# Patient Record
Sex: Male | Born: 1935 | Race: White | Hispanic: No | Smoking: Former smoker
Health system: Southern US, Community
[De-identification: ages and names within clinical notes are randomized; demographics above are authoritative.]

## PROBLEM LIST (undated history)

## (undated) DIAGNOSIS — I4892 Unspecified atrial flutter: Secondary | ICD-10-CM

## (undated) DIAGNOSIS — F32A Depression, unspecified: Secondary | ICD-10-CM

## (undated) DIAGNOSIS — D696 Thrombocytopenia, unspecified: Secondary | ICD-10-CM

## (undated) DIAGNOSIS — N184 Chronic kidney disease, stage 4 (severe): Secondary | ICD-10-CM

## (undated) DIAGNOSIS — D631 Anemia in chronic kidney disease: Secondary | ICD-10-CM

## (undated) DIAGNOSIS — F329 Major depressive disorder, single episode, unspecified: Secondary | ICD-10-CM

## (undated) DIAGNOSIS — E039 Hypothyroidism, unspecified: Secondary | ICD-10-CM

## (undated) DIAGNOSIS — N189 Chronic kidney disease, unspecified: Secondary | ICD-10-CM

## (undated) DIAGNOSIS — M109 Gout, unspecified: Secondary | ICD-10-CM

## (undated) DIAGNOSIS — D72819 Decreased white blood cell count, unspecified: Secondary | ICD-10-CM

## (undated) DIAGNOSIS — E785 Hyperlipidemia, unspecified: Secondary | ICD-10-CM

## (undated) DIAGNOSIS — N390 Urinary tract infection, site not specified: Secondary | ICD-10-CM

## (undated) DIAGNOSIS — Z8639 Personal history of other endocrine, nutritional and metabolic disease: Secondary | ICD-10-CM

## (undated) DIAGNOSIS — I350 Nonrheumatic aortic (valve) stenosis: Secondary | ICD-10-CM

## (undated) DIAGNOSIS — K635 Polyp of colon: Secondary | ICD-10-CM

## (undated) DIAGNOSIS — R3129 Other microscopic hematuria: Secondary | ICD-10-CM

## (undated) DIAGNOSIS — Z9289 Personal history of other medical treatment: Secondary | ICD-10-CM

## (undated) DIAGNOSIS — E875 Hyperkalemia: Secondary | ICD-10-CM

## (undated) DIAGNOSIS — I10 Essential (primary) hypertension: Secondary | ICD-10-CM

## (undated) DIAGNOSIS — N400 Enlarged prostate without lower urinary tract symptoms: Secondary | ICD-10-CM

## (undated) HISTORY — DX: Unspecified atrial flutter: I48.92

## (undated) HISTORY — DX: Anemia in chronic kidney disease: D63.1

## (undated) HISTORY — DX: Other microscopic hematuria: R31.29

## (undated) HISTORY — DX: Nonrheumatic aortic (valve) stenosis: I35.0

## (undated) HISTORY — DX: Hyperlipidemia, unspecified: E78.5

## (undated) HISTORY — DX: Decreased white blood cell count, unspecified: D72.819

## (undated) HISTORY — PX: EYE SURGERY: SHX253

## (undated) HISTORY — DX: Hypothyroidism, unspecified: E03.9

## (undated) HISTORY — PX: PROSTATE SURGERY: SHX751

## (undated) HISTORY — PX: COLONOSCOPY: SHX174

## (undated) HISTORY — DX: Chronic kidney disease, stage 4 (severe): N18.4

## (undated) HISTORY — DX: Thrombocytopenia, unspecified: D69.6

## (undated) HISTORY — DX: Essential (primary) hypertension: I10

## (undated) HISTORY — DX: Urinary tract infection, site not specified: N39.0

## (undated) HISTORY — DX: Depression, unspecified: F32.A

## (undated) HISTORY — PX: TRANSTHORACIC ECHOCARDIOGRAM: SHX275

## (undated) HISTORY — DX: Major depressive disorder, single episode, unspecified: F32.9

## (undated) HISTORY — DX: Chronic kidney disease, unspecified: N18.9

## (undated) HISTORY — DX: Polyp of colon: K63.5

## (undated) HISTORY — PX: TONSILLECTOMY: SUR1361

## (undated) HISTORY — DX: Personal history of other endocrine, nutritional and metabolic disease: Z86.39

## (undated) HISTORY — DX: Hyperkalemia: E87.5

## (undated) HISTORY — DX: Gout, unspecified: M10.9

---

## 1997-07-25 ENCOUNTER — Other Ambulatory Visit: Admission: RE | Admit: 1997-07-25 | Discharge: 1997-07-25 | Payer: Self-pay | Admitting: Nephrology

## 2000-03-03 ENCOUNTER — Ambulatory Visit (HOSPITAL_COMMUNITY): Admission: RE | Admit: 2000-03-03 | Discharge: 2000-03-03 | Payer: Self-pay | Admitting: *Deleted

## 2000-03-03 ENCOUNTER — Encounter (INDEPENDENT_AMBULATORY_CARE_PROVIDER_SITE_OTHER): Payer: Self-pay | Admitting: *Deleted

## 2001-01-23 ENCOUNTER — Encounter: Admission: RE | Admit: 2001-01-23 | Discharge: 2001-01-23 | Payer: Self-pay | Admitting: Nephrology

## 2001-01-23 ENCOUNTER — Encounter: Payer: Self-pay | Admitting: Nephrology

## 2001-02-08 ENCOUNTER — Encounter: Payer: Self-pay | Admitting: Nephrology

## 2001-02-08 ENCOUNTER — Encounter: Admission: RE | Admit: 2001-02-08 | Discharge: 2001-02-08 | Payer: Self-pay | Admitting: Nephrology

## 2001-03-23 ENCOUNTER — Encounter: Admission: RE | Admit: 2001-03-23 | Discharge: 2001-03-23 | Payer: Self-pay | Admitting: Nephrology

## 2001-03-23 ENCOUNTER — Encounter: Payer: Self-pay | Admitting: Nephrology

## 2003-04-19 LAB — HM COLONOSCOPY

## 2003-09-12 ENCOUNTER — Ambulatory Visit (HOSPITAL_COMMUNITY): Admission: RE | Admit: 2003-09-12 | Discharge: 2003-09-12 | Payer: Self-pay | Admitting: *Deleted

## 2003-09-12 ENCOUNTER — Encounter (INDEPENDENT_AMBULATORY_CARE_PROVIDER_SITE_OTHER): Payer: Self-pay | Admitting: Specialist

## 2004-09-07 ENCOUNTER — Emergency Department (HOSPITAL_COMMUNITY): Admission: EM | Admit: 2004-09-07 | Discharge: 2004-09-07 | Payer: Self-pay | Admitting: Emergency Medicine

## 2007-07-12 ENCOUNTER — Encounter: Admission: RE | Admit: 2007-07-12 | Discharge: 2007-07-12 | Payer: Self-pay | Admitting: Nephrology

## 2008-08-04 ENCOUNTER — Encounter: Admission: RE | Admit: 2008-08-04 | Discharge: 2008-08-04 | Payer: Self-pay | Admitting: Nephrology

## 2011-12-05 ENCOUNTER — Encounter (INDEPENDENT_AMBULATORY_CARE_PROVIDER_SITE_OTHER): Payer: Self-pay | Admitting: General Surgery

## 2011-12-05 ENCOUNTER — Ambulatory Visit (INDEPENDENT_AMBULATORY_CARE_PROVIDER_SITE_OTHER): Payer: 59 | Admitting: General Surgery

## 2011-12-05 VITALS — BP 140/60 | HR 94 | Temp 97.8°F | Ht 72.0 in | Wt 181.2 lb

## 2011-12-05 DIAGNOSIS — K4090 Unilateral inguinal hernia, without obstruction or gangrene, not specified as recurrent: Secondary | ICD-10-CM | POA: Insufficient documentation

## 2011-12-05 MED ORDER — TAMSULOSIN HCL 0.4 MG PO CAPS: 0.4000 mg | ORAL_CAPSULE | Freq: Every day | ORAL | Status: DC

## 2011-12-05 NOTE — Patient Instructions (Addendum)
Start taking the Flomax 2 days before his surgery and then 5 days after the surgery.  [facility] _______[facility]   INGUINAL HERNIA REPAIR: POST OP INSTRUCTIONS  Always review your discharge instruction sheet given to you by the facility where your surgery was performed. IF YOU HAVE DISABILITY OR FAMILY LEAVE FORMS, YOU MUST BRING THEM TO THE OFFICE FOR PROCESSING.   DO NOT GIVE THEM TO YOUR DOCTOR.  1. A  prescription for pain medication may be given to you upon discharge.  Take your pain medication as prescribed, if needed.  If narcotic pain medicine is not needed, then you may take acetaminophen (Tylenol) or ibuprofen (Advil) as needed. 2. Take your usually prescribed medications unless otherwise directed. 3. If you need a refill on your pain medication, please contact your pharmacy.  They will contact our office to request authorization. Prescriptions will not be filled after 5 pm or on week-ends. 4. You should follow a light diet the first 24 hours after arrival home, such as soup and crackers, etc.  Be sure to include lots of fluids daily.  Resume your normal diet the day after surgery. 5. Most patients will experience some swelling and bruising around the umbilicus or in the groin and scrotum.  Ice packs and reclining will help.  Swelling and bruising can take several days to resolve.  6. It is common to experience some constipation if taking pain medication after surgery.  Increasing fluid intake and taking a stool softener (such as Colace) will usually help or prevent this problem from occurring.  A mild laxative (Milk of Magnesia or Miralax) should be taken according to package directions if there are no bowel movements after 48 hours. 7. Unless discharge instructions indicate otherwise, you may remove your bandages 24-48 hours after surgery, and you may shower at that time.  You may have steri-strips (small skin tapes) in place directly over the incision.  These strips should  be left on the skin for 7-10 days.  If your surgeon used skin glue on the incision, you may shower in 24 hours.  The glue will flake off over the next 2-3 weeks.  Any sutures or staples will be removed at the office during your follow-up visit. 8. ACTIVITIES:  You may resume regular (light) daily activities beginning the next day-such as daily self-care, walking, climbing stairs-gradually increasing activities as tolerated.  You may have sexual intercourse when it is comfortable.  Refrain from any heavy lifting or straining until approved by your doctor. a. You may drive when you are no longer taking prescription pain medication, you can comfortably wear a seatbelt, and you can safely maneuver your car and apply brakes. b. RETURN TO WORK:  __________________________________________________________ 9. You should see your doctor in the office for a follow-up appointment approximately 2-3 weeks after your surgery.  Make sure that you call for this appointment within a day or two after you arrive home to insure a convenient appointment time. 10. OTHER INSTRUCTIONS:  __________________________________________________________________________________________________________________________________________________________________________________________  WHEN TO CALL YOUR DOCTOR: 1. Fever over 101.0 2. Inability to urinate 3. Nausea and/or vomiting 4. Extreme swelling or bruising 5. Continued bleeding from incision. 6. Increased pain, redness, or drainage from the incision  The clinic staff is available to answer your questions during regular business hours.  Please don't hesitate to call and ask to speak to one of the nurses for clinical concerns.  If you have a medical emergency, go to the nearest emergency room or call 911.  A surgeon from [facility] is always on call at the hospital   [address], [place], [place], [place]  [address] ?  [address], [place], [place] [phone] ? [phone] ? FAX [phone] Web site: [email or web address]

## 2011-12-05 NOTE — Progress Notes (Signed)
Patient ID: Richard Sullivan, male   DOB: [birth year], 78 y.o.   MRN: [id number]  Chief Complaint  Patient presents with  . Pre-op Exam    eval RIH    HPI Richard Sullivan is a 78 y.o. male.   HPI  He is referred by Dr. Gordy Savers for evaluation of a right inguinal hernia. 6 months ago he was doing an activity and felt a burning pain in the right groin. He then noticed some swelling. He still gets the intermittent burning pain. He is able to do most of his activities. He does not have any incarceration or obstruction type symptoms. He does have some difficulty at times starting his urinary stream.  Past Medical History  Diagnosis Date  . Hyperlipidemia   . Hypertension   . Chronic kidney disease     ckd  . Gout   . Tachyarrhythmia     History reviewed. No pertinent past surgical history.  History reviewed. No pertinent family history.  Social History History  Substance Use Topics  . Smoking status: Former Smoker    Quit date: 12/05/1982  . Smokeless tobacco: Not on file  . Alcohol Use: No    No Known Allergies  Current Outpatient Prescriptions  Medication Sig Dispense Refill  . allopurinol (ZYLOPRIM) 100 MG tablet       . amitriptyline (ELAVIL) 50 MG tablet       . aspirin 81 MG tablet Take 81 mg by mouth daily.      Marland Kitchen lisinopril (PRINIVIL,ZESTRIL) 10 MG tablet       . metoprolol succinate (TOPROL-XL) 50 MG 24 hr tablet       . ZETIA 10 MG tablet       . Tamsulosin HCl (FLOMAX) 0.4 MG CAPS Take 1 capsule (0.4 mg total) by mouth daily.  7 capsule  0    Review of Systems Review of Systems  Constitutional: Negative.   Respiratory: Negative.   Cardiovascular: Negative.   Genitourinary: Positive for difficulty urinating.  Neurological: Negative.   Hematological: Does not bruise/bleed easily.    Blood pressure 140/60, pulse 94, temperature 97.8 F (36.6 C), temperature source Temporal, height 6' (1.829 m), weight 181 lb 3.2 oz (82.192 kg), SpO2 97.00%.  Physical  Exam Physical Exam  Constitutional: He appears well-developed and well-nourished. No distress.  HENT:  Head: Normocephalic and atraumatic.  Neck: Neck supple.  Cardiovascular: Normal rate and regular rhythm.   Pulmonary/Chest: Effort normal and breath sounds normal.  Abdominal: Soft. He exhibits no distension and no mass. There is no tenderness.  Genitourinary:       Reducible right inguinal bulge.  No left inguinal bulge.  Testicles feel normal.  Musculoskeletal: He exhibits no edema.    Data Reviewed None  Assessment    Symptomatic right inguinal hernia    Plan    Right inguinal hernia repair with mesh.  Perioperative Flomax.  I have explained the procedure, risks, and aftercare of inguinal hernia repair.  Risks include but are not limited to bleeding, infection, wound problems, anesthesia, recurrence, bladder or intestine injury, urinary retention, testicular dysfunction, chronic pain, mesh problems.  He seems to understand and agrees to proceed.       Trudy Kory J 12/05/2011, 5:56 PM

## 2011-12-13 ENCOUNTER — Encounter (HOSPITAL_COMMUNITY): Payer: Self-pay | Admitting: Respiratory Therapy

## 2011-12-20 ENCOUNTER — Encounter (HOSPITAL_COMMUNITY)
Admission: RE | Admit: 2011-12-20 | Discharge: 2011-12-20 | Disposition: A | Discharge status: Home / Self Care | Payer: Medicare Other | Source: Ambulatory Visit | Attending: General Surgery | Admitting: General Surgery

## 2011-12-20 ENCOUNTER — Encounter (HOSPITAL_COMMUNITY): Payer: Self-pay

## 2011-12-20 HISTORY — DX: Benign prostatic hyperplasia without lower urinary tract symptoms: N40.0

## 2011-12-20 LAB — CBC WITH DIFFERENTIAL/PLATELET
Band Neutrophils: 0 % (ref 0–10)
Basophils Absolute: 0 10*3/uL (ref 0.0–0.1)
Basophils Relative: 1 % (ref 0–1)
Blasts: 0 %
Eosinophils Absolute: 0.2 10*3/uL (ref 0.0–0.7)
Eosinophils Relative: 6 % — ABNORMAL HIGH (ref 0–5)
HCT: 42.6 % (ref 39.0–52.0)
Hemoglobin: 14.9 g/dL (ref 13.0–17.0)
Lymphocytes Relative: 50 % — ABNORMAL HIGH (ref 12–46)
Lymphs Abs: 1.5 10*3/uL (ref 0.7–4.0)
MCH: 32.3 pg (ref 26.0–34.0)
MCHC: 35 g/dL (ref 30.0–36.0)
MCV: 92.2 fL (ref 78.0–100.0)
Metamyelocytes Relative: 0 %
Monocytes Absolute: 0.5 10*3/uL (ref 0.1–1.0)
Monocytes Relative: 18 % — ABNORMAL HIGH (ref 3–12)
Myelocytes: 0 %
Neutro Abs: 0.7 10*3/uL — ABNORMAL LOW (ref 1.7–7.7)
Neutrophils Relative %: 25 % — ABNORMAL LOW (ref 43–77)
Platelets: 159 10*3/uL (ref 150–400)
Promyelocytes Absolute: 0 %
RBC: 4.62 MIL/uL (ref 4.22–5.81)
RDW: 13.9 % (ref 11.5–15.5)
WBC: 2.9 10*3/uL — ABNORMAL LOW (ref 4.0–10.5)
nRBC: 0 /100 WBC

## 2011-12-20 LAB — COMPREHENSIVE METABOLIC PANEL
ALT: 16 U/L (ref 0–53)
AST: 23 U/L (ref 0–37)
Albumin: 3.4 g/dL — ABNORMAL LOW (ref 3.5–5.2)
Alkaline Phosphatase: 74 U/L (ref 39–117)
BUN: 37 mg/dL — ABNORMAL HIGH (ref 6–23)
CO2: 22 mEq/L (ref 19–32)
Calcium: 9.9 mg/dL (ref 8.4–10.5)
Chloride: 106 mEq/L (ref 96–112)
Creatinine, Ser: 2.18 mg/dL — ABNORMAL HIGH (ref 0.50–1.35)
GFR calc Af Amer: 31 mL/min — ABNORMAL LOW (ref >90)
GFR calc non Af Amer: 27 mL/min — ABNORMAL LOW (ref >90)
Glucose, Bld: 113 mg/dL — ABNORMAL HIGH (ref 70–99)
Potassium: 5 mEq/L (ref 3.5–5.1)
Sodium: 138 mEq/L (ref 135–145)
Total Bilirubin: 0.6 mg/dL (ref 0.3–1.2)
Total Protein: 6.4 g/dL (ref 6.0–8.3)

## 2011-12-20 LAB — SURGICAL PCR SCREEN
MRSA, PCR: NEGATIVE
Staphylococcus aureus: NEGATIVE

## 2011-12-20 LAB — PROTIME-INR
INR: 1.04 (ref 0.00–1.49)
Prothrombin Time: 13.8 seconds (ref 11.6–15.2)

## 2011-12-20 NOTE — Pre-Procedure Instructions (Signed)
[place]  12/20/2011   Your procedure is scheduled on:  Friday, Sept 6th  Report to [facility] at 1100 AM.  Call this number if you have problems the morning of surgery: [phone]   Remember:   Do not eat food or drink:After Midnight.  Take these medicines the morning of surgery with A SIP OF WATER: toprol, allopurinol, flomax   Do not wear jewelry..  Do not wear lotions, powders, or perfumes.  Do not shave 48 hours prior to surgery. Men may shave face and neck.  Do not bring valuables to the hospital.  Contacts, dentures or bridgework may not be worn into surgery.  Leave suitcase in the car. After surgery it may be brought to your room.  For patients admitted to the hospital, checkout time is 11:00 AM the day of discharge.   Patients discharged the day of surgery will not be allowed to drive home.  Special Instructions: CHG Shower Use Special Wash: 1/2 bottle night before surgery and 1/2 bottle morning of surgery.   Please read over the following fact sheets that you were given: Pain Booklet, Coughing and Deep Breathing, MRSA Information and Surgical Site Infection Prevention

## 2011-12-20 NOTE — Progress Notes (Signed)
Dr. Mare Ferrari is Primary Physician - Does not have a cardiologist.  EKG in August 2013 - will request records

## 2011-12-21 LAB — PATHOLOGIST SMEAR REVIEW

## 2011-12-21 NOTE — Consult Note (Addendum)
Anesthesia chart review: Patient is a 78 year old male scheduled for right inguinal hernia repair with mesh on 12/27/2011 (recently moved from 12/23/11) by Dr. Zella Richer.  History includes former smoker, hyperlipidemia, hypertension, gout, chronic kidney disease, BPH, tachyarrhythmia.  PCP is Dr. Grant Fontana who referred patient to [facility].  Patient is not followed by a Nephrologist or Cardiologist.  CXR on 12/20/11 showed: The cardiac silhouette, mediastinal and hilar contours  are normal. The lungs are clear of acute process. Bilateral  nipple shadows are noted. A stable moderate sized hiatal hernia is again demonstrated. The bony thorax is intact.   He reported an EKG from August 2013 which was requested at his PAT appointment, but is still pending.  Labs noted.  BUN/Cr 37/2.18.  Glucose 113.  PT/INR WNL.  WBC 2.9.  Smear showed "marked absolute neutropenia without apparent cause."  H/H 14.9/42.6.   Currently, there are no comparison labs available, but records were requested from Dr. Mare Ferrari on 12/20/11.  I called Cr and CBC results to Hinton Dyer at [facility] (to forward to his nurse Jearld Fenton) and routed labs to Dr. Zella Richer.  I've asked that his staff notify Dr. Mare Ferrari of patient's lab results.  I reviewed labs with Anesthesiologist Dr. Glennon Mac.  We'll defer additional orders/recommendations to Dr. Zella Richer and/or Dr. Mare Ferrari.  Myra Gianotti, PA-C  12/21/11 1655  Addendum: 12/22/11 1545 I received a voice message from Dr. Zella Richer.  He is okay to proceed with current lab values.  Labs faxed to Dr. Mare Ferrari at [phone] with confirmation received.

## 2011-12-22 NOTE — Progress Notes (Signed)
Patient aware of date and time change for surgery. Patient to arrive at 09:15.

## 2011-12-22 NOTE — Progress Notes (Signed)
Requested records from Dr. Peterson [name] office

## 2011-12-26 MED ORDER — CEFAZOLIN SODIUM-DEXTROSE 2-3 GM-% IV SOLR
2.0000 g | INTRAVENOUS | Status: AC
  Administered 2011-12-27: 2 g via INTRAVENOUS
  Filled 2011-12-26 (×2): qty 50

## 2011-12-27 ENCOUNTER — Encounter (HOSPITAL_COMMUNITY)
Admission: RE | Disposition: A | Discharge status: Home / Self Care | Payer: Self-pay | Source: Ambulatory Visit | Attending: General Surgery

## 2011-12-27 ENCOUNTER — Ambulatory Visit (HOSPITAL_COMMUNITY): Payer: Medicare Other | Admitting: Vascular Surgery

## 2011-12-27 ENCOUNTER — Encounter (HOSPITAL_COMMUNITY): Payer: Self-pay | Admitting: Vascular Surgery

## 2011-12-27 ENCOUNTER — Ambulatory Visit (HOSPITAL_COMMUNITY)
Admission: RE | Admit: 2011-12-27 | Discharge: 2011-12-27 | Disposition: A | Discharge status: Home / Self Care | Payer: Medicare Other | Source: Ambulatory Visit | Attending: General Surgery | Admitting: General Surgery

## 2011-12-27 ENCOUNTER — Encounter (HOSPITAL_COMMUNITY): Payer: Self-pay

## 2011-12-27 DIAGNOSIS — N189 Chronic kidney disease, unspecified: Secondary | ICD-10-CM | POA: Insufficient documentation

## 2011-12-27 DIAGNOSIS — K4090 Unilateral inguinal hernia, without obstruction or gangrene, not specified as recurrent: Secondary | ICD-10-CM | POA: Insufficient documentation

## 2011-12-27 DIAGNOSIS — E785 Hyperlipidemia, unspecified: Secondary | ICD-10-CM | POA: Insufficient documentation

## 2011-12-27 DIAGNOSIS — Z01812 Encounter for preprocedural laboratory examination: Secondary | ICD-10-CM | POA: Insufficient documentation

## 2011-12-27 DIAGNOSIS — I129 Hypertensive chronic kidney disease with stage 1 through stage 4 chronic kidney disease, or unspecified chronic kidney disease: Secondary | ICD-10-CM | POA: Insufficient documentation

## 2011-12-27 DIAGNOSIS — Z01818 Encounter for other preprocedural examination: Secondary | ICD-10-CM | POA: Insufficient documentation

## 2011-12-27 HISTORY — DX: Personal history of other medical treatment: Z92.89

## 2011-12-27 HISTORY — PX: INGUINAL HERNIA REPAIR: SHX194

## 2011-12-27 SURGERY — REPAIR, HERNIA, INGUINAL, ADULT
Anesthesia: General | Site: Groin | Laterality: Right | Wound class: Clean

## 2011-12-27 MED ORDER — PROPOFOL 10 MG/ML IV BOLUS
INTRAVENOUS | Status: DC | PRN
  Administered 2011-12-27: 150 mg via INTRAVENOUS

## 2011-12-27 MED ORDER — ONDANSETRON HCL 4 MG/2ML IJ SOLN
INTRAMUSCULAR | Status: DC | PRN
  Administered 2011-12-27: 4 mg via INTRAVENOUS

## 2011-12-27 MED ORDER — LACTATED RINGERS IV SOLN
INTRAVENOUS | Status: DC
  Administered 2011-12-27: 11:00:00 via INTRAVENOUS

## 2011-12-27 MED ORDER — EPHEDRINE SULFATE 50 MG/ML IJ SOLN
INTRAMUSCULAR | Status: DC | PRN
  Administered 2011-12-27: 15 mg via INTRAVENOUS
  Administered 2011-12-27: 10 mg via INTRAVENOUS

## 2011-12-27 MED ORDER — BUPIVACAINE-EPINEPHRINE (PF) 0.5% -1:200000 IJ SOLN
INTRAMUSCULAR | Status: AC
  Filled 2011-12-27: qty 10

## 2011-12-27 MED ORDER — PHENYLEPHRINE HCL 10 MG/ML IJ SOLN
INTRAMUSCULAR | Status: DC | PRN
  Administered 2011-12-27: 120 ug via INTRAVENOUS
  Administered 2011-12-27: 80 ug via INTRAVENOUS
  Administered 2011-12-27: 160 ug via INTRAVENOUS
  Administered 2011-12-27: 40 ug via INTRAVENOUS

## 2011-12-27 MED ORDER — FENTANYL CITRATE 0.05 MG/ML IJ SOLN
INTRAMUSCULAR | Status: DC | PRN
  Administered 2011-12-27: 100 ug via INTRAVENOUS
  Administered 2011-12-27 (×3): 50 ug via INTRAVENOUS

## 2011-12-27 MED ORDER — MIDAZOLAM HCL 5 MG/5ML IJ SOLN
INTRAMUSCULAR | Status: DC | PRN
  Administered 2011-12-27 (×2): 1 mg via INTRAVENOUS

## 2011-12-27 MED ORDER — 0.9 % SODIUM CHLORIDE (POUR BTL) OPTIME
TOPICAL | Status: DC | PRN
  Administered 2011-12-27: 1000 mL

## 2011-12-27 MED ORDER — OXYCODONE HCL 5 MG PO TABS
5.0000 mg | ORAL_TABLET | Freq: Once | ORAL | Status: DC | PRN
Start: 2011-12-27 — End: 2011-12-27

## 2011-12-27 MED ORDER — DROPERIDOL 2.5 MG/ML IJ SOLN: 0.6250 mg | INTRAMUSCULAR | Status: DC | PRN

## 2011-12-27 MED ORDER — OXYCODONE HCL 5 MG/5ML PO SOLN: 5.0000 mg | Freq: Once | ORAL | Status: DC | PRN

## 2011-12-27 MED ORDER — SODIUM CHLORIDE 0.9 % IV SOLN
INTRAVENOUS | Status: DC | PRN
  Administered 2011-12-27 (×2): via INTRAVENOUS

## 2011-12-27 MED ORDER — BUPIVACAINE-EPINEPHRINE 0.5% -1:200000 IJ SOLN
INTRAMUSCULAR | Status: DC | PRN
  Administered 2011-12-27: 30 mL

## 2011-12-27 MED ORDER — HYDROMORPHONE HCL PF 1 MG/ML IJ SOLN: 0.2500 mg | INTRAMUSCULAR | Status: DC | PRN

## 2011-12-27 MED ORDER — OXYCODONE-ACETAMINOPHEN 5-325 MG PO TABS: 1.0000 | ORAL_TABLET | ORAL | Status: AC | PRN

## 2011-12-27 MED ORDER — LIDOCAINE HCL (CARDIAC) 20 MG/ML IV SOLN
INTRAVENOUS | Status: DC | PRN
  Administered 2011-12-27: 80 mg via INTRAVENOUS

## 2011-12-27 MED ORDER — SODIUM CHLORIDE 0.9 % IV SOLN
10.0000 mg | INTRAVENOUS | Status: DC | PRN
  Administered 2011-12-27: 40 ug/min via INTRAVENOUS

## 2011-12-27 SURGICAL SUPPLY — 44 items
BENZOIN TINCTURE PRP APPL 2/3 (GAUZE/BANDAGES/DRESSINGS) ×2 IMPLANT
BLADE SURG 10 STRL SS (BLADE) ×2 IMPLANT
BLADE SURG 15 STRL LF DISP TIS (BLADE) ×1 IMPLANT
BLADE SURG 15 STRL SS (BLADE) ×1
BLADE SURG ROTATE 9660 (MISCELLANEOUS) ×2 IMPLANT
CHLORAPREP W/TINT 26ML (MISCELLANEOUS) ×2 IMPLANT
CLOTH BEACON ORANGE TIMEOUT ST (SAFETY) ×2 IMPLANT
COVER SURGICAL LIGHT HANDLE (MISCELLANEOUS) ×2 IMPLANT
DRAIN PENROSE 1/2X12 LTX STRL (WOUND CARE) ×2 IMPLANT
DRAPE INCISE IOBAN 66X45 STRL (DRAPES) ×2 IMPLANT
DRAPE LAPAROTOMY TRNSV 102X78 (DRAPE) ×2 IMPLANT
DRESSING TELFA 8X3 (GAUZE/BANDAGES/DRESSINGS) IMPLANT
DRSG OPSITE 6X11 MED (GAUZE/BANDAGES/DRESSINGS) IMPLANT
ELECT CAUTERY BLADE 6.4 (BLADE) ×2 IMPLANT
ELECT REM PT RETURN 9FT ADLT (ELECTROSURGICAL) ×2
ELECTRODE REM PT RTRN 9FT ADLT (ELECTROSURGICAL) ×1 IMPLANT
GLOVE BIOGEL PI IND STRL 6.5 (GLOVE) ×1 IMPLANT
GLOVE BIOGEL PI IND STRL 8 (GLOVE) ×1 IMPLANT
GLOVE BIOGEL PI INDICATOR 6.5 (GLOVE) ×1
GLOVE BIOGEL PI INDICATOR 8 (GLOVE) ×1
GLOVE ECLIPSE 6.5 STRL STRAW (GLOVE) ×2 IMPLANT
GLOVE ECLIPSE 8.0 STRL XLNG CF (GLOVE) ×4 IMPLANT
GOWN STRL NON-REIN LRG LVL3 (GOWN DISPOSABLE) ×6 IMPLANT
KIT BASIN OR (CUSTOM PROCEDURE TRAY) ×2 IMPLANT
KIT ROOM TURNOVER OR (KITS) ×2 IMPLANT
MESH HERNIA 3X6 (Mesh General) ×2 IMPLANT
NEEDLE HYPO 25GX1X1/2 BEV (NEEDLE) ×2 IMPLANT
NS IRRIG 1000ML POUR BTL (IV SOLUTION) ×2 IMPLANT
PACK SURGICAL SETUP 50X90 (CUSTOM PROCEDURE TRAY) ×2 IMPLANT
PAD ARMBOARD 7.5X6 YLW CONV (MISCELLANEOUS) ×2 IMPLANT
PENCIL BUTTON HOLSTER BLD 10FT (ELECTRODE) ×2 IMPLANT
SPECIMEN JAR SMALL (MISCELLANEOUS) IMPLANT
SPONGE LAP 18X18 X RAY DECT (DISPOSABLE) ×2 IMPLANT
STRIP CLOSURE SKIN 1/2X4 (GAUZE/BANDAGES/DRESSINGS) ×2 IMPLANT
SUT MON AB 4-0 PC3 18 (SUTURE) ×2 IMPLANT
SUT PROLENE 2 0 CT2 30 (SUTURE) ×4 IMPLANT
SUT SILK 2 0 SH (SUTURE) IMPLANT
SUT VIC AB 2-0 SH 18 (SUTURE) ×2 IMPLANT
SUT VIC AB 3-0 SH 27 (SUTURE) ×2
SUT VIC AB 3-0 SH 27XBRD (SUTURE) ×2 IMPLANT
SUT VICRYL AB 3 0 TIES (SUTURE) ×2 IMPLANT
SYR CONTROL 10ML LL (SYRINGE) ×2 IMPLANT
TOWEL OR 17X24 6PK STRL BLUE (TOWEL DISPOSABLE) ×2 IMPLANT
TOWEL OR 17X26 10 PK STRL BLUE (TOWEL DISPOSABLE) ×2 IMPLANT

## 2011-12-27 NOTE — Interval H&P Note (Signed)
History and Physical Interval Note:  12/27/2011 11:00 AM  Richard Sullivan  has presented today for surgery, with the diagnosis of Right inguinal hernia  The various methods of treatment have been discussed with the patient and family. After consideration of risks, benefits and other options for treatment, the patient has consented to  Procedure(s) (LRB) with comments: HERNIA REPAIR INGUINAL ADULT (Right) INSERTION OF MESH (Right) as a surgical intervention .  The patient's history has been reviewed, patient examined, no change in status, stable for surgery.  I have reviewed the patient's chart and labs.  Questions were answered to the patient's satisfaction.     Arjun Hard Lenna Sciara

## 2011-12-27 NOTE — H&P (View-Only) (Signed)
Patient ID: Richard Sullivan, male   DOB: 1930/12/29, 78 y.o.   MRN: [id number]  Chief Complaint  Patient presents with  . Pre-op Exam    eval RIH    HPI Richard Sullivan is a 78 y.o. male.   HPI  He is referred by Dr. Gordy Savers for evaluation of a right inguinal hernia. 6 months ago he was doing an activity and felt a burning pain in the right groin. He then noticed some swelling. He still gets the intermittent burning pain. He is able to do most of his activities. He does not have any incarceration or obstruction type symptoms. He does have some difficulty at times starting his urinary stream.  Past Medical History  Diagnosis Date  . Hyperlipidemia   . Hypertension   . Chronic kidney disease     ckd  . Gout   . Tachyarrhythmia     History reviewed. No pertinent past surgical history.  History reviewed. No pertinent family history.  Social History History  Substance Use Topics  . Smoking status: Former Smoker    Quit date: 12/05/1982  . Smokeless tobacco: Not on file  . Alcohol Use: No    No Known Allergies  Current Outpatient Prescriptions  Medication Sig Dispense Refill  . allopurinol (ZYLOPRIM) 100 MG tablet       . amitriptyline (ELAVIL) 50 MG tablet       . aspirin 81 MG tablet Take 81 mg by mouth daily.      Marland Kitchen lisinopril (PRINIVIL,ZESTRIL) 10 MG tablet       . metoprolol succinate (TOPROL-XL) 50 MG 24 hr tablet       . ZETIA 10 MG tablet       . Tamsulosin HCl (FLOMAX) 0.4 MG CAPS Take 1 capsule (0.4 mg total) by mouth daily.  7 capsule  0    Review of Systems Review of Systems  Constitutional: Negative.   Respiratory: Negative.   Cardiovascular: Negative.   Genitourinary: Positive for difficulty urinating.  Neurological: Negative.   Hematological: Does not bruise/bleed easily.    Blood pressure 140/60, pulse 94, temperature 97.8 F (36.6 C), temperature source Temporal, height 6' (1.829 m), weight 181 lb 3.2 oz (82.192 kg), SpO2 97.00%.  Physical  Exam Physical Exam  Constitutional: He appears well-developed and well-nourished. No distress.  HENT:  Head: Normocephalic and atraumatic.  Neck: Neck supple.  Cardiovascular: Normal rate and regular rhythm.   Pulmonary/Chest: Effort normal and breath sounds normal.  Abdominal: Soft. He exhibits no distension and no mass. There is no tenderness.  Genitourinary:       Reducible right inguinal bulge.  No left inguinal bulge.  Testicles feel normal.  Musculoskeletal: He exhibits no edema.    Data Reviewed None  Assessment    Symptomatic right inguinal hernia    Plan    Right inguinal hernia repair with mesh.  Perioperative Flomax.  I have explained the procedure, risks, and aftercare of inguinal hernia repair.  Risks include but are not limited to bleeding, infection, wound problems, anesthesia, recurrence, bladder or intestine injury, urinary retention, testicular dysfunction, chronic pain, mesh problems.  He seems to understand and agrees to proceed.       Richard Sullivan 12/05/2011, 5:56 PM

## 2011-12-27 NOTE — Preoperative (Signed)
Beta Blockers   Reason not to administer Beta Blockers:Not Applicable 

## 2011-12-27 NOTE — Anesthesia Procedure Notes (Signed)
Procedure Name: LMA Insertion Date/Time: 12/27/2011 11:41 AM Performed by: [name] Pre-anesthesia Checklist: Patient identified, Emergency Drugs available, Suction available, Patient being monitored and Timeout performed Patient Re-evaluated:Patient Re-evaluated prior to inductionOxygen Delivery Method: Circle system utilized Preoxygenation: Pre-oxygenation with 100% oxygen Intubation Type: IV induction LMA Size: 5.0 Number of attempts: 1 Placement Confirmation: positive ETCO2 and breath sounds checked- equal and bilateral Tube secured with: Tape Dental Injury: Teeth and Oropharynx as per pre-operative assessment

## 2011-12-27 NOTE — Op Note (Signed)
Preoperative diagnosis:  Right inguinal hernia.  Postoperative diagnosis:  [place] right inguinal hernia  Procedure:  Right inguinal hernia repair with mesh.  Surgeon:  Jackolyn Confer, M.D.  Asst:  Lodema Hong, PA-S  Anesthesia:  General/LMA with local (Marcaine).  Indication:  This is a 78 year old man with some right groin pain and swelling. On exam he has a hernia and now presents for repair. The procedure, risks, and after care were discussed with him preoperatively.  Technique:  He was seen in the holding room and the right groin was marked with my initials. He was brought to the operating, placed supine on the operating table, and the anesthetic was administered by the anesthesiologist. The hair in the groin area was clipped as was felt to be necessary. This area was then sterilely prepped and draped.  Local anesthetic was infiltrated in the superficial and deep tissues in the right groin.  An incision was made through the skin and subcutaneous tissue until the external oblique aponeurosis was identified.  Local anesthetic was infiltrated deep to the external oblique aponeurosis. The external oblique aponeurosis was divided through the external ring medially and back toward the anterior superior iliac spine laterally. Using blunt dissection, the shelving edge of the inguinal ligament was identified inferiorly and the internal oblique aponeurosis and muscle were identified superiorly. The ilioinguinal nerve was identified and preserved.  The spermatic cord was isolated and a posterior window was made around it. A direct hernia sac was identified and separated from the spermatic cord using blunt dissection.  A indirect hernia was also noted. Extraperitoneal fat was dissected free from the cord coming from the indirect hernia and partially excised. The hernia sacs were reduced and the attenuated transversalis fascia was closed with running 2-0 Vicryl suture to help reduce the direct  hernia contents.   A piece of 3" x 6" polypropylene mesh was brought into the field and anchored 1-2 cm medial to the pubic tubercle with 2-0 Prolene suture. The inferior aspect of the mesh was anchored to the shelving edge of the inguinal ligament with running 2-0 Prolene suture to a level 1-2 cm lateral to the internal ring. A slit was cut in the mesh creating 2 tails. These were wrapped around the spermatic cord. The superior aspect of the mesh was anchored to the internal oblique aponeurosis and muscle with interrupted 2-0 Vicryl sutures. The 2 tails of the mesh were then crossed creating a new internal ring and were anchored to the shelving edge of the inguinal ligament with 2-0 Prolene suture. The tip of a hemostat could be placed through the new aperture. The lateral aspect of the mesh was then tucked deep to the external oblique aponeurosis.  The wound was inspected and hemostasis was adequate. The external oblique aponeurosis was then closed over the mesh and cord with running 3-0 Vicryl suture. The subcutaneous tissue was closed with running 3-0 Vicryl suture. The skin closed with a running 4-0 Monocryl subcuticular stitch.  Steri-Strips and a sterile dressing were applied.  The procedure was well-tolerated without any apparent complications and the patient was taken to the recovery room in satisfactory condition.

## 2011-12-27 NOTE — Anesthesia Preprocedure Evaluation (Signed)
Anesthesia Evaluation  Patient identified by MRN, date of birth, ID band Patient awake    Reviewed: Allergy & Precautions, H&P , NPO status , Patient's Chart, lab work & pertinent test results, reviewed documented beta blocker date and time   History of Anesthesia Complications Negative for: history of anesthetic complications  Airway  TM Distance: >3 FB   Mouth opening: Limited Mouth Opening  Dental  (+) Edentulous Upper and Edentulous Lower   Pulmonary neg pulmonary ROS,  breath sounds clear to auscultation  Pulmonary exam normal       Cardiovascular hypertension, Pt. on home beta blockers Rhythm:Regular Rate:Normal     Neuro/Psych negative neurological ROS  negative psych ROS   GI/Hepatic negative GI ROS, Neg liver ROS,   Endo/Other  negative endocrine ROS  Renal/GU Renal InsufficiencyRenal disease     Musculoskeletal   Abdominal   Peds  Hematology   Anesthesia Other Findings   Reproductive/Obstetrics                           Anesthesia Physical Anesthesia Plan  ASA: III  Anesthesia Plan: General   Post-op Pain Management:    Induction:   Airway Management Planned: LMA and Oral ETT  Additional Equipment:   Intra-op Plan:   Post-operative Plan: Extubation in OR  Informed Consent: I have reviewed the patients History and Physical, chart, labs and discussed the procedure including the risks, benefits and alternatives for the proposed anesthesia with the patient or authorized representative who has indicated his/her understanding and acceptance.   Dental advisory given  Plan Discussed with: CRNA, Anesthesiologist and Surgeon  Anesthesia Plan Comments:         Anesthesia Quick Evaluation

## 2011-12-27 NOTE — Transfer of Care (Signed)
Immediate Anesthesia Transfer of Care Note  Patient: Richard Sullivan  Procedure(s) Performed: Procedure(s) (LRB) with comments: HERNIA REPAIR INGUINAL ADULT (Right) INSERTION OF MESH (Right)  Patient Location: PACU  Anesthesia Type: General  Level of Consciousness: awake, alert  and oriented  Airway & Oxygen Therapy: Patient Spontanous Breathing and Patient connected to nasal cannula oxygen  Post-op Assessment: Report given to PACU RN and Post -op Vital signs reviewed and stable  Post vital signs: Reviewed and stable  Complications: No apparent anesthesia complications

## 2011-12-28 NOTE — Anesthesia Postprocedure Evaluation (Signed)
Anesthesia Post Note  Patient: Richard Sullivan  Procedure(s) Performed: Procedure(s) (LRB): HERNIA REPAIR INGUINAL ADULT (Right) INSERTION OF MESH (Right)  Anesthesia type: general  Patient location: PACU  Post pain: Pain level controlled  Post assessment: Patient's Cardiovascular Status Stable  Last Vitals:  Filed Vitals:   12/27/11 1348  BP: 126/67  Pulse: 87  Temp: 36.7 C  Resp: 18    Post vital signs: Reviewed and stable  Level of consciousness: sedated  Complications: No apparent anesthesia complications

## 2011-12-29 ENCOUNTER — Encounter (HOSPITAL_COMMUNITY): Payer: Self-pay | Admitting: General Surgery

## 2012-01-16 ENCOUNTER — Encounter (INDEPENDENT_AMBULATORY_CARE_PROVIDER_SITE_OTHER): Payer: Self-pay | Admitting: General Surgery

## 2012-01-16 ENCOUNTER — Ambulatory Visit (INDEPENDENT_AMBULATORY_CARE_PROVIDER_SITE_OTHER): Payer: Medicare Other | Admitting: General Surgery

## 2012-01-16 VITALS — BP 124/62 | HR 108 | Temp 97.6°F | Resp 16 | Ht 72.0 in | Wt 179.4 lb

## 2012-01-16 DIAGNOSIS — Z9889 Other specified postprocedural states: Secondary | ICD-10-CM

## 2012-01-16 NOTE — Progress Notes (Signed)
He presents for postop followup after open Right inguinal hernia repair with mesh.  Post op pain is improving.  No difficulty voiding or having BMs.  Swelling is decreasing.  P.E. GU:  Right groin incision clean/dry/intact, swelling is minimal, repair is solid.  Assessment:  Doing well post hernia repair.  Plan:  Continue light activities for 6 weeks postop then slowly start to resume normal activities. Avoid activities that cause significant discomfort for the long term.  Return visit as needed.

## 2012-01-16 NOTE — Patient Instructions (Signed)
Resume normal activities 3 weeks from now as we discussed.

## 2012-08-20 ENCOUNTER — Encounter: Payer: Self-pay | Admitting: Family Medicine

## 2012-08-20 ENCOUNTER — Ambulatory Visit (INDEPENDENT_AMBULATORY_CARE_PROVIDER_SITE_OTHER): Payer: Medicare Other | Admitting: Family Medicine

## 2012-08-20 VITALS — BP 130/64 | HR 78 | Temp 98.6°F | Ht 70.5 in | Wt 179.5 lb

## 2012-08-20 DIAGNOSIS — I10 Essential (primary) hypertension: Secondary | ICD-10-CM

## 2012-08-20 DIAGNOSIS — N183 Chronic kidney disease, stage 3 unspecified: Secondary | ICD-10-CM | POA: Insufficient documentation

## 2012-08-20 DIAGNOSIS — M109 Gout, unspecified: Secondary | ICD-10-CM

## 2012-08-20 DIAGNOSIS — R011 Cardiac murmur, unspecified: Secondary | ICD-10-CM

## 2012-08-20 DIAGNOSIS — E785 Hyperlipidemia, unspecified: Secondary | ICD-10-CM

## 2012-08-20 DIAGNOSIS — N189 Chronic kidney disease, unspecified: Secondary | ICD-10-CM

## 2012-08-20 MED ORDER — ALLOPURINOL 100 MG PO TABS: 100.0000 mg | ORAL_TABLET | Freq: Every day | ORAL | Status: DC

## 2012-08-20 MED ORDER — METOPROLOL SUCCINATE ER 50 MG PO TB24: 50.0000 mg | ORAL_TABLET | Freq: Every day | ORAL | Status: DC

## 2012-08-20 MED ORDER — TAMSULOSIN HCL 0.4 MG PO CAPS: 0.4000 mg | ORAL_CAPSULE | Freq: Every day | ORAL | Status: DC

## 2012-08-20 MED ORDER — EZETIMIBE 10 MG PO TABS: 10.0000 mg | ORAL_TABLET | Freq: Every day | ORAL | Status: DC

## 2012-08-20 MED ORDER — AMITRIPTYLINE HCL 50 MG PO TABS: 50.0000 mg | ORAL_TABLET | Freq: Every day | ORAL | Status: DC

## 2012-08-20 MED ORDER — LISINOPRIL 10 MG PO TABS: 10.0000 mg | ORAL_TABLET | Freq: Every day | ORAL | Status: DC

## 2012-08-20 NOTE — Assessment & Plan Note (Signed)
Will obtain FLP at pt's earliest convenience.

## 2012-08-20 NOTE — Progress Notes (Signed)
Office Note 08/20/2012  CC:  Chief Complaint  Patient presents with  . Establish Care    HPI:  Richard Sullivan is a 79 y.o. White male who is here to establish care. Patient's most recent primary MD: Dr. Juanda Crumble Frazier---recently retired/moved.  GI MD--pt doesn't recall but thinks maybe [facility]? Old records in EPIC/HL EMR were reviewed prior to or during today's visit.  Reviewed medical history.  No acute complaints.  He is happy and asks for his meds to be RFd today. He doesn't recall the last time he had any blood work done but thinks it has been about a year possibly.  Past Medical History  Diagnosis Date  . Hyperlipidemia   . Chronic kidney disease     ckd (? nephrotic syndrome in the 1990s)  . Gout   . Tachyarrhythmia   . Hypertension     takes meds daily  . BPH (benign prostatic hyperplasia)   . H/O exercise stress test     about 5 yrs. ago, saw cardiologist, had normal stress test, told NO need  to f/u /w cardiologist   . Depression   . History of anemia   . Phlebitis   . Colon polyps     Colonoscopy x 3 per pt--initial one with polyp but two 5 yr f/u colonoscopies normal per pt.  . Heart murmur, systolic     Past Surgical History  Procedure Laterality Date  . Prostate surgery      TURP 1980s/90s.--HELPED.  Marland Kitchen Eye surgery      cataract - right  . Colonoscopy    . Inguinal hernia repair  12/27/2011    Procedure: HERNIA REPAIR INGUINAL ADULT;  Surgeon: Odis Hollingshead, MD;  Location: [facility];  Service: General;  Laterality: Right;  . Tonsillectomy      Family History  Problem Relation Age of Onset  . Arthritis Mother   . Hypertension Father     History   Social History  . Marital Status: Married    Spouse Name: N/A    Number of Children: N/A  . Years of Education: N/A   Occupational History  . Not on file.   Social History Main Topics  . Smoking status: Former Smoker    Quit date: 12/05/1982  . Smokeless tobacco: Not on file  . Alcohol  Use: No  . Drug Use: No  . Sexually Active:    Other Topics Concern  . Not on file   Social History Narrative   Married, has one son.   Orig from [place], [place].   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   [employer] education.   Tob 30 pack-yr hx, quit 1984.   Alcohol-none    Drugso-none   Likes to hunt and fish.  Still mows yard with push mower.    Outpatient Encounter Prescriptions as of 08/20/2012  Medication Sig Dispense Refill  . allopurinol (ZYLOPRIM) 100 MG tablet Take 1 tablet (100 mg total) by mouth daily.  90 tablet  3  . amitriptyline (ELAVIL) 50 MG tablet Take 1 tablet (50 mg total) by mouth at bedtime.  90 tablet  3  . aspirin 81 MG tablet Take 81 mg by mouth daily.      Marland Kitchen ezetimibe (ZETIA) 10 MG tablet Take 1 tablet (10 mg total) by mouth daily.  90 tablet  3  . lisinopril (PRINIVIL,ZESTRIL) 10 MG tablet Take 1 tablet (10 mg total) by mouth daily.  90 tablet  3  . metoprolol succinate (  TOPROL-XL) 50 MG 24 hr tablet Take 1 tablet (50 mg total) by mouth daily.  90 tablet  3  . tamsulosin (FLOMAX) 0.4 MG CAPS Take 1 capsule (0.4 mg total) by mouth daily.  90 capsule  3  . [DISCONTINUED] allopurinol (ZYLOPRIM) 100 MG tablet Take 100 mg by mouth daily.       . [DISCONTINUED] amitriptyline (ELAVIL) 50 MG tablet Take 50 mg by mouth at bedtime.       . [DISCONTINUED] lisinopril (PRINIVIL,ZESTRIL) 10 MG tablet Take 10 mg by mouth daily.       . [DISCONTINUED] metoprolol succinate (TOPROL-XL) 50 MG 24 hr tablet Take 50 mg by mouth daily.       . [DISCONTINUED] tamsulosin (FLOMAX) 0.4 MG CAPS Take 1 capsule (0.4 mg total) by mouth daily.  7 capsule  0  . [DISCONTINUED] Tamsulosin HCl (FLOMAX) 0.4 MG CAPS Take 1 capsule (0.4 mg total) by mouth daily.  7 capsule  0  . [DISCONTINUED] ZETIA 10 MG tablet Take 10 mg by mouth daily.        No facility-administered encounter medications on file as of 08/20/2012.    No Known Allergies  ROS Review of Systems  Constitutional: Negative for fever and  fatigue.  HENT: Negative for congestion and sore throat.   Eyes: Negative for visual disturbance.  Respiratory: Negative for cough.   Cardiovascular: Negative for chest pain.  Gastrointestinal: Negative for nausea and abdominal pain.  Genitourinary: Negative for dysuria.  Musculoskeletal: Negative for back pain and joint swelling.  Skin: Negative for rash.  Neurological: Negative for weakness and headaches.  Hematological: Negative for adenopathy.    PE; Blood pressure 130/64, pulse 78, temperature 98.6 F (37 C), temperature source Oral, height 5' 10.5" (1.791 m), weight 179 lb 8 oz (81.421 kg), SpO2 98.00%. Gen: Alert, well appearing.  Patient is oriented to person, place, time, and situation. ENT: Ears: EACs clear, normal epithelium.  TMs with good light reflex and landmarks bilaterally.  Eyes: no injection, icteris, swelling, or exudate.  EOMI, PERRLA. Nose: no drainage or turbinate edema/swelling.  No injection or focal lesion.  Mouth: lips without lesion/swelling.  Oral mucosa pink and moist.  Upper dentures in place.  No lower teeth or dentures. Oropharynx without erythema, exudate, or swelling.  Neck: supple/nontender.  No LAD, mass, or TM.  Carotid pulses 2+ bilaterally, without bruits. CV: RRR, 3/6 holosystolic murmur heard over apex and base, ? Best heard at apex.  No diastolic murmur.  No rub or gallop. LUNGS: CTA bilat, nonlabored resps. ABD: soft, NT, ND, BS normal.  No hepatospenomegaly or mass.  No bruits. EXT: no clubbing, cyanosis, or edema.  Femoral pulses 2+, no bruits.  Pertinent labs:  None today  ASSESSMENT AND PLAN:   NEW PT: obtain old records.  Chronic renal insufficiency Unclear hx, need old records. Check CMET with upcoming fasting labs.  Heart murmur, systolic Asymptomatic. Pt reports that Dr. Mare Ferrari did multiple echocardiograms in his office. Will get old records before doing any further w/u at this time.  Hyperlipidemia Will obtain FLP at pt's  earliest convenience.  HTN (hypertension), benign Problem stable.  Continue current medications and diet appropriate for this condition.  We have reviewed our general long term plan for this problem and also reviewed symptoms and signs that should prompt the patient to call or return to the office. Lytes/cr with upcoming fasting labs.   An After Visit Summary was printed and given to the patient.  Return for Lab  visit for FASTING LABS at pt's earliest convenience.

## 2012-08-20 NOTE — Assessment & Plan Note (Signed)
Unclear hx, need old records. Check CMET with upcoming fasting labs.

## 2012-08-20 NOTE — Assessment & Plan Note (Signed)
Problem stable.  Continue current medications and diet appropriate for this condition.  We have reviewed our general long term plan for this problem and also reviewed symptoms and signs that should prompt the patient to call or return to the office. Lytes/cr with upcoming fasting labs.

## 2012-08-20 NOTE — Assessment & Plan Note (Signed)
Asymptomatic. Pt reports that Dr. Mare Ferrari did multiple echocardiograms in his office. Will get old records before doing any further w/u at this time.

## 2012-08-21 ENCOUNTER — Other Ambulatory Visit (INDEPENDENT_AMBULATORY_CARE_PROVIDER_SITE_OTHER): Payer: Medicare Other

## 2012-08-21 DIAGNOSIS — E785 Hyperlipidemia, unspecified: Secondary | ICD-10-CM

## 2012-08-21 DIAGNOSIS — I10 Essential (primary) hypertension: Secondary | ICD-10-CM

## 2012-08-21 DIAGNOSIS — M109 Gout, unspecified: Secondary | ICD-10-CM

## 2012-08-21 NOTE — Progress Notes (Signed)
Labs only

## 2012-08-22 LAB — COMPREHENSIVE METABOLIC PANEL
ALT: 16 U/L (ref 0–53)
AST: 21 U/L (ref 0–37)
Albumin: 3.5 g/dL (ref 3.5–5.2)
Alkaline Phosphatase: 76 U/L (ref 39–117)
BUN: 23 mg/dL (ref 6–23)
CO2: 27 mEq/L (ref 19–32)
Calcium: 9.7 mg/dL (ref 8.4–10.5)
Chloride: 105 mEq/L (ref 96–112)
Creatinine, Ser: 1.9 mg/dL — ABNORMAL HIGH (ref 0.4–1.5)
GFR: 37.6 mL/min — ABNORMAL LOW (ref >60.00)
Glucose, Bld: 99 mg/dL (ref 70–99)
Potassium: 5.1 mEq/L (ref 3.5–5.1)
Sodium: 136 mEq/L (ref 135–145)
Total Bilirubin: 1.1 mg/dL (ref 0.3–1.2)
Total Protein: 6.2 g/dL (ref 6.0–8.3)

## 2012-08-22 LAB — LIPID PANEL
Cholesterol: 135 mg/dL (ref 0–200)
HDL: 28.4 mg/dL — ABNORMAL LOW (ref >39.00)
LDL Cholesterol: 83 mg/dL (ref 0–99)
Total CHOL/HDL Ratio: 5
Triglycerides: 118 mg/dL (ref 0.0–149.0)
VLDL: 23.6 mg/dL (ref 0.0–40.0)

## 2013-03-27 ENCOUNTER — Encounter: Payer: Self-pay | Admitting: Family Medicine

## 2013-03-27 ENCOUNTER — Ambulatory Visit (INDEPENDENT_AMBULATORY_CARE_PROVIDER_SITE_OTHER): Payer: Medicare Other | Admitting: Family Medicine

## 2013-03-27 VITALS — BP 184/83 | HR 88 | Temp 98.2°F | Resp 18 | Ht 70.5 in | Wt 178.0 lb

## 2013-03-27 DIAGNOSIS — I10 Essential (primary) hypertension: Secondary | ICD-10-CM

## 2013-03-27 DIAGNOSIS — E785 Hyperlipidemia, unspecified: Secondary | ICD-10-CM

## 2013-03-27 DIAGNOSIS — N183 Chronic kidney disease, stage 3 unspecified: Secondary | ICD-10-CM

## 2013-03-27 LAB — MICROALBUMIN / CREATININE URINE RATIO
Creatinine,U: 40.5 mg/dL
Microalb Creat Ratio: 72.6 mg/g — ABNORMAL HIGH (ref 0.0–30.0)
Microalb, Ur: 29.4 mg/dL — ABNORMAL HIGH (ref 0.0–1.9)

## 2013-03-27 LAB — URINALYSIS, ROUTINE W REFLEX MICROSCOPIC
Bilirubin Urine: NEGATIVE
Ketones, ur: NEGATIVE
Leukocytes, UA: NEGATIVE
Nitrite: NEGATIVE
Specific Gravity, Urine: 1.01 (ref 1.000–1.030)
Total Protein, Urine: 30
Urine Glucose: NEGATIVE
Urobilinogen, UA: 0.2 (ref 0.0–1.0)
WBC, UA: NONE SEEN (ref 0–?)
pH: 6 (ref 5.0–8.0)

## 2013-03-27 LAB — POCT URINALYSIS DIPSTICK
Bilirubin, UA: NEGATIVE
Glucose, UA: NEGATIVE
Ketones, UA: NEGATIVE
Leukocytes, UA: NEGATIVE
Nitrite, UA: NEGATIVE
Protein, UA: 100
Spec Grav, UA: 1.015
Urobilinogen, UA: 0.2
pH, UA: 6

## 2013-03-27 LAB — BASIC METABOLIC PANEL
BUN: 29 mg/dL — ABNORMAL HIGH (ref 6–23)
CO2: 26 mEq/L (ref 19–32)
Calcium: 9.8 mg/dL (ref 8.4–10.5)
Chloride: 106 mEq/L (ref 96–112)
Creatinine, Ser: 2.1 mg/dL — ABNORMAL HIGH (ref 0.4–1.5)
GFR: 32.08 mL/min — ABNORMAL LOW (ref >60.00)
Glucose, Bld: 110 mg/dL — ABNORMAL HIGH (ref 70–99)
Potassium: 5.4 mEq/L — ABNORMAL HIGH (ref 3.5–5.1)
Sodium: 139 mEq/L (ref 135–145)

## 2013-03-27 NOTE — Progress Notes (Addendum)
Pre visit review using our clinic review tool, if applicable. No additional management support is needed unless otherwise documented below in the visit note.  OFFICE NOTE  03/27/2013  CC:  Chief Complaint  Patient presents with  . Follow-up     HPI: Patient is a 79 y.o. Caucasian male who is here for 6 mo f/u HTN, hyperlipidemia, CRI. Wrist cuff bp measurements at home 120s/70s, HR 70s. Denies any SOB, CP, or dizziness when working in yard.  Going rabbit hunting lately and has no limitations. He got flu vaccine and shingles vaccine this year.  He is compliant with all meds, no adverse side effects.  Pertinent PMH:  Past Medical History  Diagnosis Date  . Hyperlipidemia   . Chronic kidney disease     ckd (? nephrotic syndrome in the 1990s)  . Gout   . Tachyarrhythmia   . Hypertension     takes meds daily  . BPH (benign prostatic hyperplasia)   . H/O exercise stress test     about 5 yrs. ago, saw cardiologist, had normal stress test, told NO need  to f/u /w cardiologist   . Depression   . History of anemia   . Phlebitis   . Colon polyps     Colonoscopy x 3 per pt--initial one with polyp but two 5 yr f/u colonoscopies normal per pt.  . Heart murmur, systolic    Past surgical, social, and family history reviewed and no changes noted since last office visit.  MEDS:  Outpatient Prescriptions Prior to Visit  Medication Sig Dispense Refill  . allopurinol (ZYLOPRIM) 100 MG tablet Take 1 tablet (100 mg total) by mouth daily.  90 tablet  3  . amitriptyline (ELAVIL) 50 MG tablet Take 1 tablet (50 mg total) by mouth at bedtime.  90 tablet  3  . aspirin 81 MG tablet Take 81 mg by mouth daily.      Marland Kitchen ezetimibe (ZETIA) 10 MG tablet Take 1 tablet (10 mg total) by mouth daily.  90 tablet  3  . lisinopril (PRINIVIL,ZESTRIL) 10 MG tablet Take 1 tablet (10 mg total) by mouth daily.  90 tablet  3  . metoprolol succinate (TOPROL-XL) 50 MG 24 hr tablet Take 1 tablet (50 mg total) by mouth  daily.  90 tablet  3  . tamsulosin (FLOMAX) 0.4 MG CAPS Take 1 capsule (0.4 mg total) by mouth daily.  90 capsule  3   No facility-administered medications prior to visit.    PE: Blood pressure 184/83, pulse 88, temperature 98.2 F (36.8 C), temperature source Temporal, resp. rate 18, height 5' 10.5" (1.791 m), weight 178 lb (80.74 kg), SpO2 100.00%. Repeat bp check manually by myself: 180/80 Gen: Alert, well appearing.  Patient is oriented to person, place, time, and situation. Neck - No masses or thyromegaly or limitation in range of motion CV: RRR, [id number] harsh holosystolic murmur heard best at cardiac base, no diastolic murmur.  No rub or gallop or ectopy.   Chest is clear, no wheezing or rales. Normal symmetric air entry throughout both lung fields. No chest wall deformities or tenderness. EXT: no clubbing, cyanosis, or edema.   LAB: CC UA today:  IMPRESSION AND PLAN:  1) HTN; home (wrist cuff) measurements normal but systolic stage 2 here.  He talks of white coat syndrome in the past.  He'll bring his cuff in to compare to ours and he'll get a new cuff if current one is not accurate. Check UA for protein and  check BMET today.  2)CRI: check UA and BMET today.  3) Hyperlipidemia: stable.  The current medical regimen is effective;  continue present plan and medications. Lab Results  Component Value Date   CHOL 135 08/21/2012   HDL 28.40* 08/21/2012   LDLCALC 83 08/21/2012   TRIG 118.0 08/21/2012   CHOLHDL 5 123XX123   4) Systolic heart murmur:  Aortic sclerosis/stenosis vs mitral regurg. Asymptomatic.  Will request records from prior PCP again, as pt says echos have been done.  FOLLOW UP: 29mo, but earlier if bp med adjustment is made soon    ADDENDUM: POCT UA today showed moderate blood and 100 mg/dl protein. Will send for repeat UA with reflex microscopy and also will send for albumin/cr ratio.-PM

## 2013-03-27 NOTE — Addendum Note (Signed)
Addended by: Ralph Dowdy on: 03/27/2013 09:57 AM   Modules accepted: Orders

## 2013-04-01 ENCOUNTER — Telehealth: Payer: Self-pay | Admitting: Family Medicine

## 2013-04-01 NOTE — Telephone Encounter (Signed)
Informed patient of lab results and Dr. Idelle Leech recommendations. Patient voiced understanding and will call back with any further questions

## 2013-08-13 ENCOUNTER — Ambulatory Visit (INDEPENDENT_AMBULATORY_CARE_PROVIDER_SITE_OTHER): Payer: Medicare Other | Admitting: Family Medicine

## 2013-08-13 ENCOUNTER — Encounter: Payer: Self-pay | Admitting: Family Medicine

## 2013-08-13 VITALS — BP 154/79 | HR 64 | Temp 97.8°F | Resp 18 | Ht 70.5 in | Wt 180.0 lb

## 2013-08-13 DIAGNOSIS — N183 Chronic kidney disease, stage 3 unspecified: Secondary | ICD-10-CM

## 2013-08-13 DIAGNOSIS — I10 Essential (primary) hypertension: Secondary | ICD-10-CM

## 2013-08-13 DIAGNOSIS — N189 Chronic kidney disease, unspecified: Secondary | ICD-10-CM

## 2013-08-13 DIAGNOSIS — R011 Cardiac murmur, unspecified: Secondary | ICD-10-CM

## 2013-08-13 DIAGNOSIS — R3129 Other microscopic hematuria: Secondary | ICD-10-CM

## 2013-08-13 DIAGNOSIS — E875 Hyperkalemia: Secondary | ICD-10-CM

## 2013-08-13 LAB — URINALYSIS, ROUTINE W REFLEX MICROSCOPIC
Bilirubin Urine: NEGATIVE
Ketones, ur: NEGATIVE
Leukocytes, UA: NEGATIVE
Nitrite: NEGATIVE
Specific Gravity, Urine: 1.015 (ref 1.000–1.030)
Total Protein, Urine: 30 — AB
Urine Glucose: NEGATIVE
Urobilinogen, UA: 0.2 (ref 0.0–1.0)
pH: 6 (ref 5.0–8.0)

## 2013-08-13 LAB — BASIC METABOLIC PANEL
BUN: 27 mg/dL — ABNORMAL HIGH (ref 6–23)
CO2: 25 mEq/L (ref 19–32)
Calcium: 9.8 mg/dL (ref 8.4–10.5)
Chloride: 107 mEq/L (ref 96–112)
Creatinine, Ser: 2.1 mg/dL — ABNORMAL HIGH (ref 0.4–1.5)
GFR: 31.71 mL/min — ABNORMAL LOW (ref >60.00)
Glucose, Bld: 102 mg/dL — ABNORMAL HIGH (ref 70–99)
Potassium: 5 mEq/L (ref 3.5–5.1)
Sodium: 137 mEq/L (ref 135–145)

## 2013-08-13 LAB — CBC WITH DIFFERENTIAL/PLATELET
Basophils Absolute: 0 10*3/uL (ref 0.0–0.1)
Basophils Relative: 1.2 % (ref 0.0–3.0)
Eosinophils Absolute: 0.1 10*3/uL (ref 0.0–0.7)
Eosinophils Relative: 2.9 % (ref 0.0–5.0)
HCT: 49.8 % (ref 39.0–52.0)
Hemoglobin: 16.9 g/dL (ref 13.0–17.0)
Lymphocytes Relative: 54.2 % — ABNORMAL HIGH (ref 12.0–46.0)
Lymphs Abs: 1.1 10*3/uL (ref 0.7–4.0)
MCHC: 33.9 g/dL (ref 30.0–36.0)
MCV: 91.5 fl (ref 78.0–100.0)
Monocytes Absolute: 0.6 10*3/uL (ref 0.1–1.0)
Monocytes Relative: 27.5 % — ABNORMAL HIGH (ref 3.0–12.0)
Neutro Abs: 0.3 10*3/uL — ABNORMAL LOW (ref 1.4–7.7)
Neutrophils Relative %: 14.2 % — ABNORMAL LOW (ref 43.0–77.0)
Platelets: 151 10*3/uL (ref 150.0–400.0)
RBC: 5.44 Mil/uL (ref 4.22–5.81)
RDW: 14.1 % (ref 11.5–14.6)
WBC: 2 10*3/uL — ABNORMAL LOW (ref 4.5–10.5)

## 2013-08-13 NOTE — Progress Notes (Signed)
OFFICE NOTE  08/13/2013  CC:  Chief Complaint  Patient presents with  . Follow-up  . Medication Refill    90 days zetia,metoprolol,allopurinol,amitriptyline,lisinopril,tamsulosin     HPI: Patient is a 80 y.o. Caucasian male who is here for 4 mo f/u CRI, HTN, hyperlipidemia, recent microscopic hematuria as well as mild hyperkalemia (K+ was 5.4).  Low potassium diet was recommended/stressed.  Made sure he was on no potassium supplements.   Unfortunately, it doesn't look like we'll ever receive his past medical records from previous PMD, Dr. Mare Ferrari.  Denies any complaints today.  Says he feels good.  Push-mows his yard and has no chest pain or SOB or dizziness. No hx of gross hematuria, no hx of kidney stones.  Denies dysuria or any problems urinating.  Denies flank/back pain. BPs for the last 1 wk are avg 119/69 with brand new upper arm bp cuff.  HR 60s.  He says it has been years since his last gout flare.   Pertinent PMH:  Past medical, surgical, social, and family history reviewed and no changes are noted since last office visit.  MEDS:  Outpatient Prescriptions Prior to Visit  Medication Sig Dispense Refill  . allopurinol (ZYLOPRIM) 100 MG tablet Take 1 tablet (100 mg total) by mouth daily.  90 tablet  3  . amitriptyline (ELAVIL) 50 MG tablet Take 1 tablet (50 mg total) by mouth at bedtime.  90 tablet  3  . aspirin 81 MG tablet Take 81 mg by mouth daily.      Marland Kitchen ezetimibe (ZETIA) 10 MG tablet Take 1 tablet (10 mg total) by mouth daily.  90 tablet  3  . lisinopril (PRINIVIL,ZESTRIL) 10 MG tablet Take 1 tablet (10 mg total) by mouth daily.  90 tablet  3  . metoprolol succinate (TOPROL-XL) 50 MG 24 hr tablet Take 1 tablet (50 mg total) by mouth daily.  90 tablet  3  . tamsulosin (FLOMAX) 0.4 MG CAPS Take 1 capsule (0.4 mg total) by mouth daily.  90 capsule  3   No facility-administered medications prior to visit.    PE: Blood pressure 154/79, pulse 64, temperature 97.8 F (36.6  C), temperature source Temporal, resp. rate 18, height 5' 10.5" (1.791 m), weight 180 lb (81.647 kg), SpO2 100.00%. Gen: Alert, well appearing.  Patient is oriented to person, place, time, and situation. NECK: slight transmission of his heart murmur is appreciated but no bruit.  Both carotid pulses 2+. Cv: RRR, 2/6 harsh systolic murmur at upper sternal border, L>R, without diastolic murmur. However, at apex the murmur is a "to and fro" murmur (systolic an diastolic components), S1 more audible than s2 at apex. EXT: no clubbing, cyanosis.  Trace bilat pitting edema.   LAB: none today  IMPRESSION AND PLAN:  1) HTN, well controlled via home monitoring.  White coat component. No change in meds.    2) Hyperkalemia, CRI: recheck BMET today.  If K still high I will have to cut his lisinopril dose in half.  3) Microhematuria: asymptomatic.  Recheck today.  If blood still present, will obtain renal u/s and refer to urology. Marland Kitchen 4) Systolic and diastolic heart murmur, asymptomatic.  Will order transthoracic echo. Check CBC today.  An After Visit Summary was printed and given to the patient.  FOLLOW UP: 52mo

## 2013-08-13 NOTE — Progress Notes (Signed)
Pre visit review using our clinic review tool, if applicable. No additional management support is needed unless otherwise documented below in the visit note. 

## 2013-08-14 ENCOUNTER — Encounter: Payer: Self-pay | Admitting: Family Medicine

## 2013-08-14 ENCOUNTER — Telehealth: Payer: Self-pay | Admitting: Family Medicine

## 2013-08-14 NOTE — Telephone Encounter (Signed)
Relevant patient education mailed to patient.  

## 2013-08-15 ENCOUNTER — Ambulatory Visit: Payer: Medicare Other

## 2013-08-15 ENCOUNTER — Other Ambulatory Visit: Payer: Self-pay | Admitting: Family Medicine

## 2013-08-15 DIAGNOSIS — R3129 Other microscopic hematuria: Secondary | ICD-10-CM

## 2013-08-15 DIAGNOSIS — N183 Chronic kidney disease, stage 3 unspecified: Secondary | ICD-10-CM

## 2013-08-15 DIAGNOSIS — D72819 Decreased white blood cell count, unspecified: Secondary | ICD-10-CM

## 2013-08-15 DIAGNOSIS — D709 Neutropenia, unspecified: Secondary | ICD-10-CM

## 2013-08-15 DIAGNOSIS — M109 Gout, unspecified: Secondary | ICD-10-CM

## 2013-08-15 LAB — URIC ACID: Uric Acid, Serum: 6.2 mg/dL (ref 4.0–7.8)

## 2013-08-16 ENCOUNTER — Telehealth: Payer: Self-pay | Admitting: Family Medicine

## 2013-08-16 MED ORDER — LISINOPRIL 10 MG PO TABS: 10.0000 mg | ORAL_TABLET | Freq: Every day | ORAL | Status: DC

## 2013-08-16 MED ORDER — METOPROLOL SUCCINATE ER 50 MG PO TB24: 50.0000 mg | ORAL_TABLET | Freq: Every day | ORAL | Status: DC

## 2013-08-16 MED ORDER — TAMSULOSIN HCL 0.4 MG PO CAPS: 0.4000 mg | ORAL_CAPSULE | Freq: Every day | ORAL | Status: DC

## 2013-08-16 MED ORDER — EZETIMIBE 10 MG PO TABS: 10.0000 mg | ORAL_TABLET | Freq: Every day | ORAL | Status: DC

## 2013-08-16 MED ORDER — AMITRIPTYLINE HCL 50 MG PO TABS: 50.0000 mg | ORAL_TABLET | Freq: Every day | ORAL | Status: DC

## 2013-08-16 NOTE — Telephone Encounter (Signed)
rx snt 90 day supply x 1 rf.

## 2013-08-21 ENCOUNTER — Ambulatory Visit (HOSPITAL_BASED_OUTPATIENT_CLINIC_OR_DEPARTMENT_OTHER)
Admission: RE | Admit: 2013-08-21 | Discharge: 2013-08-21 | Disposition: A | Discharge status: Home / Self Care | Payer: Medicare Other | Source: Ambulatory Visit | Attending: Family Medicine | Admitting: Family Medicine

## 2013-08-21 DIAGNOSIS — N183 Chronic kidney disease, stage 3 unspecified: Secondary | ICD-10-CM

## 2013-08-21 DIAGNOSIS — I10 Essential (primary) hypertension: Secondary | ICD-10-CM

## 2013-08-21 DIAGNOSIS — I359 Nonrheumatic aortic valve disorder, unspecified: Secondary | ICD-10-CM

## 2013-08-21 DIAGNOSIS — I4891 Unspecified atrial fibrillation: Secondary | ICD-10-CM | POA: Insufficient documentation

## 2013-08-21 DIAGNOSIS — R3129 Other microscopic hematuria: Secondary | ICD-10-CM

## 2013-08-21 DIAGNOSIS — N200 Calculus of kidney: Secondary | ICD-10-CM | POA: Insufficient documentation

## 2013-08-21 DIAGNOSIS — R011 Cardiac murmur, unspecified: Secondary | ICD-10-CM

## 2013-08-21 DIAGNOSIS — I519 Heart disease, unspecified: Secondary | ICD-10-CM | POA: Insufficient documentation

## 2013-08-21 DIAGNOSIS — E785 Hyperlipidemia, unspecified: Secondary | ICD-10-CM

## 2013-08-21 DIAGNOSIS — I517 Cardiomegaly: Secondary | ICD-10-CM | POA: Insufficient documentation

## 2013-08-21 NOTE — Progress Notes (Signed)
Echocardiogram 2D Echocardiogram has been performed.  [place] 08/21/2013, 12:08 PM

## 2013-08-26 ENCOUNTER — Other Ambulatory Visit: Payer: Self-pay | Admitting: Family Medicine

## 2013-08-26 ENCOUNTER — Encounter: Payer: Self-pay | Admitting: Family Medicine

## 2013-08-26 DIAGNOSIS — I350 Nonrheumatic aortic (valve) stenosis: Secondary | ICD-10-CM

## 2013-09-05 ENCOUNTER — Encounter: Payer: Self-pay | Admitting: Family Medicine

## 2013-09-13 ENCOUNTER — Other Ambulatory Visit: Payer: Medicare Other

## 2013-09-13 ENCOUNTER — Other Ambulatory Visit (INDEPENDENT_AMBULATORY_CARE_PROVIDER_SITE_OTHER): Payer: Medicare Other

## 2013-09-13 DIAGNOSIS — D709 Neutropenia, unspecified: Secondary | ICD-10-CM

## 2013-09-13 DIAGNOSIS — D72819 Decreased white blood cell count, unspecified: Secondary | ICD-10-CM

## 2013-09-13 LAB — CBC WITH DIFFERENTIAL/PLATELET
Basophils Absolute: 0 10*3/uL (ref 0.0–0.1)
Basophils Relative: 2 % (ref 0.0–3.0)
Eosinophils Absolute: 0.1 10*3/uL (ref 0.0–0.7)
Eosinophils Relative: 4.5 % (ref 0.0–5.0)
HCT: 43.7 % (ref 39.0–52.0)
Hemoglobin: 15.1 g/dL (ref 13.0–17.0)
Lymphocytes Relative: 61 % — ABNORMAL HIGH (ref 12.0–46.0)
Lymphs Abs: 1 10*3/uL (ref 0.7–4.0)
MCHC: 34.7 g/dL (ref 30.0–36.0)
MCV: 89.6 fl (ref 78.0–100.0)
Monocytes Absolute: 0.6 10*3/uL (ref 0.1–1.0)
Monocytes Relative: 16 % — ABNORMAL HIGH (ref 3.0–12.0)
Neutro Abs: 0.4 10*3/uL — ABNORMAL LOW (ref 1.4–7.7)
Neutrophils Relative %: 16 % — ABNORMAL LOW (ref 43.0–77.0)
Platelets: 124 10*3/uL — ABNORMAL LOW (ref 150.0–400.0)
RBC: 4.88 Mil/uL (ref 4.22–5.81)
RDW: 14 % (ref 11.5–15.5)
WBC: 2.1 10*3/uL — ABNORMAL LOW (ref 4.0–10.5)

## 2013-09-16 LAB — PATHOLOGIST SMEAR REVIEW

## 2013-09-17 ENCOUNTER — Ambulatory Visit (INDEPENDENT_AMBULATORY_CARE_PROVIDER_SITE_OTHER): Payer: Medicare Other | Admitting: Cardiovascular Disease

## 2013-09-17 ENCOUNTER — Encounter: Payer: Self-pay | Admitting: Cardiovascular Disease

## 2013-09-17 VITALS — BP 150/76 | HR 83 | Ht 72.0 in | Wt 179.0 lb

## 2013-09-17 DIAGNOSIS — I350 Nonrheumatic aortic (valve) stenosis: Secondary | ICD-10-CM

## 2013-09-17 DIAGNOSIS — I359 Nonrheumatic aortic valve disorder, unspecified: Secondary | ICD-10-CM

## 2013-09-17 DIAGNOSIS — I10 Essential (primary) hypertension: Secondary | ICD-10-CM

## 2013-09-17 DIAGNOSIS — E785 Hyperlipidemia, unspecified: Secondary | ICD-10-CM

## 2013-09-17 NOTE — Patient Instructions (Signed)
Your physician wants you to follow-up in:  6 months. You will receive a reminder letter in the mail two months in advance. If you don't receive a letter, please call our office to schedule the follow-up appointment.   

## 2013-09-17 NOTE — Progress Notes (Signed)
History of Present Illness: 80 yo male with history of HTN, HLD, CKD and aortic stenosis referred today for evaluation of aortic stenosis. Echo 08/21/13 in primary care with moderate to severe AS (mean gradient 37 mm Hg). He tells me that he feels well. No chest pain, SOB, palpitations, near syncope or syncope. He mows the yard with a push mower. He has no LE edema. He smoked for 30 years but stopped in 1984.   Primary Care Physician: [facility]  Last Lipid Profile:Lipid Panel     Component Value Date/Time   CHOL 135 08/21/2012 0859   TRIG 118.0 08/21/2012 0859   HDL 28.40* 08/21/2012 0859   CHOLHDL 5 08/21/2012 0859   VLDL 23.6 08/21/2012 0859   LDLCALC 83 08/21/2012 0859     Past Medical History  Diagnosis Date  . Hyperlipidemia   . Chronic renal insufficiency, stage III (moderate)     CrCl@30  ml/min.  ?nephrotic syndrome in 78s?--no old records.  . Gout   . Tachyarrhythmia   . Hypertension     takes meds daily  . BPH (benign prostatic hyperplasia)   . H/O exercise stress test     about 5 yrs. ago, saw cardiologist, had normal stress test, told NO need  to f/u /w cardiologist   . Depression   . History of anemia   . Colon polyps     Colonoscopy x 3 per pt--initial one with polyp but two 5 yr f/u colonoscopies normal per pt.  . Moderate to severe aortic stenosis   . Chronic leukopenia     Neutropenia, monocytosis  . Microhematuria     Renal remarkable only for small nonobstructing stone in each kidney---plan to have him see urol after cardiology eval.    Past Surgical History  Procedure Laterality Date  . Prostate surgery      TURP 1980s/90s.--HELPED.  Marland Kitchen Eye surgery      cataract - right  . Colonoscopy    . Inguinal hernia repair  12/27/2011    Procedure: HERNIA REPAIR INGUINAL ADULT;  Surgeon: Odis Hollingshead, MD;  Location: [facility];  Service: General;  Laterality: Right;  . Tonsillectomy      Current Outpatient Prescriptions  Medication Sig Dispense Refill  .  allopurinol (ZYLOPRIM) 100 MG tablet Take 1 tablet (100 mg total) by mouth daily.  90 tablet  3  . amitriptyline (ELAVIL) 50 MG tablet Take 1 tablet (50 mg total) by mouth at bedtime.  90 tablet  1  . aspirin 81 MG tablet Take 81 mg by mouth daily.      Marland Kitchen ezetimibe (ZETIA) 10 MG tablet Take 1 tablet (10 mg total) by mouth daily.  90 tablet  1  . lisinopril (PRINIVIL,ZESTRIL) 10 MG tablet Take 1 tablet (10 mg total) by mouth daily.  90 tablet  1  . metoprolol succinate (TOPROL-XL) 50 MG 24 hr tablet Take 1 tablet (50 mg total) by mouth daily.  90 tablet  1  . tamsulosin (FLOMAX) 0.4 MG CAPS capsule Take 1 capsule (0.4 mg total) by mouth daily.  90 capsule  1   No current facility-administered medications for this visit.    No Known Allergies  History   Social History  . Marital Status: Married    Spouse Name: N/A    Number of Children: N/A  . Years of Education: N/A   Occupational History  . Not on file.   Social History Main Topics  . Smoking status: Former Smoker  Quit date: 12/05/1982  . Smokeless tobacco: Not on file  . Alcohol Use: No  . Drug Use: No  . Sexual Activity:    Other Topics Concern  . Not on file   Social History Narrative   Married, has one son.   Orig from [place], [place].   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   [employer] education.   Tob 30 pack-yr hx, quit 1984.   Alcohol-none    Drugso-none   Likes to hunt and fish.  Still mows yard with push mower.    Family History  Problem Relation Age of Onset  . Arthritis Mother   . Hypertension Father     Review of Systems:  As stated in the HPI and otherwise negative.   BP 150/76  Pulse 83  Ht 6' (1.829 m)  Wt 179 lb (81.194 kg)  BMI 24.27 kg/m2  Physical Examination: General: Well developed, well nourished, NAD HEENT: OP clear, mucus membranes moist SKIN: warm, dry. No rashes. Neuro: No focal deficits Musculoskeletal: Muscle strength 5/5 all ext Psychiatric: Mood and affect normal Neck: No JVD,  no carotid bruits, no thyromegaly, no lymphadenopathy. Lungs:Clear bilaterally, no wheezes, rhonci, crackles Cardiovascular: Regular rate and rhythm. Harsh, loud systolic murmur. No gallops or rubs. Abdomen:Soft. Bowel sounds present. Non-tender.  Extremities: No lower extremity edema. Pulses are 2 + in the bilateral DP/PT.  Echo 08/21/13: Left ventricle: The cavity size was normal. Wall thickness was increased in a pattern of mild LVH. Systolic function was vigorous. The estimated ejection fraction was in the range of 65% to 70%. Wall motion was normal; there were no regional wall motion abnormalities. Doppler parameters are consistent with abnormal left ventricular relaxation (grade 1 diastolic dysfunction). - Aortic valve: Trileaflet; severely calcified leaflets. There was moderate to severe stenosis. Mild regurgitation. Mean gradient: 59mm Hg (S). Peak gradient: 56mm Hg (S). Valve area:1.03cm^2(VTI). - Aorta: Mild aortic root dilation. Aortic root dimension: 46mm (ED). - Mitral valve: Moderately calcified annulus. Mildly calcified leaflets . No significant regurgitation. - Left atrium: The atrium was mildly dilated. - Right ventricle: The cavity size was normal. Systolic function was normal. - Right atrium: The atrium was mildly dilated. - Pulmonary arteries: No complete TR doppler jet so unable to estimate PA systolic pressure. - Inferior vena cava: The vessel was normal in size; the respirophasic diameter changes were in the normal range (= 50%); findings are consistent with normal central venous pressure. Impressions:  - Normal LV size and vigorous systolic function, EF [id number]. Mild LV hypertrophy. Heavily calcified aortic valve with moderate to severe stenosis. Aortic valve area and gradient were difficult to obtain given frequent ectopy during study. Normal RV size and systolic function.  EKG: Sinus, rate 83 bpm. PACs  Assessment and Plan:   1. Aortic stenosis: He  has moderate to severe AS by echo May 2015. He is very active. He has no signs of CHF. He has no chest pain, SOB, dizziness. I have spent the majority of the visit reviewing the natural progression of aortic stenosis. At this time, he is asymptomatic and there is no indication for AVR. He would be a candidate for traditional AVR at this time if needed but he would also be considered for TAVR in the future if his surgical risk was felt to be too high.   2. HTN: BP is slightly elevated today. Followed in primary care.   3. HLD: He is on Zetia. Followed in primary care.

## 2013-09-28 ENCOUNTER — Encounter: Payer: Self-pay | Admitting: Family Medicine

## 2013-10-01 ENCOUNTER — Telehealth: Payer: Self-pay | Admitting: Family Medicine

## 2013-10-01 NOTE — Telephone Encounter (Signed)
FYI.  I sent patient unable to contact letter 10/01/13.  When patient contacts [place] we need to schedule him a 30 minute OV to discuss lab results with Dr. Anitra Lauth please.

## 2013-10-09 ENCOUNTER — Ambulatory Visit (INDEPENDENT_AMBULATORY_CARE_PROVIDER_SITE_OTHER): Payer: Medicare Other | Admitting: Family Medicine

## 2013-10-09 ENCOUNTER — Encounter: Payer: Self-pay | Admitting: Family Medicine

## 2013-10-09 VITALS — BP 151/74 | HR 79 | Temp 98.6°F | Resp 18 | Ht 70.5 in | Wt 180.0 lb

## 2013-10-09 DIAGNOSIS — D72819 Decreased white blood cell count, unspecified: Secondary | ICD-10-CM

## 2013-10-09 NOTE — Progress Notes (Signed)
OFFICE NOTE  10/09/2013  CC:  Chief Complaint  Patient presents with  . Results     HPI: Patient is a 80 y.o. Caucasian male who is here for discussion of labs: chronic leukopenia, monocytosis. Hb and platelets have been normal until most recent check in which platelets were just mildly low. No infections.  Pt feels well.  Pertinent PMH:  Past medical, surgical, social, and family history reviewed and no changes are noted since last office visit.  MEDS:  Outpatient Prescriptions Prior to Visit  Medication Sig Dispense Refill  . allopurinol (ZYLOPRIM) 100 MG tablet Take 1 tablet (100 mg total) by mouth daily.  90 tablet  3  . amitriptyline (ELAVIL) 50 MG tablet Take 1 tablet (50 mg total) by mouth at bedtime.  90 tablet  1  . aspirin 81 MG tablet Take 81 mg by mouth daily.      Marland Kitchen ezetimibe (ZETIA) 10 MG tablet Take 1 tablet (10 mg total) by mouth daily.  90 tablet  1  . lisinopril (PRINIVIL,ZESTRIL) 10 MG tablet Take 1 tablet (10 mg total) by mouth daily.  90 tablet  1  . metoprolol succinate (TOPROL-XL) 50 MG 24 hr tablet Take 1 tablet (50 mg total) by mouth daily.  90 tablet  1  . tamsulosin (FLOMAX) 0.4 MG CAPS capsule Take 1 capsule (0.4 mg total) by mouth daily.  90 capsule  1   No facility-administered medications prior to visit.    PE: Blood pressure 151/74, pulse 79, temperature 98.6 F (37 C), temperature source Temporal, resp. rate 18, height 5' 10.5" (1.791 m), weight 180 lb (81.647 kg), SpO2 100.00%. Gen: Alert, well appearing.  Patient is oriented to person, place, time, and situation. No further exam today.  IMPRESSION AND PLAN:  Chronic leukopenia, needs to see hematologist for further evaluation. I'll see him back as per his already scheduled appt with me in late August, at which time we may then proceed with referral to urology for chronic microscopic hematuria.  An After Visit Summary was printed and given to the patient.

## 2013-10-09 NOTE — Progress Notes (Signed)
Pre visit review using our clinic review tool, if applicable. No additional management support is needed unless otherwise documented below in the visit note. 

## 2013-11-18 ENCOUNTER — Telehealth: Payer: Self-pay | Admitting: Hematology & Oncology

## 2013-11-18 NOTE — Telephone Encounter (Signed)
Spoke w NEW PATIENT today to remind them of their appointment with Dr. Ennever. Also, advised them to bring all medication bottles and insurance card information. ° °

## 2013-11-19 ENCOUNTER — Ambulatory Visit (HOSPITAL_BASED_OUTPATIENT_CLINIC_OR_DEPARTMENT_OTHER): Payer: Medicare Other | Admitting: Hematology & Oncology

## 2013-11-19 ENCOUNTER — Other Ambulatory Visit (HOSPITAL_BASED_OUTPATIENT_CLINIC_OR_DEPARTMENT_OTHER): Payer: Medicare Other | Admitting: Lab

## 2013-11-19 ENCOUNTER — Ambulatory Visit: Payer: Medicare Other

## 2013-11-19 VITALS — BP 150/64 | HR 91 | Temp 99.3°F | Ht 71.0 in | Wt 179.0 lb

## 2013-11-19 DIAGNOSIS — D61818 Other pancytopenia: Secondary | ICD-10-CM

## 2013-11-19 DIAGNOSIS — D72819 Decreased white blood cell count, unspecified: Secondary | ICD-10-CM

## 2013-11-19 DIAGNOSIS — D696 Thrombocytopenia, unspecified: Secondary | ICD-10-CM

## 2013-11-19 LAB — CBC WITH DIFFERENTIAL (CANCER CENTER ONLY)
BASO#: 0 10*3/uL (ref 0.0–0.2)
BASO%: 0.8 % (ref 0.0–2.0)
EOS%: 3.6 % (ref 0.0–7.0)
Eosinophils Absolute: 0.1 10*3/uL (ref 0.0–0.5)
HCT: 46.9 % (ref 38.7–49.9)
HGB: 16.8 g/dL (ref 13.0–17.1)
LYMPH#: 1 10*3/uL (ref 0.9–3.3)
LYMPH%: 40.3 % (ref 14.0–48.0)
MCH: 31.3 pg (ref 28.0–33.4)
MCHC: 35.8 g/dL (ref 32.0–35.9)
MCV: 87 fL (ref 82–98)
MONO#: 0.6 10*3/uL (ref 0.1–0.9)
MONO%: 22.5 % — ABNORMAL HIGH (ref 0.0–13.0)
NEUT#: 0.8 10*3/uL — ABNORMAL LOW (ref 1.5–6.5)
NEUT%: 32.8 % — ABNORMAL LOW (ref 40.0–80.0)
Platelets: 131 10*3/uL — ABNORMAL LOW (ref 145–400)
RBC: 5.37 10*6/uL (ref 4.20–5.70)
RDW: 13.7 % (ref 11.1–15.7)
WBC: 2.5 10*3/uL — ABNORMAL LOW (ref 4.0–10.0)

## 2013-11-19 LAB — CHCC SATELLITE - SMEAR

## 2013-11-19 LAB — TECHNOLOGIST REVIEW CHCC SATELLITE

## 2013-11-19 NOTE — Progress Notes (Signed)
Referral MD  Reason for Referral: Leukopenia   Chief Complaint  Patient presents with  . New Patient  : I am not sure why I'm here  HPI: Richard Sullivan is an 80 year old gentleman. He has been in decent health. He has multiple medical issues. He is on several medications.  He's been noted to have some leukopenia. Going back to a year ago, his white cell count was 2.9. He had a normal hemoglobin and platelet count. MCV was normal. He did have a decreased neutrophil count. Lymphocytes were elevated and monocytes were slightly elevated.  In May of this are down his white cell count down to 2.0. Again he had a decrease in neutrophils and then increase in lymphocytes and monocytes.  In late May, his white cell count was 2.1. Hemoglobin 15. Platelet count was 124,000. MCV was 90. White cell differential showed 16 segs, 61 lymphs, and 16 monos.  He was referred to the [facility] for an evaluation.  He feels well. He's had no complaints. His appetite is good. He's had no fatigue. He's had no fever. He's had no rashes. He's had no cough. He's had no change in bowel or bladder habits. His last colonoscopy was 4 years ago.  He's had no leg swelling. He's had no headache. He's had no swollen glands.  He has no obvious occupational exposures. She stopped smoking probably 30 years ago. He used to drink heavily when he was younger. He does not drink anymore.  There is no family history of blood problems. There is no family history of malignancy.  Overall, his performance status is ECOG 1.         Past Medical History  Diagnosis Date  . Hyperlipidemia   . Chronic renal insufficiency, stage III (moderate)     CrCl@30  ml/min.  ?nephrotic syndrome in 70s?--no old records.  . Gout   . Tachyarrhythmia   . Hypertension     takes meds daily  . BPH (benign prostatic hyperplasia)   . H/O exercise stress test     about 5 yrs. ago, saw cardiologist, had normal stress test, told NO need  to  f/u /w cardiologist   . Depression   . History of anemia   . Colon polyps     Colonoscopy x 3 per pt--initial one with polyp but two 5 yr f/u colonoscopies normal per pt.  . Moderate to severe aortic stenosis   . Chronic leukopenia     Neutropenia, monocytosis  . Microhematuria     Renal u/s remarkable only for small nonobstructing stone in each kidney--possibly see urol after hem/onc eval  :  Past Surgical History  Procedure Laterality Date  . Prostate surgery      TURP 1980s/90s.--HELPED.  Marland Kitchen Eye surgery      cataract - right  . Colonoscopy    . Inguinal hernia repair  12/27/2011    Procedure: HERNIA REPAIR INGUINAL ADULT;  Surgeon: Odis Hollingshead, MD;  Location: [facility];  Service: General;  Laterality: Right;  . Tonsillectomy    :  Current outpatient prescriptions:amitriptyline (ELAVIL) 50 MG tablet, Take 1 tablet (50 mg total) by mouth at bedtime., Disp: 90 tablet, Rfl: 1;  aspirin 81 MG tablet, Take 81 mg by mouth daily., Disp: , Rfl: ;  ezetimibe (ZETIA) 10 MG tablet, Take 1 tablet (10 mg total) by mouth daily., Disp: 90 tablet, Rfl: 1;  lisinopril (PRINIVIL,ZESTRIL) 10 MG tablet, Take 1 tablet (10 mg total) by mouth daily., Disp: 90 tablet,  Rfl: 1 tamsulosin (FLOMAX) 0.4 MG CAPS capsule, Take 1 capsule (0.4 mg total) by mouth daily., Disp: 90 capsule, Rfl: 1;  allopurinol (ZYLOPRIM) 100 MG tablet, Take 1 tablet (100 mg total) by mouth daily., Disp: 90 tablet, Rfl: 3;  metoprolol succinate (TOPROL-XL) 50 MG 24 hr tablet, Take 1 tablet (50 mg total) by mouth daily., Disp: 90 tablet, Rfl: 1:  :  No Known Allergies:  Family History  Problem Relation Age of Onset  . Arthritis Mother   . Hypertension Father   :  History   Social History  . Marital Status: Married    Spouse Name: N/A    Number of Children: 1  . Years of Education: N/A   Occupational History  . Retired    Social History Main Topics  . Smoking status: Former Smoker    Quit date: 12/05/1982  . Smokeless  tobacco: Not on file  . Alcohol Use: No  . Drug Use: No  . Sexual Activity:    Other Topics Concern  . Not on file   Social History Narrative   Married, has one son.   Orig from [place], [place].   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   [employer] education.   Tob 30 pack-yr hx, quit 1984.   Alcohol-none    Drugso-none   Likes to hunt and fish.  Still mows yard with push mower.  :  Pertinent items are noted in HPI.  Exam: @[facility] @  elderly white gentleman in no obvious distress. He is alert in no and oriented x3. His vital signs show temperature of 99.3. Pulse 91. Blood pressure 150/64. Weight is 179 pounds. Head and exam shows no ocular or oral lesions. He has no adenopathy in the neck. Lungs are clear. Cardiac exam regular rhythm with no murmurs rubs or bruits. Abdomen is soft. She has good bowel sounds. There is no fluid wave. There is no palpable liver or spleen tip. Back exam no tenderness over the spine ribs or hips. Extremities shows no clubbing cyanosis or edema. Has good range of motion of his joints. No joint swelling. Has good strength. Skin exam no rashes, ecchymosis or petechia. Neurological exam is nonfocal.   Recent Labs  11/19/13 1055  WBC 2.5*  HGB 16.8  HCT 46.9  PLT 131*   No results found for this basename: NA, K, CL, CO2, GLUCOSE, BUN, CREATININE, CALCIUM,  in the last 72 hours  Blood smear review: Normochromic and normocytic population of red blood cells. He has no rouleaux formation. He has no teardrop cells. There is no target cells. I see no schistocytes or spherocytes. White cells are slightly decreased in number. He does have mature polys. He has  No hypersegmented polys. There are no immature myeloid cells. There is increase in lymphocytes and monocytes. The lymphocytes appear typical. Platelets are slightly decreased in number. Platelets are  well granulated.    Pathology: As above     Assessment and Plan: Richard Sullivan is an 80 year old gentleman with  leukopenia. His physical exam was pretty much unremarkable. He is on a few medications.  He is asymptomatic with this.  I really don't think that we have to do a bone marrow test on him. Because he is asymptomatic, I think we just follow along for right now.  I does wonder about his past alcohol use. I wonder if there may not be some degree of cirrhosis or functional spleno megaly. However, I don't think that we have to do any studies  for right now to look into these. Again, he has a symptomatic.  I want to get him back in 3 months. We will repeat his blood work at that point in time.  Respiratory good 45 minutes with him today. I went over his lab work. However my recommendations. I answered all his questions.

## 2013-11-20 ENCOUNTER — Telehealth: Payer: Self-pay | Admitting: Hematology & Oncology

## 2013-11-20 LAB — IRON AND TIBC CHCC
%SAT: 54 % (ref 20–55)
Iron: 143 ug/dL (ref 42–163)
TIBC: 268 ug/dL (ref 202–409)
UIBC: 124 ug/dL (ref 117–376)

## 2013-11-20 LAB — FERRITIN CHCC: Ferritin: 193 ng/ml (ref 22–316)

## 2013-11-20 NOTE — Telephone Encounter (Signed)
MAILED [phone] SCHEDULE

## 2013-11-21 LAB — LACTATE DEHYDROGENASE: LDH: 150 U/L (ref 94–250)

## 2013-11-21 LAB — RETICULOCYTES (CHCC)
ABS Retic: 65.4 10*3/uL (ref 19.0–186.0)
RBC.: 5.45 MIL/uL (ref 4.22–5.81)
Retic Ct Pct: 1.2 % (ref 0.4–2.3)

## 2013-11-21 LAB — VITAMIN B12: Vitamin B-12: 897 pg/mL (ref 211–911)

## 2013-11-21 LAB — ERYTHROPOIETIN: Erythropoietin: 20.2 m[IU]/mL — ABNORMAL HIGH (ref 2.6–18.5)

## 2013-12-13 ENCOUNTER — Ambulatory Visit (INDEPENDENT_AMBULATORY_CARE_PROVIDER_SITE_OTHER): Payer: Medicare Other | Admitting: Family Medicine

## 2013-12-13 ENCOUNTER — Other Ambulatory Visit: Payer: Self-pay | Admitting: Family Medicine

## 2013-12-13 ENCOUNTER — Encounter: Payer: Self-pay | Admitting: Family Medicine

## 2013-12-13 VITALS — BP 146/73 | HR 68 | Temp 97.8°F | Resp 18 | Ht 70.5 in | Wt 178.0 lb

## 2013-12-13 DIAGNOSIS — N183 Chronic kidney disease, stage 3 unspecified: Secondary | ICD-10-CM

## 2013-12-13 DIAGNOSIS — I10 Essential (primary) hypertension: Secondary | ICD-10-CM

## 2013-12-13 DIAGNOSIS — Z0000 Encounter for general adult medical examination without abnormal findings: Secondary | ICD-10-CM | POA: Insufficient documentation

## 2013-12-13 DIAGNOSIS — R3129 Other microscopic hematuria: Secondary | ICD-10-CM

## 2013-12-13 DIAGNOSIS — E785 Hyperlipidemia, unspecified: Secondary | ICD-10-CM

## 2013-12-13 DIAGNOSIS — Z23 Encounter for immunization: Secondary | ICD-10-CM

## 2013-12-13 DIAGNOSIS — D72819 Decreased white blood cell count, unspecified: Secondary | ICD-10-CM | POA: Insufficient documentation

## 2013-12-13 DIAGNOSIS — E875 Hyperkalemia: Secondary | ICD-10-CM

## 2013-12-13 LAB — COMPREHENSIVE METABOLIC PANEL
ALT: 15 U/L (ref 0–53)
AST: 16 U/L (ref 0–37)
Albumin: 3.5 g/dL (ref 3.5–5.2)
Alkaline Phosphatase: 63 U/L (ref 39–117)
BUN: 28 mg/dL — ABNORMAL HIGH (ref 6–23)
CO2: 27 mEq/L (ref 19–32)
Calcium: 10 mg/dL (ref 8.4–10.5)
Chloride: 106 mEq/L (ref 96–112)
Creatinine, Ser: 2 mg/dL — ABNORMAL HIGH (ref 0.4–1.5)
GFR: 33.67 mL/min — ABNORMAL LOW (ref >60.00)
Glucose, Bld: 92 mg/dL (ref 70–99)
Potassium: 5.8 mEq/L — ABNORMAL HIGH (ref 3.5–5.1)
Sodium: 138 mEq/L (ref 135–145)
Total Bilirubin: 1.1 mg/dL (ref 0.2–1.2)
Total Protein: 6.8 g/dL (ref 6.0–8.3)

## 2013-12-13 LAB — URINALYSIS, ROUTINE W REFLEX MICROSCOPIC
Bilirubin Urine: NEGATIVE
Ketones, ur: NEGATIVE
Leukocytes, UA: NEGATIVE
Nitrite: NEGATIVE
Specific Gravity, Urine: <=1.005 — AB (ref 1.000–1.030)
Total Protein, Urine: 30 — AB
Urine Glucose: NEGATIVE
Urobilinogen, UA: 0.2 (ref 0.0–1.0)
pH: 5.5 (ref 5.0–8.0)

## 2013-12-13 MED ORDER — LISINOPRIL 10 MG PO TABS: ORAL_TABLET | ORAL | Status: DC

## 2013-12-13 NOTE — Progress Notes (Signed)
OFFICE VISIT  12/13/2013   CC:  Chief Complaint  Patient presents with  . Follow-up    not fasting     HPI:    Patient is a 80 y.o. Caucasian male who presents for 4 mo f/u HTN, hyperlipidemia, microscopic hematuria, chronic leukopenia. I referred him to hem/onc 09/2013.  Dr. Marin Olp chose to do no further/special testing and is going to follow his blood work --next f/u to be 02/2014.  Home bp checks normal consistently (120s/60s) He feels well.  Denies hematuria, dysuria, flank/side pain.   No probs with joints since being off the allopurinol.  Past Medical History  Diagnosis Date  . Hyperlipidemia   . Chronic renal insufficiency, stage III (moderate)     CrCl@30  ml/min.  ?nephrotic syndrome in 51s?--no old records.  . Gout   . Tachyarrhythmia   . Hypertension     takes meds daily  . BPH (benign prostatic hyperplasia)   . H/O exercise stress test     about 5 yrs. ago, saw cardiologist, had normal stress test, told NO need  to f/u /w cardiologist   . Depression   . History of anemia   . Colon polyps     Colonoscopy x 3 per pt--initial one with polyp but two 5 yr f/u colonoscopies normal per pt.  . Moderate to severe aortic stenosis   . Chronic leukopenia     Neutropenia, monocytosis  . Microhematuria     Renal u/s remarkable only for small nonobstructing stone in each kidney--possibly see urol after hem/onc eval    Past Surgical History  Procedure Laterality Date  . Prostate surgery      TURP 1980s/90s.--HELPED.  Marland Kitchen Eye surgery      cataract - right  . Colonoscopy    . Inguinal hernia repair  12/27/2011    Procedure: HERNIA REPAIR INGUINAL ADULT;  Surgeon: Odis Hollingshead, MD;  Location: [facility];  Service: General;  Laterality: Right;  . Tonsillectomy      Outpatient Prescriptions Prior to Visit  Medication Sig Dispense Refill  . amitriptyline (ELAVIL) 50 MG tablet Take 1 tablet (50 mg total) by mouth at bedtime.  90 tablet  1  . aspirin 81 MG tablet Take 81  mg by mouth daily.      Marland Kitchen ezetimibe (ZETIA) 10 MG tablet Take 1 tablet (10 mg total) by mouth daily.  90 tablet  1  . lisinopril (PRINIVIL,ZESTRIL) 10 MG tablet Take 1 tablet (10 mg total) by mouth daily.  90 tablet  1  . metoprolol succinate (TOPROL-XL) 50 MG 24 hr tablet Take 1 tablet (50 mg total) by mouth daily.  90 tablet  1  . tamsulosin (FLOMAX) 0.4 MG CAPS capsule Take 1 capsule (0.4 mg total) by mouth daily.  90 capsule  1  . allopurinol (ZYLOPRIM) 100 MG tablet Take 1 tablet (100 mg total) by mouth daily.  90 tablet  3   No facility-administered medications prior to visit.    No Known Allergies  ROS As per HPI  PE: Blood pressure 146/73, pulse 68, temperature 97.8 F (36.6 C), temperature source Temporal, resp. rate 18, height 5' 10.5" (1.791 m), weight 178 lb (80.74 kg), SpO2 99.00%. Gen: Alert, well appearing.  Patient is oriented to person, place, time, and situation. No further exam today.  LABS:  None today  IMPRESSION AND PLAN:  1) HTN; The current medical regimen is effective;  continue present plan and medications.  2) CRI, stage 3: Lytes/cr today.  3) Hyperlipidemia: tolerating zetia. AST/ALT today.  4) Chronic mild leukopenia: Dr. Marin Olp following, next appt 02/2014.  5) Microscopic hematuria: UA with micro today was sent to lab.  His renal u/s showed very small renal stone but nonobstructing. Refer to Urology for consideration of cystoscopy.  6) Prev health care: Flu vaccine and prevnar 13 IM today.  An After Visit Summary was printed and given to the patient.  FOLLOW UP:  6 mo

## 2013-12-13 NOTE — Progress Notes (Signed)
Pre visit review using our clinic review tool, if applicable. No additional management support is needed unless otherwise documented below in the visit note. 

## 2013-12-20 ENCOUNTER — Other Ambulatory Visit (INDEPENDENT_AMBULATORY_CARE_PROVIDER_SITE_OTHER): Payer: Medicare Other

## 2013-12-20 DIAGNOSIS — N183 Chronic kidney disease, stage 3 unspecified: Secondary | ICD-10-CM

## 2013-12-20 DIAGNOSIS — E875 Hyperkalemia: Secondary | ICD-10-CM

## 2013-12-20 LAB — BASIC METABOLIC PANEL
BUN: 25 mg/dL — ABNORMAL HIGH (ref 6–23)
CO2: 23 mEq/L (ref 19–32)
Calcium: 9.4 mg/dL (ref 8.4–10.5)
Chloride: 108 mEq/L (ref 96–112)
Creatinine, Ser: 2.4 mg/dL — ABNORMAL HIGH (ref 0.4–1.5)
GFR: 27.62 mL/min — ABNORMAL LOW (ref >60.00)
Glucose, Bld: 130 mg/dL — ABNORMAL HIGH (ref 70–99)
Potassium: 4.6 mEq/L (ref 3.5–5.1)
Sodium: 137 mEq/L (ref 135–145)

## 2013-12-24 ENCOUNTER — Other Ambulatory Visit: Payer: Self-pay | Admitting: Family Medicine

## 2013-12-24 DIAGNOSIS — N183 Chronic kidney disease, stage 3 unspecified: Secondary | ICD-10-CM

## 2014-01-17 ENCOUNTER — Encounter: Payer: Self-pay | Admitting: Family Medicine

## 2014-01-17 ENCOUNTER — Other Ambulatory Visit (INDEPENDENT_AMBULATORY_CARE_PROVIDER_SITE_OTHER): Payer: Medicare Other

## 2014-01-17 DIAGNOSIS — N183 Chronic kidney disease, stage 3 unspecified: Secondary | ICD-10-CM

## 2014-01-17 LAB — BASIC METABOLIC PANEL
BUN: 31 mg/dL — ABNORMAL HIGH (ref 6–23)
CO2: 23 mEq/L (ref 19–32)
Calcium: 9.8 mg/dL (ref 8.4–10.5)
Chloride: 107 mEq/L (ref 96–112)
Creatinine, Ser: 2.3 mg/dL — ABNORMAL HIGH (ref 0.4–1.5)
GFR: 29.44 mL/min — ABNORMAL LOW (ref >60.00)
Glucose, Bld: 97 mg/dL (ref 70–99)
Potassium: 5 mEq/L (ref 3.5–5.1)
Sodium: 137 mEq/L (ref 135–145)

## 2014-02-11 ENCOUNTER — Other Ambulatory Visit: Payer: Self-pay | Admitting: Family Medicine

## 2014-02-11 MED ORDER — EZETIMIBE 10 MG PO TABS: 10.0000 mg | ORAL_TABLET | Freq: Every day | ORAL | Status: DC

## 2014-02-11 MED ORDER — TAMSULOSIN HCL 0.4 MG PO CAPS: 0.4000 mg | ORAL_CAPSULE | Freq: Every day | ORAL | Status: DC

## 2014-02-11 MED ORDER — METOPROLOL SUCCINATE ER 50 MG PO TB24: 50.0000 mg | ORAL_TABLET | Freq: Every day | ORAL | Status: DC

## 2014-02-11 MED ORDER — AMITRIPTYLINE HCL 50 MG PO TABS: 50.0000 mg | ORAL_TABLET | Freq: Every day | ORAL | Status: DC

## 2014-02-11 MED ORDER — LISINOPRIL 10 MG PO TABS: ORAL_TABLET | ORAL | Status: DC

## 2014-02-19 ENCOUNTER — Ambulatory Visit: Payer: Medicare Other | Admitting: Hematology & Oncology

## 2014-02-19 ENCOUNTER — Other Ambulatory Visit: Payer: Medicare Other | Admitting: Lab

## 2014-03-17 ENCOUNTER — Ambulatory Visit: Payer: Medicare Other | Admitting: Cardiovascular Disease

## 2014-06-17 DIAGNOSIS — E039 Hypothyroidism, unspecified: Secondary | ICD-10-CM | POA: Insufficient documentation

## 2014-06-17 DIAGNOSIS — E038 Other specified hypothyroidism: Secondary | ICD-10-CM | POA: Insufficient documentation

## 2014-06-17 HISTORY — DX: Other specified hypothyroidism: E03.8

## 2014-06-20 ENCOUNTER — Ambulatory Visit (INDEPENDENT_AMBULATORY_CARE_PROVIDER_SITE_OTHER): Payer: Medicare Other | Admitting: Nurse Practitioner

## 2014-06-20 ENCOUNTER — Encounter: Payer: Self-pay | Admitting: Family Medicine

## 2014-06-20 VITALS — BP 140/70 | HR 97 | Temp 97.2°F | Ht 70.5 in | Wt 185.0 lb

## 2014-06-20 DIAGNOSIS — E785 Hyperlipidemia, unspecified: Secondary | ICD-10-CM

## 2014-06-20 DIAGNOSIS — M1000 Idiopathic gout, unspecified site: Secondary | ICD-10-CM

## 2014-06-20 DIAGNOSIS — N184 Chronic kidney disease, stage 4 (severe): Secondary | ICD-10-CM

## 2014-06-20 DIAGNOSIS — R319 Hematuria, unspecified: Secondary | ICD-10-CM

## 2014-06-20 DIAGNOSIS — R0989 Other specified symptoms and signs involving the circulatory and respiratory systems: Secondary | ICD-10-CM

## 2014-06-20 DIAGNOSIS — D72819 Decreased white blood cell count, unspecified: Secondary | ICD-10-CM

## 2014-06-20 DIAGNOSIS — I10 Essential (primary) hypertension: Secondary | ICD-10-CM

## 2014-06-20 LAB — CBC WITH DIFFERENTIAL/PLATELET
Basophils Absolute: 0 10*3/uL (ref 0.0–0.1)
Basophils Relative: 1.2 % (ref 0.0–3.0)
Eosinophils Absolute: 0.1 10*3/uL (ref 0.0–0.7)
Eosinophils Relative: 4 % (ref 0.0–5.0)
HCT: 51.6 % (ref 39.0–52.0)
Hemoglobin: 17.5 g/dL — ABNORMAL HIGH (ref 13.0–17.0)
Lymphocytes Relative: 43.4 % (ref 12.0–46.0)
Lymphs Abs: 0.9 10*3/uL (ref 0.7–4.0)
MCHC: 34 g/dL (ref 30.0–36.0)
MCV: 87.6 fl (ref 78.0–100.0)
Monocytes Absolute: 0.6 10*3/uL (ref 0.1–1.0)
Monocytes Relative: 26.4 % — ABNORMAL HIGH (ref 3.0–12.0)
Neutro Abs: 0.5 10*3/uL — ABNORMAL LOW (ref 1.4–7.7)
Neutrophils Relative %: 25 % — ABNORMAL LOW (ref 43.0–77.0)
Platelets: 142 10*3/uL — ABNORMAL LOW (ref 150.0–400.0)
RBC: 5.89 Mil/uL — ABNORMAL HIGH (ref 4.22–5.81)
RDW: 14.6 % (ref 11.5–15.5)
WBC: 2.2 10*3/uL — ABNORMAL LOW (ref 4.0–10.5)

## 2014-06-20 LAB — COMPREHENSIVE METABOLIC PANEL
ALT: 19 U/L (ref 0–53)
AST: 21 U/L (ref 0–37)
Albumin: 3.5 g/dL (ref 3.5–5.2)
Alkaline Phosphatase: 76 U/L (ref 39–117)
BUN: 32 mg/dL — ABNORMAL HIGH (ref 6–23)
CO2: 30 mEq/L (ref 19–32)
Calcium: 9.9 mg/dL (ref 8.4–10.5)
Chloride: 106 mEq/L (ref 96–112)
Creatinine, Ser: 2 mg/dL — ABNORMAL HIGH (ref 0.40–1.50)
GFR: 34.21 mL/min — ABNORMAL LOW (ref >60.00)
Glucose, Bld: 89 mg/dL (ref 70–99)
Potassium: 5.1 mEq/L (ref 3.5–5.1)
Sodium: 139 mEq/L (ref 135–145)
Total Bilirubin: 1.2 mg/dL (ref 0.2–1.2)
Total Protein: 6.5 g/dL (ref 6.0–8.3)

## 2014-06-20 LAB — LIPID PANEL
Cholesterol: 127 mg/dL (ref 0–200)
HDL: 34.8 mg/dL — ABNORMAL LOW (ref >39.00)
LDL Cholesterol: 70 mg/dL (ref 0–99)
NonHDL: 92.2
Total CHOL/HDL Ratio: 4
Triglycerides: 111 mg/dL (ref 0.0–149.0)
VLDL: 22.2 mg/dL (ref 0.0–40.0)

## 2014-06-20 LAB — URIC ACID: Uric Acid, Serum: 7.4 mg/dL (ref 4.0–7.8)

## 2014-06-20 LAB — URINALYSIS, MICROSCOPIC ONLY

## 2014-06-20 LAB — TSH: TSH: 5.17 u[IU]/mL — ABNORMAL HIGH (ref 0.35–4.50)

## 2014-06-20 NOTE — Patient Instructions (Signed)
Please see cardiology.  Please see Dr Marin Olp again to repeat labs.  Continue with current medications.  See DR Anitra Lauth in 2  Months.  Nice to meet you!

## 2014-06-20 NOTE — Progress Notes (Signed)
Pre visit review using our clinic review tool, if applicable. No additional management support is needed unless otherwise documented below in the visit note. 

## 2014-06-23 ENCOUNTER — Other Ambulatory Visit: Payer: Self-pay | Admitting: *Deleted

## 2014-06-23 ENCOUNTER — Telehealth: Payer: Self-pay | Admitting: Hematology & Oncology

## 2014-06-23 ENCOUNTER — Other Ambulatory Visit (INDEPENDENT_AMBULATORY_CARE_PROVIDER_SITE_OTHER): Payer: Medicare Other

## 2014-06-23 ENCOUNTER — Encounter: Payer: Self-pay | Admitting: Family Medicine

## 2014-06-23 DIAGNOSIS — R946 Abnormal results of thyroid function studies: Secondary | ICD-10-CM

## 2014-06-23 DIAGNOSIS — D72819 Decreased white blood cell count, unspecified: Secondary | ICD-10-CM

## 2014-06-23 LAB — T4, FREE: Free T4: 1.13 ng/dL (ref 0.60–1.60)

## 2014-06-23 LAB — T3, FREE: T3, Free: 3.7 pg/mL (ref 2.3–4.2)

## 2014-06-23 NOTE — Telephone Encounter (Signed)
Pt called scheduled 3-8 appointment

## 2014-06-24 ENCOUNTER — Encounter: Payer: Self-pay | Admitting: Hematology & Oncology

## 2014-06-24 ENCOUNTER — Ambulatory Visit (HOSPITAL_BASED_OUTPATIENT_CLINIC_OR_DEPARTMENT_OTHER): Payer: Medicare Other | Admitting: Hematology & Oncology

## 2014-06-24 ENCOUNTER — Other Ambulatory Visit: Payer: Self-pay | Admitting: Family Medicine

## 2014-06-24 ENCOUNTER — Other Ambulatory Visit (HOSPITAL_BASED_OUTPATIENT_CLINIC_OR_DEPARTMENT_OTHER): Payer: Medicare Other | Admitting: Lab

## 2014-06-24 VITALS — BP 181/79 | HR 79 | Temp 98.0°F | Resp 18 | Ht 70.0 in | Wt 185.0 lb

## 2014-06-24 DIAGNOSIS — D72819 Decreased white blood cell count, unspecified: Secondary | ICD-10-CM

## 2014-06-24 DIAGNOSIS — R0989 Other specified symptoms and signs involving the circulatory and respiratory systems: Secondary | ICD-10-CM | POA: Insufficient documentation

## 2014-06-24 DIAGNOSIS — D696 Thrombocytopenia, unspecified: Secondary | ICD-10-CM

## 2014-06-24 DIAGNOSIS — R7989 Other specified abnormal findings of blood chemistry: Secondary | ICD-10-CM

## 2014-06-24 DIAGNOSIS — N189 Chronic kidney disease, unspecified: Secondary | ICD-10-CM | POA: Insufficient documentation

## 2014-06-24 LAB — CBC WITH DIFFERENTIAL (CANCER CENTER ONLY)
BASO#: 0.1 10*3/uL (ref 0.0–0.2)
BASO%: 2.9 % — ABNORMAL HIGH (ref 0.0–2.0)
EOS%: 4.4 % (ref 0.0–7.0)
Eosinophils Absolute: 0.1 10*3/uL (ref 0.0–0.5)
HCT: 48 % (ref 38.7–49.9)
HGB: 17.1 g/dL (ref 13.0–17.1)
LYMPH#: 0.9 10*3/uL (ref 0.9–3.3)
LYMPH%: 44.4 % (ref 14.0–48.0)
MCH: 30.4 pg (ref 28.0–33.4)
MCHC: 35.6 g/dL (ref 32.0–35.9)
MCV: 85 fL (ref 82–98)
MONO#: 0.5 10*3/uL (ref 0.1–0.9)
MONO%: 24.9 % — ABNORMAL HIGH (ref 0.0–13.0)
NEUT#: 0.5 10*3/uL — CL (ref 1.5–6.5)
NEUT%: 23.4 % — ABNORMAL LOW (ref 40.0–80.0)
Platelets: 127 10*3/uL — ABNORMAL LOW (ref 145–400)
RBC: 5.63 10*6/uL (ref 4.20–5.70)
RDW: 14 % (ref 11.1–15.7)
WBC: 2.1 10*3/uL — ABNORMAL LOW (ref 4.0–10.0)

## 2014-06-24 LAB — FERRITIN CHCC: Ferritin: 124 ng/ml (ref 22–316)

## 2014-06-24 LAB — IRON AND TIBC CHCC
%SAT: 39 % (ref 20–55)
Iron: 100 ug/dL (ref 42–163)
TIBC: 255 ug/dL (ref 202–409)
UIBC: 155 ug/dL (ref 117–376)

## 2014-06-24 LAB — RETICULOCYTES (CHCC)
ABS Retic: 94.9 10*3/uL (ref 19.0–186.0)
RBC.: 5.58 MIL/uL (ref 4.22–5.81)
Retic Ct Pct: 1.7 % (ref 0.4–2.3)

## 2014-06-24 LAB — CHCC SATELLITE - SMEAR

## 2014-06-24 NOTE — Progress Notes (Signed)
Subjective:     Richard Sullivan is a 81 y.o. male who presents for f/u of HTN & hyperlipidemia. He is new to me, but sees Dr Anitra Lauth on regular basis. He has remote Hx of hematuria & renal insufficicency. He denies frank hematuria. He has recent Hx of leukopenia for which he was evaluated by onc. Dr Antonieta Pert note reviewed dated 11/2013: initial w/u was negative, but pt was to f/u in 3 mos for repeat blood work. Pt was lost to f/u. I reminded pt that Dr Marin Olp wanted to see him again-pt is willing to schedule appt. He has no new complaints of fever, frequent infections. Gained 7 lb since last OV. HTN: current meds-toprol 50mg  qd. Tolerating w/out SE. Pt stopped taking lisinopril. Considering he has renal insufficiency, he has fair control: BP goal is 135/80. RF: age, gender, hyperlipidemia.  Lipids: zetia 10 mg qd. Tolerating w/o SE. Nml wt., activity-light housework & yardwork.   The following portions of the patient's history were reviewed and updated as appropriate: allergies, current medications, past medical history, past social history, past surgical history and problem list.  Review of Systems Constitutional: negative for fatigue and night sweats Respiratory: negative for cough Cardiovascular: negative for irregular heart beat and lower extremity edema Gastrointestinal: negative for abdominal pain, constipation and diarrhea Genitourinary:negative for decreased stream, taking flomax Musculoskeletal:negative for arthralgias, Hx of gout    Objective:    BP 140/70 mmHg  Pulse 97  Temp(Src) 97.2 F (36.2 C) (Temporal)  Ht 5' 10.5" (1.791 m)  Wt 185 lb (83.915 kg)  BMI 26.16 kg/m2  SpO2 94% BP 140/70 mmHg  Pulse 97  Temp(Src) 97.2 F (36.2 C) (Temporal)  Ht 5' 10.5" (1.791 m)  Wt 185 lb (83.915 kg)  BMI 26.16 kg/m2  SpO2 94% General appearance: alert, cooperative, appears stated age and no distress Head: Normocephalic, without obvious abnormality, atraumatic Eyes: negative  findings: lids and lashes normal and pupils equal, round, reactive to light and accomodation, positive findings: conjunctiva: mildly icteric Neck: no adenopathy, supple, symmetrical, trachea midline, thyroid not enlarged, symmetric, no tenderness/mass/nodules and POS for L carotic bruit Lungs: clear to auscultation bilaterally Heart: regularly irregular rhythm Extremities: edema +1 pretibial edema Pulses: 2+ and symmetric Lymph nodes: Cervical adenopathy: none and Supraclavicular adenopathy: none Neurologic: Grossly normal    Assessment:Plan   1. Hematuria Renal insifficicency - Urine Microscopic  2. Idiopathic gout, unspecified chronicity, unspecified site Denies arthralgia, not taking allopurinol  - Uric acid  3. Essential hypertension, benign Fair control. GOAL 135/80, renal insuff. Continue toprol Restart ACEI?  -comprehensive metabolic panel  4. Hyperlipidemia Continue zetia - Comprehensive metabolic panel - Lipid panel - TSH  5. Leukopenia F/u w/ onc as recommended by onc - CBC with Differential/Platelet  6. Left carotid bruit Consult cardio for further eval  7. Chronic kidney disease, stage 4 (severe) Last GFR 29 Mild periph edema, no anemia, alb & Ca nml, pos proteinuria Taking statin Consider re-starting ACEI & ref to nephrology  F/u 3 mos-CKD 4, HTN

## 2014-06-24 NOTE — Progress Notes (Signed)
Hematology and Oncology Follow Up Visit  SYED ZUKAS [id number] 09-11-1930 81 y.o. 06/24/2014   Principle Diagnosis:   Leukopenia and thrombocytopenia  Current Therapy:    Observation     Interim History:  Mr. Carcamo is back for follow-up. We first saw him back in August. Since then, he's had no problems. His had no infections. He's had no fever. He's had no rashes. He's had no change in bowel or bladder habits. He's had no cough. He's had no change in medications.  His appetite has been pretty good. He's had no nausea or vomiting.  Overall, his performance status is ECOG 2.  Medications:  Current outpatient prescriptions:  .  amitriptyline (ELAVIL) 50 MG tablet, Take 1 tablet (50 mg total) by mouth at bedtime., Disp: 90 tablet, Rfl: 1 .  aspirin 81 MG tablet, Take 81 mg by mouth daily., Disp: , Rfl:  .  ezetimibe (ZETIA) 10 MG tablet, Take 1 tablet (10 mg total) by mouth daily., Disp: 90 tablet, Rfl: 1 .  metoprolol succinate (TOPROL-XL) 50 MG 24 hr tablet, Take 1 tablet (50 mg total) by mouth daily., Disp: 90 tablet, Rfl: 1 .  tamsulosin (FLOMAX) 0.4 MG CAPS capsule, Take 1 capsule (0.4 mg total) by mouth daily., Disp: 90 capsule, Rfl: 1  Allergies: No Known Allergies  Past Medical History, Surgical history, Social history, and Family History were reviewed and updated.  Review of Systems: As above  Physical Exam:  height is _0  (1.778 m) and weight is 185 lb (83.915 kg). His oral temperature is 98 F (36.7 C). His blood pressure is 181/79 and his pulse is 79. His respiration is 18.   Wt Readings from Last 3 Encounters:  06/24/14 185 lb (83.915 kg)  06/20/14 185 lb (83.915 kg)  12/13/13 178 lb (80.74 kg)     Elderly white showman. Head and neck exam shows no ocular or oral lesions. There are no palpable cervical or supraclavicular lymph nodes. Lungs are clear. Cardiac exam regular rate and rhythm with no murmurs, rubs or bruits. Abdomen is soft. He has  good bowel sounds. There is no fluid wave. There is no palpable liver or spleen tip. Back exam shows no tenderness over the spine, ribs or hips. Skin exam shows no rashes, ecchymoses or petechia. Neurological exam is nonfocal.  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 He has leukopenia and thrombocytopenia.  I looked at his blood smear. I do not see anything that looked unusual. He had no immature myeloid or lymphoid forms. He had no hyper segmented polys.  I have to suspect that he probably has myelodysplasia. I think that the only way for [place] to know this is with a bone marrow biopsy.  I want to try to  get him back in about a month or so. I may have to do a procedure on him at that time. I would like to get an ultrasound of his abdomen to look at his spleen and liver.     Volanda Napoleon, MD 3/8/201610:17 AM

## 2014-06-25 ENCOUNTER — Telehealth: Payer: Self-pay | Admitting: Family Medicine

## 2014-06-25 LAB — T3: T3, Total: 131.2 ng/dL (ref 80.0–204.0)

## 2014-06-25 NOTE — Telephone Encounter (Signed)
Returned pt call and he requested lab results.  Pt notified labs were stable.

## 2014-06-25 NOTE — Telephone Encounter (Signed)
Patient is returning Lattie Haw to call him back

## 2014-07-01 ENCOUNTER — Ambulatory Visit (HOSPITAL_BASED_OUTPATIENT_CLINIC_OR_DEPARTMENT_OTHER)
Admission: RE | Admit: 2014-07-01 | Discharge: 2014-07-01 | Disposition: A | Discharge status: Home / Self Care | Payer: Medicare Other | Source: Ambulatory Visit | Attending: Hematology & Oncology | Admitting: Hematology & Oncology

## 2014-07-01 DIAGNOSIS — R161 Splenomegaly, not elsewhere classified: Secondary | ICD-10-CM | POA: Diagnosis not present

## 2014-07-01 DIAGNOSIS — D696 Thrombocytopenia, unspecified: Secondary | ICD-10-CM | POA: Diagnosis present

## 2014-07-01 DIAGNOSIS — D72819 Decreased white blood cell count, unspecified: Secondary | ICD-10-CM | POA: Insufficient documentation

## 2014-07-15 ENCOUNTER — Telehealth: Payer: Self-pay | Admitting: Nurse Practitioner

## 2014-07-15 NOTE — Telephone Encounter (Addendum)
Patient verbalized understanding and appreciation.   ----- Message from Richard Napoleon, MD sent at 07/14/2014  3:37 PM EDT ----- Call - spleen is a little big!!  Liver looks ok!!  Ultimately, we may need to do a bone marrow test on you if blood counts do not get better.  [name]

## 2014-07-18 ENCOUNTER — Ambulatory Visit (INDEPENDENT_AMBULATORY_CARE_PROVIDER_SITE_OTHER): Payer: Medicare Other | Admitting: Nurse Practitioner

## 2014-07-18 ENCOUNTER — Encounter: Payer: Self-pay | Admitting: Nurse Practitioner

## 2014-07-18 VITALS — BP 142/70 | HR 84 | Resp 16 | Ht 72.0 in | Wt 187.0 lb

## 2014-07-18 DIAGNOSIS — I350 Nonrheumatic aortic (valve) stenosis: Secondary | ICD-10-CM

## 2014-07-18 NOTE — Patient Instructions (Signed)
We will arrange for an echocardiogram  See Dr. Angelena Form in one year  Call us for any chest pain, shortness of breath, or passing out.  Call the [place] office at [phone] if you have any questions, problems or concerns.

## 2014-07-18 NOTE — Progress Notes (Signed)
CARDIOLOGY OFFICE NOTE  Date:  07/18/2014    Richard Sullivan Date of Birth: 09-27-1930 Medical Record [id number]  PCP:  Tammi Sou, MD  Cardiologist:  Angelena Form    Chief Complaint  Patient presents with  . Aortic Stenosis    Follow up visit - seen for Dr. Angelena Form     History of Present Illness: Richard Sullivan is a 81 y.o. male who presents today for a follow up visit. He has a history of HTN, HLD, CKD and aortic stenosis.   Echo 08/21/13 in primary care with moderate to severe AS (mean gradient 37 mm Hg). Other issues include chronic leukopenia and hypothyroidism.  Seen here back in June for consultation - was doing well with no cardinal symptoms.  Comes back today. Here alone. Wife is disabled. He continues to do well. No chest pain, not short of breath, no passing out spells. Energy level is ok. Tolerating his medicines. Still working in his yard. Has no complaint.  Past Medical History  Diagnosis Date  . Hyperlipidemia   . Chronic renal insufficiency, stage III (moderate)     CrCl@30  ml/min.  ?nephrotic syndrome in 39s?--no old records.  . Gout     Doing ok since off allopurinol 2015  . Tachyarrhythmia   . Hypertension     takes meds daily  . BPH (benign prostatic hyperplasia)   . H/O exercise stress test     about 5 yrs. ago, saw cardiologist, had normal stress test, told NO need  to f/u /w cardiologist   . Depression   . History of anemia   . Colon polyps     Colonoscopy x 3 per pt--initial one with polyp but two 5 yr f/u colonoscopies normal per pt.  . Moderate to severe aortic stenosis   . Chronic leukopenia     Neutropenia, monocytosis--Dr. Marin Olp following.  . Microhematuria     Renal u/s remarkable only for small nonobstructing stone in each kidney.  Urol feels like this is likely the source of the microhem, cystoscopy likely low yield--following stones with q13mo ultrasounds and office f/u ([facility])  . Subclinical hypothyroidism 06/2014      TSH 5.2, normal T4 and T3.    Past Surgical History  Procedure Laterality Date  . Prostate surgery      TURP 1980s/90s.--HELPED.  Marland Kitchen Eye surgery      cataract - right  . Colonoscopy    . Inguinal hernia repair  12/27/2011    Procedure: HERNIA REPAIR INGUINAL ADULT;  Surgeon: Odis Hollingshead, MD;  Location: [facility];  Service: General;  Laterality: Right;  . Tonsillectomy       Medications: Current Outpatient Prescriptions  Medication Sig Dispense Refill  . amitriptyline (ELAVIL) 50 MG tablet Take 1 tablet (50 mg total) by mouth at bedtime. 90 tablet 1  . aspirin 81 MG tablet Take 81 mg by mouth daily.    Marland Kitchen ezetimibe (ZETIA) 10 MG tablet Take 1 tablet (10 mg total) by mouth daily. 90 tablet 1  . metoprolol succinate (TOPROL-XL) 50 MG 24 hr tablet Take 1 tablet (50 mg total) by mouth daily. 90 tablet 1  . tamsulosin (FLOMAX) 0.4 MG CAPS capsule Take 1 capsule (0.4 mg total) by mouth daily. 90 capsule 1   No current facility-administered medications for this visit.    Allergies: No Known Allergies  Social History: The patient  reports that he quit smoking about 31 years ago. His smoking use included Cigarettes. He has  a 32 pack-year smoking history. He does not have any smokeless tobacco history on file. He reports that he does not drink alcohol or use illicit drugs.   Family History: The patient's family history includes Arthritis in his mother; Hypertension in his father.   Review of Systems: Please see the history of present illness.    All other systems are reviewed and negative.   Physical Exam: VS:  BP 142/70 mmHg  Pulse 84  Resp 16  Ht 6' (1.829 m)  Wt 187 lb (84.823 kg)  BMI 25.36 kg/m2 .  BMI Body mass index is 25.36 kg/(m^2).  Wt Readings from Last 3 Encounters:  07/18/14 187 lb (84.823 kg)  06/24/14 185 lb (83.915 kg)  06/20/14 185 lb (83.915 kg)    General: Pleasant. Well developed, well nourished and in no acute distress.  HEENT: Normal. Neck:  Supple, no JVD, carotid bruits, or masses noted.  Cardiac: Regular rate and rhythm. He has an outflow murmur noted.  No edema.  Respiratory:  Lungs are clear to auscultation bilaterally with normal work of breathing.  GI: Soft and nontender.  MS: No deformity or atrophy. Gait and ROM intact. Skin: Warm and dry. Color is normal.  Neuro:  Strength and sensation are intact and no gross focal deficits noted.  Psych: Alert, appropriate and with normal affect.   LABORATORY DATA:  EKG:  EKG is not ordered today.   Lab Results  Component Value Date   WBC 2.1* 06/24/2014   HGB 17.1 06/24/2014   HCT 48.0 06/24/2014   PLT 127* 06/24/2014   GLUCOSE 89 06/20/2014   CHOL 127 06/20/2014   TRIG 111.0 06/20/2014   HDL 34.80* 06/20/2014   LDLCALC 70 06/20/2014   ALT 19 06/20/2014   AST 21 06/20/2014   NA 139 06/20/2014   K 5.1 06/20/2014   CL 106 06/20/2014   CREATININE 2.00* 06/20/2014   BUN 32* 06/20/2014   CO2 30 06/20/2014   TSH 5.17* 06/20/2014   INR 1.04 12/20/2011   MICROALBUR 29.4* 03/27/2013    BNP (last 3 results) No results for input(s): BNP in the last 8760 hours.  ProBNP (last 3 results) No results for input(s): PROBNP in the last 8760 hours.   Other Studies Reviewed Today:  Echo 08/21/13: Left ventricle: The cavity size was normal. Wall thickness was increased in a pattern of mild LVH. Systolic function was vigorous. The estimated ejection fraction was in the range of 65% to 70%. Wall motion was normal; there were no regional wall motion abnormalities. Doppler parameters are consistent with abnormal left ventricular relaxation (grade 1 diastolic dysfunction). - Aortic valve: Trileaflet; severely calcified leaflets. There was moderate to severe stenosis. Mild regurgitation. Mean gradient: 62mm Hg (S). Peak gradient: 59mm Hg (S). Valve area:1.03cm^2(VTI). - Aorta: Mild aortic root dilation. Aortic root dimension: 26mm (ED). - Mitral valve: Moderately calcified  annulus. Mildly calcified leaflets . No significant regurgitation. - Left atrium: The atrium was mildly dilated. - Right ventricle: The cavity size was normal. Systolic function was normal. - Right atrium: The atrium was mildly dilated. - Pulmonary arteries: No complete TR doppler jet so unable to estimate PA systolic pressure. - Inferior vena cava: The vessel was normal in size; the respirophasic diameter changes were in the normal range (= 50%); findings are consistent with normal central venous pressure. Impressions:  - Normal LV size and vigorous systolic function, EF [id number]. Mild LV hypertrophy. Heavily calcified aortic valve with moderate to severe stenosis. Aortic valve area and  gradient were difficult to obtain given frequent ectopy during study. Normal RV size and systolic function.   Assessment and Plan:   1. Aortic stenosis: He has moderate to severe AS by echo May 2015. He is very active. He has no cardinal symptoms at this time. Will get his echo updated. He is felt to still be a candidate for traditional AVR at this time if needed but he would also be considered for TAVR in the future if his surgical risk was felt to be too high.   2. HTN: BP is slightly elevated today. Followed in primary care. For now, no change in therapy.   3. HLD: He is on Zetia. Followed in primary care.   Current medicines are reviewed with the patient today.  The patient does not have concerns regarding medicines other than what has been noted above.  The following changes have been made:  See above.  Labs/ tests ordered today include:    Orders Placed This Encounter  Procedures  . 2D Echocardiogram without contrast     Disposition:   FU with Dr. Angelena Form in 1 year.  Patient is agreeable to this plan and will call if any problems develop in the interim.   Signed: Burtis Junes, RN, ANP-C 07/18/2014 8:31 AM  [facility] [address] [place], [place]  [address] Phone: [phone] Fax: [phone]

## 2014-07-22 ENCOUNTER — Ambulatory Visit (HOSPITAL_COMMUNITY): Payer: Medicare Other | Attending: Cardiology | Admitting: Cardiology

## 2014-07-22 DIAGNOSIS — I350 Nonrheumatic aortic (valve) stenosis: Secondary | ICD-10-CM | POA: Diagnosis not present

## 2014-07-22 NOTE — Progress Notes (Signed)
Echo performed. 

## 2014-07-29 ENCOUNTER — Telehealth: Payer: Self-pay | Admitting: *Deleted

## 2014-07-29 ENCOUNTER — Other Ambulatory Visit (HOSPITAL_BASED_OUTPATIENT_CLINIC_OR_DEPARTMENT_OTHER): Payer: Medicare Other

## 2014-07-29 ENCOUNTER — Encounter: Payer: Self-pay | Admitting: Hematology & Oncology

## 2014-07-29 ENCOUNTER — Ambulatory Visit (HOSPITAL_BASED_OUTPATIENT_CLINIC_OR_DEPARTMENT_OTHER): Payer: Medicare Other | Admitting: Hematology & Oncology

## 2014-07-29 VITALS — BP 184/70 | HR 73 | Temp 98.4°F | Resp 18 | Ht 72.0 in | Wt 188.0 lb

## 2014-07-29 DIAGNOSIS — D72819 Decreased white blood cell count, unspecified: Secondary | ICD-10-CM

## 2014-07-29 DIAGNOSIS — D696 Thrombocytopenia, unspecified: Secondary | ICD-10-CM | POA: Diagnosis not present

## 2014-07-29 LAB — CBC WITH DIFFERENTIAL (CANCER CENTER ONLY)
BASO#: 0.1 10*3/uL (ref 0.0–0.2)
BASO%: 3.2 % — ABNORMAL HIGH (ref 0.0–2.0)
EOS%: 6.9 % (ref 0.0–7.0)
Eosinophils Absolute: 0.2 10*3/uL (ref 0.0–0.5)
HCT: 48.1 % (ref 38.7–49.9)
HGB: 16.9 g/dL (ref 13.0–17.1)
LYMPH#: 0.9 10*3/uL (ref 0.9–3.3)
LYMPH%: 41.2 % (ref 14.0–48.0)
MCH: 30.3 pg (ref 28.0–33.4)
MCHC: 35.1 g/dL (ref 32.0–35.9)
MCV: 86 fL (ref 82–98)
MONO#: 0.5 10*3/uL (ref 0.1–0.9)
MONO%: 25 % — ABNORMAL HIGH (ref 0.0–13.0)
NEUT#: 0.5 10*3/uL — CL (ref 1.5–6.5)
NEUT%: 23.7 % — ABNORMAL LOW (ref 40.0–80.0)
Platelets: 120 10*3/uL — ABNORMAL LOW (ref 145–400)
RBC: 5.58 10*6/uL (ref 4.20–5.70)
RDW: 14.4 % (ref 11.1–15.7)
WBC: 2.2 10*3/uL — ABNORMAL LOW (ref 4.0–10.0)

## 2014-07-29 LAB — CHCC SATELLITE - SMEAR

## 2014-07-29 NOTE — Progress Notes (Signed)
Hematology and Oncology Follow Up Visit  Richard Sullivan [id number] 1930-08-23 81 y.o. 07/29/2014   Principle Diagnosis:   Leukopenia and thrombocytopenia  Current Therapy:    Observation     Interim History:  Richard Sullivan is back for follow-up. We did go ahead and get a ultrasound of his abdomen. He had mild splenomegaly. There was no issue with his liver. There is no obvious cirrhosis. There is no adenopathy.  He is having some issues with his aortic valve. I'm not sure when he is due for another echocardiogram. His last one was done back in May 2015. It did show significant aortic stenosis.  He's had no fever. He's had no shortness of breath. He's had no abdominal pain. He's had no change in bowel or bladder habits. He's had a bruising. He's had no bleeding.  Overall, his performance status is ECOG 1.  Medications:  Current outpatient prescriptions:  .  amitriptyline (ELAVIL) 50 MG tablet, Take 1 tablet (50 mg total) by mouth at bedtime., Disp: 90 tablet, Rfl: 1 .  aspirin 81 MG tablet, Take 81 mg by mouth daily., Disp: , Rfl:  .  ezetimibe (ZETIA) 10 MG tablet, Take 1 tablet (10 mg total) by mouth daily., Disp: 90 tablet, Rfl: 1 .  metoprolol succinate (TOPROL-XL) 50 MG 24 hr tablet, Take 1 tablet (50 mg total) by mouth daily., Disp: 90 tablet, Rfl: 1 .  tamsulosin (FLOMAX) 0.4 MG CAPS capsule, Take 1 capsule (0.4 mg total) by mouth daily., Disp: 90 capsule, Rfl: 1  Allergies: No Known Allergies  Past Medical History, Surgical history, Social history, and Family History were reviewed and updated.  Review of Systems: As above  Physical Exam:  height is 6' (1.829 m) and weight is 188 lb (85.276 kg). His oral temperature is 98.4 F (36.9 C). His blood pressure is 184/70 and his pulse is 73. His respiration is 18.   Wt Readings from Last 3 Encounters:  07/29/14 188 lb (85.276 kg)  07/18/14 187 lb (84.823 kg)  06/24/14 185 lb (83.915 kg)     Elderly white  showman. Head and neck exam shows no ocular or oral lesions. There are no palpable cervical or supraclavicular lymph nodes. Lungs are clear. Cardiac exam regular rate and rhythm with a 2/6 systolic murmurs. . Abdomen is soft. He has good bowel sounds. There is no fluid wave. There is no palpable liver. His splint might be palpable with deep inspiration. Back exam shows no tenderness over the spine, ribs or hips. Skin exam shows no rashes, ecchymoses or petechia. Neurological exam is nonfocal.  Lab Results  Component Value Date   WBC 2.2* 07/29/2014   HGB 16.9 07/29/2014   HCT 48.1 07/29/2014   MCV 86 07/29/2014   PLT 120* 07/29/2014     Chemistry      Component Value Date/Time   NA 139 06/20/2014 0831   K 5.1 06/20/2014 0831   CL 106 06/20/2014 0831   CO2 30 06/20/2014 0831   BUN 32* 06/20/2014 0831   CREATININE 2.00* 06/20/2014 0831      Component Value Date/Time   CALCIUM 9.9 06/20/2014 0831   ALKPHOS 76 06/20/2014 0831   AST 21 06/20/2014 0831   ALT 19 06/20/2014 0831   BILITOT 1.2 06/20/2014 0831         Impression and Plan: Richard Sullivan is 81 year old white male. He has leukopenia and thrombocytopenia.  I looked at his blood smear. I do not see anything that looked unusual.  He had no immature myeloid or lymphoid forms. He had no hyper segmented polys.  He does have some slight splenomegaly on ultrasound.  Since he is asymptomatic, I really think we can just watch him for right now.  I am not sure as to what the issues are with his aortic valve. However, if he does need any kind of surgery, we can certainly work with the cardiologist or thoracic surgeons to help optimize his blood counts.  I want to get him back now in about 3 months. I this would be reasonable for follow-up.   Volanda Napoleon, MD 4/12/201610:16 AM

## 2014-07-29 NOTE — Telephone Encounter (Signed)
Critical value ANC 0.5  Dr Marin Olp notified. No orders received

## 2014-08-11 ENCOUNTER — Telehealth: Payer: Self-pay | Admitting: Family Medicine

## 2014-08-11 MED ORDER — AMITRIPTYLINE HCL 50 MG PO TABS: 50.0000 mg | ORAL_TABLET | Freq: Every day | ORAL | Status: DC

## 2014-08-11 MED ORDER — EZETIMIBE 10 MG PO TABS: 10.0000 mg | ORAL_TABLET | Freq: Every day | ORAL | Status: DC

## 2014-08-11 MED ORDER — METOPROLOL SUCCINATE ER 50 MG PO TB24: 50.0000 mg | ORAL_TABLET | Freq: Every day | ORAL | Status: DC

## 2014-08-11 MED ORDER — TAMSULOSIN HCL 0.4 MG PO CAPS: 0.4000 mg | ORAL_CAPSULE | Freq: Every day | ORAL | Status: DC

## 2014-08-11 NOTE — Telephone Encounter (Signed)
Amitriptyline, Zetia, Metoprolol, Tamsulosin [address]

## 2014-08-11 NOTE — Telephone Encounter (Signed)
rx's sent for 90 day supply.

## 2014-09-19 ENCOUNTER — Ambulatory Visit (INDEPENDENT_AMBULATORY_CARE_PROVIDER_SITE_OTHER): Payer: Medicare Other | Admitting: Family Medicine

## 2014-09-19 ENCOUNTER — Encounter: Payer: Self-pay | Admitting: Family Medicine

## 2014-09-19 VITALS — BP 136/71 | HR 61 | Temp 97.7°F | Resp 16 | Wt 188.0 lb

## 2014-09-19 DIAGNOSIS — E785 Hyperlipidemia, unspecified: Secondary | ICD-10-CM

## 2014-09-19 DIAGNOSIS — I10 Essential (primary) hypertension: Secondary | ICD-10-CM

## 2014-09-19 DIAGNOSIS — R809 Proteinuria, unspecified: Secondary | ICD-10-CM

## 2014-09-19 DIAGNOSIS — N183 Chronic kidney disease, stage 3 unspecified: Secondary | ICD-10-CM

## 2014-09-19 DIAGNOSIS — Z23 Encounter for immunization: Secondary | ICD-10-CM

## 2014-09-19 DIAGNOSIS — E038 Other specified hypothyroidism: Secondary | ICD-10-CM

## 2014-09-19 DIAGNOSIS — I350 Nonrheumatic aortic (valve) stenosis: Secondary | ICD-10-CM

## 2014-09-19 DIAGNOSIS — E039 Hypothyroidism, unspecified: Secondary | ICD-10-CM

## 2014-09-19 DIAGNOSIS — Z0000 Encounter for general adult medical examination without abnormal findings: Secondary | ICD-10-CM

## 2014-09-19 LAB — T4, FREE: Free T4: 0.81 ng/dL (ref 0.60–1.60)

## 2014-09-19 LAB — T3, FREE: T3, Free: 3.4 pg/mL (ref 2.3–4.2)

## 2014-09-19 LAB — TSH: TSH: 3.88 u[IU]/mL (ref 0.35–4.50)

## 2014-09-19 NOTE — Progress Notes (Signed)
Pre visit review using our clinic review tool, if applicable. No additional management support is needed unless otherwise documented below in the visit note. 

## 2014-09-19 NOTE — Progress Notes (Signed)
OFFICE VISIT  09/27/2014   CC:  Chief Complaint  Patient presents with  . Follow-up    3 month f/u. Pt is fasting.   HPI:    Patient is a 81 y.o. Caucasian male who presents for 10 mo f/u HTN, hyperlip, CRI stage III. Feeling welll, push mowing his lawn.  Repeat echo 07/2014 showed AS now severe, but since patient asymptomatic cardiology is repeating echo and o/v in 3 mo.  F/u with Dr. Marin Olp for chronic leukopenia: stable since I last saw him.  Home bp monitoring shows consistently normal bp, HR in 60s. Compliant with bp med and zetia, no side effects.   Past Medical History  Diagnosis Date  . Hyperlipidemia   . Chronic renal insufficiency, stage III (moderate)     CrCl@30  ml/min.  ?nephrotic syndrome in 33s?--no old records.  . Gout     Doing ok since off allopurinol 2015  . Tachyarrhythmia   . Hypertension     takes meds daily  . BPH (benign prostatic hyperplasia)   . H/O exercise stress test     about 5 yrs. ago, saw cardiologist, had normal stress test, told NO need  to f/u /w cardiologist   . Depression   . History of anemia   . Colon polyps     Colonoscopy x 3 per pt--initial one with polyp but two 5 yr f/u colonoscopies normal per pt.  . Moderate to severe aortic stenosis   . Chronic leukopenia     Neutropenia, monocytosis--Dr. Marin Olp following.  . Microhematuria     Renal u/s remarkable only for small nonobstructing stone in each kidney.  Urol feels like this is likely the source of the microhem, cystoscopy likely low yield--following stones with q56mo ultrasounds and office f/u ([facility])  . Subclinical hypothyroidism 06/2014    TSH 5.2, normal T4 and T3.    Past Surgical History  Procedure Laterality Date  . Prostate surgery      TURP 1980s/90s.--HELPED.  Marland Kitchen Eye surgery      cataract - right  . Colonoscopy    . Inguinal hernia repair  12/27/2011    Procedure: HERNIA REPAIR INGUINAL ADULT;  Surgeon: Odis Hollingshead, MD;  Location: [facility];  Service:  General;  Laterality: Right;  . Tonsillectomy    . Transthoracic echocardiogram  07/22/14    EF 55-60%, AS worse than prior echo: mean gradient 44, peak >100: f/u 3 mo per cardiologist    Outpatient Prescriptions Prior to Visit  Medication Sig Dispense Refill  . amitriptyline (ELAVIL) 50 MG tablet Take 1 tablet (50 mg total) by mouth at bedtime. 90 tablet 0  . aspirin 81 MG tablet Take 81 mg by mouth daily.    Marland Kitchen ezetimibe (ZETIA) 10 MG tablet Take 1 tablet (10 mg total) by mouth daily. 90 tablet 0  . metoprolol succinate (TOPROL-XL) 50 MG 24 hr tablet Take 1 tablet (50 mg total) by mouth daily. 90 tablet 0  . tamsulosin (FLOMAX) 0.4 MG CAPS capsule Take 1 capsule (0.4 mg total) by mouth daily. 90 capsule 0   No facility-administered medications prior to visit.    No Known Allergies  ROS As per HPI  PE: Blood pressure 136/71, pulse 61, temperature 97.7 F (36.5 C), temperature source Oral, resp. rate 16, weight 188 lb (85.276 kg), SpO2 98 %. Gen: Alert, well appearing.  Patient is oriented to person, place, time, and situation. NECK: no carotid bruits CV: RRR, 4/6 systolic murmur, S1 and S2 obscured, no  diastolic murmur Chest is clear, no wheezing or rales. Normal symmetric air entry throughout both lung fields. No chest wall deformities or tenderness. EXT: no clubbing, cyanosis, or edema.    LABS:  Lab Results  Component Value Date   TSH 3.88 09/19/2014   Lab Results  Component Value Date   CHOL 127 06/20/2014   HDL 34.80* 06/20/2014   LDLCALC 70 06/20/2014   TRIG 111.0 06/20/2014   CHOLHDL 4 06/20/2014     Chemistry      Component Value Date/Time   NA 139 06/20/2014 0831   K 5.1 06/20/2014 0831   CL 106 06/20/2014 0831   CO2 30 06/20/2014 0831   BUN 32* 06/20/2014 0831   CREATININE 2.00* 06/20/2014 0831      Component Value Date/Time   CALCIUM 9.9 06/20/2014 0831   ALKPHOS 76 06/20/2014 0831   AST 21 06/20/2014 0831   ALT 19 06/20/2014 0831   BILITOT 1.2  06/20/2014 0831     Lab Results  Component Value Date   WBC 2.2* 07/29/2014   HGB 16.9 07/29/2014   HCT 48.1 07/29/2014   MCV 86 07/29/2014   PLT 120* 07/29/2014    IMPRESSION AND PLAN:  1) HTN; The current medical regimen is effective;  continue present plan and medications. Lytes/cr today.  2) CRI stage III, with hx of microalbuminuria: check urine microalb/cr today as well as BMET.  3) Subclinical hypothyroidism: recheck TSH, T4, T3 today.  4) Severe aortic stenosis: asymptomatic.  Per cardiology, repeat echo and f/u with cardiology in 3 mo.  5) Hyperlipidemia; lipids good 06/2014.  6) Preventative health care: Tdap today. He is not interested in any further colon cancer screening.  An After Visit Summary was printed and given to the patient.  FOLLOW UP: Return in about 6 months (around 03/21/2015) for routine chronic illness f/u.

## 2014-09-20 LAB — MICROALBUMIN, URINE: Microalb, Ur: 77.2 mg/dL — ABNORMAL HIGH (ref <2.0)

## 2014-09-22 MED ORDER — LISINOPRIL 5 MG PO TABS: 5.0000 mg | ORAL_TABLET | Freq: Every day | ORAL | Status: DC

## 2014-09-27 ENCOUNTER — Encounter: Payer: Self-pay | Admitting: Family Medicine

## 2014-10-10 ENCOUNTER — Other Ambulatory Visit: Payer: Medicare Other

## 2014-10-14 ENCOUNTER — Other Ambulatory Visit (INDEPENDENT_AMBULATORY_CARE_PROVIDER_SITE_OTHER): Payer: Medicare Other

## 2014-10-14 DIAGNOSIS — R809 Proteinuria, unspecified: Secondary | ICD-10-CM | POA: Diagnosis not present

## 2014-10-14 DIAGNOSIS — I10 Essential (primary) hypertension: Secondary | ICD-10-CM | POA: Diagnosis not present

## 2014-10-14 LAB — BASIC METABOLIC PANEL
BUN: 32 mg/dL — ABNORMAL HIGH (ref 6–23)
CO2: 27 mEq/L (ref 19–32)
Calcium: 9.6 mg/dL (ref 8.4–10.5)
Chloride: 108 mEq/L (ref 96–112)
Creatinine, Ser: 2.05 mg/dL — ABNORMAL HIGH (ref 0.40–1.50)
GFR: 33.22 mL/min — ABNORMAL LOW (ref >60.00)
Glucose, Bld: 87 mg/dL (ref 70–99)
Potassium: 4.8 mEq/L (ref 3.5–5.1)
Sodium: 138 mEq/L (ref 135–145)

## 2014-10-17 DIAGNOSIS — I4892 Unspecified atrial flutter: Secondary | ICD-10-CM

## 2014-10-17 HISTORY — DX: Unspecified atrial flutter: I48.92

## 2014-10-23 ENCOUNTER — Encounter: Payer: Self-pay | Admitting: Cardiovascular Disease

## 2014-10-23 ENCOUNTER — Telehealth: Payer: Self-pay

## 2014-10-23 ENCOUNTER — Ambulatory Visit (INDEPENDENT_AMBULATORY_CARE_PROVIDER_SITE_OTHER): Payer: Medicare Other | Admitting: Cardiovascular Disease

## 2014-10-23 VITALS — BP 132/70 | HR 79 | Ht 72.0 in | Wt 188.8 lb

## 2014-10-23 DIAGNOSIS — I10 Essential (primary) hypertension: Secondary | ICD-10-CM

## 2014-10-23 DIAGNOSIS — I483 Typical atrial flutter: Secondary | ICD-10-CM | POA: Diagnosis not present

## 2014-10-23 DIAGNOSIS — I350 Nonrheumatic aortic (valve) stenosis: Secondary | ICD-10-CM

## 2014-10-23 LAB — BASIC METABOLIC PANEL
BUN: 31 mg/dL — ABNORMAL HIGH (ref 6–23)
CO2: 26 mEq/L (ref 19–32)
Calcium: 9.7 mg/dL (ref 8.4–10.5)
Chloride: 106 mEq/L (ref 96–112)
Creatinine, Ser: 2.13 mg/dL — ABNORMAL HIGH (ref 0.40–1.50)
GFR: 31.78 mL/min — ABNORMAL LOW (ref >60.00)
Glucose, Bld: 76 mg/dL (ref 70–99)
Potassium: 5 mEq/L (ref 3.5–5.1)
Sodium: 137 mEq/L (ref 135–145)

## 2014-10-23 LAB — CBC WITH DIFFERENTIAL/PLATELET
Basophils Relative: 0 % (ref 0.0–3.0)
Eosinophils Relative: 2 % (ref 0.0–5.0)
HCT: 46 % (ref 39.0–52.0)
Hemoglobin: 15.7 g/dL (ref 13.0–17.0)
Lymphocytes Relative: 64 % — ABNORMAL HIGH (ref 12.0–46.0)
MCHC: 34.2 g/dL (ref 30.0–36.0)
MCV: 87.7 fl (ref 78.0–100.0)
Monocytes Relative: 21 % — ABNORMAL HIGH (ref 3.0–12.0)
Neutrophils Relative %: 12 % — ABNORMAL LOW (ref 43.0–77.0)
Platelets: 159 10*3/uL (ref 150.0–400.0)
RBC: 5.25 Mil/uL (ref 4.22–5.81)
RDW: 14.2 % (ref 11.5–15.5)
WBC: 1.8 10*3/uL — CL (ref 4.0–10.5)

## 2014-10-23 MED ORDER — APIXABAN 2.5 MG PO TABS: 2.5000 mg | ORAL_TABLET | Freq: Two times a day (BID) | ORAL | Status: DC

## 2014-10-23 NOTE — Telephone Encounter (Signed)
Baseline. Followed by Hematology. cdm

## 2014-10-23 NOTE — Progress Notes (Signed)
Chief Complaint  Patient presents with  . Follow-up      History of Present Illness: 81 yo male with history of HTN, HLD, CKD and aortic stenosis here today for cardiac follow up. I saw him in June 2015 for evaluation of aortic stenosis. Echo 08/21/13 in primary care with moderate to severe AS (mean gradient 37 mm Hg). At our first visit in 2015 he had no symptoms. He denied chest pain, SOB, palpitations, near syncope or syncope. Repeat echo 4/5//16 with slight progression of aortic stenosis.   He is here today for follow up. He has been feeling well without chest pain, SOB, LE edema, dizziness, near syncope or syncope. EKG today with atrial flutter. He has had no palpitations.   Primary Care Physician: McGowen  Last Lipid Profile:Lipid Panel     Component Value Date/Time   CHOL 127 06/20/2014 0831   TRIG 111.0 06/20/2014 0831   HDL 34.80* 06/20/2014 0831   CHOLHDL 4 06/20/2014 0831   VLDL 22.2 06/20/2014 0831   LDLCALC 70 06/20/2014 0831     Past Medical History  Diagnosis Date  . Hyperlipidemia   . Chronic renal insufficiency, stage III (moderate)     CrCl@30  ml/min.  ?nephrotic syndrome in 76s?--no old records.  . Gout     Doing ok since off allopurinol 2015  . Tachyarrhythmia   . Hypertension     takes meds daily  . BPH (benign prostatic hyperplasia)   . H/O exercise stress test     about 5 yrs. ago, saw cardiologist, had normal stress test, told NO need  to f/u /w cardiologist   . Depression   . History of anemia   . Colon polyps     Colonoscopy x 3 per pt--initial one with polyp but two 5 yr f/u colonoscopies normal per pt.  . Moderate to severe aortic stenosis   . Chronic leukopenia     Neutropenia, monocytosis--Dr. Marin Olp following.  . Microhematuria     Renal u/s remarkable only for small nonobstructing stone in each kidney.  Urol feels like this is likely the source of the microhem, cystoscopy likely low yield--following stones with q72mo ultrasounds and  office f/u ([facility])  . Subclinical hypothyroidism 06/2014    TSH 5.2, normal T4 and T3.    Past Surgical History  Procedure Laterality Date  . Prostate surgery      TURP 1980s/90s.--HELPED.  Marland Kitchen Eye surgery      cataract - right  . Colonoscopy    . Inguinal hernia repair  12/27/2011    Procedure: HERNIA REPAIR INGUINAL ADULT;  Surgeon: Odis Hollingshead, MD;  Location: [facility];  Service: General;  Laterality: Right;  . Tonsillectomy    . Transthoracic echocardiogram  07/22/14    EF 55-60%, AS worse than prior echo: mean gradient 44, peak >100: f/u 3 mo per cardiologist    Current Outpatient Prescriptions  Medication Sig Dispense Refill  . amitriptyline (ELAVIL) 50 MG tablet Take 1 tablet (50 mg total) by mouth at bedtime. 90 tablet 0  . ezetimibe (ZETIA) 10 MG tablet Take 1 tablet (10 mg total) by mouth daily. 90 tablet 0  . lisinopril (PRINIVIL,ZESTRIL) 5 MG tablet Take 1 tablet (5 mg total) by mouth daily. 90 tablet 3  . metoprolol succinate (TOPROL-XL) 50 MG 24 hr tablet Take 1 tablet (50 mg total) by mouth daily. 90 tablet 0  . tamsulosin (FLOMAX) 0.4 MG CAPS capsule Take 1 capsule (0.4 mg total) by mouth daily.  90 capsule 0  . apixaban (ELIQUIS) 2.5 MG TABS tablet Take 1 tablet (2.5 mg total) by mouth 2 (two) times daily. 60 tablet 6   No current facility-administered medications for this visit.    No Known Allergies  History   Social History  . Marital Status: Married    Spouse Name: N/A  . Number of Children: 1  . Years of Education: N/A   Occupational History  . Retired    Social History Main Topics  . Smoking status: Former Smoker -- 1.00 packs/day for 32 years    Types: Cigarettes    Quit date: 12/05/1982  . Smokeless tobacco: Not on file     Comment: quit 34 years ago  . Alcohol Use: No  . Drug Use: No  . Sexual Activity: Not on file   Other Topics Concern  . Not on file   Social History Narrative   Married, has one son.   Orig from [place], [place].    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   [employer] education.   Tob 30 pack-yr hx, quit 1984.   Alcohol-none    Drugso-none   Likes to hunt and fish.  Still mows yard with push mower.    Family History  Problem Relation Age of Onset  . Arthritis Mother   . Hypertension Father     Review of Systems:  As stated in the HPI and otherwise negative.   BP 132/70 mmHg  Pulse 79  Ht 6' (1.829 m)  Wt 188 lb 12.8 oz (85.639 kg)  BMI 25.60 kg/m2  SpO2 99%  Physical Examination: General: Well developed, well nourished, NAD HEENT: OP clear, mucus membranes moist SKIN: warm, dry. No rashes. Neuro: No focal deficits Musculoskeletal: Muscle strength 5/5 all ext Psychiatric: Mood and affect normal Neck: No JVD, no carotid bruits, no thyromegaly, no lymphadenopathy. Lungs:Clear bilaterally, no wheezes, rhonci, crackles Cardiovascular: Regular rate and rhythm. Harsh, loud systolic murmur. No gallops or rubs. Abdomen:Soft. Bowel sounds present. Non-tender.  Extremities: No lower extremity edema. Pulses are 2 + in the bilateral DP/PT.  Echo 07/22/14: Left ventricle: The cavity size was normal. Wall thickness was increased in a pattern of moderate LVH. Systolic function was normal. The estimated ejection fraction was in the range of 55% to 60%. - Aortic valve: There was severe stenosis. There was mild regurgitation. - Mitral valve: Calcified annulus. There was mild regurgitation. - Left atrium: The atrium was mildly dilated. - Right atrium: The atrium was mildly dilated. - Atrial septum: No defect or patent foramen ovale was identified.  EKG:  EKG is ordered today. The ekg ordered today demonstrates atrial flutter, rate 79 bpm. PVC  Recent Labs: 06/20/2014: ALT 19 09/19/2014: TSH 3.88 10/23/2014: BUN 31*; Creatinine, Ser 2.13*; Hemoglobin 15.7; Platelets 159.0; Potassium 5.0; Sodium 137   Lipid Panel    Component Value Date/Time   CHOL 127 06/20/2014 0831   TRIG 111.0 06/20/2014 0831    HDL 34.80* 06/20/2014 0831   CHOLHDL 4 06/20/2014 0831   VLDL 22.2 06/20/2014 0831   LDLCALC 70 06/20/2014 0831     Wt Readings from Last 3 Encounters:  10/23/14 188 lb 12.8 oz (85.639 kg)  09/19/14 188 lb (85.276 kg)  07/29/14 188 lb (85.276 kg)     Other studies Reviewed: Additional studies/ records that were reviewed today include: . Review of the above records demonstrates:    Assessment and Plan:   1. Aortic stenosis: He has severe AS by echo April 2016. He is  very active. He has no signs of CHF. He has no chest pain, SOB, dizziness. At this time, he is asymptomatic and there is no indication for AVR. He would likely not be a candidate for traditional AVR at this time if needed but we could consider TAVR if he became symptomatic.    2. HTN: BP is controlled. No changes today.    3. HLD: He is on Zetia. Followed in primary care.   4. Atrial flutter: Rate controlled today on Toprol. New diagnosis. I reviewed risk of CVA. His CHADSVASC score is 3 with annual risk of stroke of 3.2 %. Will start Eliquis 2.5 mg po BID (over 63 yo and creat over 1.5). I will see him back in 4-6 weeks.   Current medicines are reviewed at length with the patient today.  The patient does not have concerns regarding medicines.  The following changes have been made:  Added Eliquis  Labs/ tests ordered today include:   Orders Placed This Encounter  Procedures  . Basic Metabolic Panel (BMET)  . CBC w/Diff  . EKG 12-Lead    Disposition:   FU with me in 6 weeks  Signed, Lauree Chandler, MD 10/23/2014 5:16 PM    [place], [place], [place]  [address] Phone: [phone]; Fax: [phone]

## 2014-10-23 NOTE — Telephone Encounter (Signed)
Lab Critical result received from lab , WBC 1.8 received with results verbally given to [facility] RN working with Dr. Tamela Gammon.  Lab will be faxing results but said that it will be a few minutes because they are doing a smear.

## 2014-10-23 NOTE — Patient Instructions (Addendum)
Medication Instructions:  Your physician has recommended you make the following change in your medication: Start Eliquis 2.5 mg by mouth twice daily.  Stop aspirin   Labwork: Lab work to be done today--BMP, CBC  Testing/Procedures: none  Follow-Up: Your physician recommends that you schedule a follow-up appointment in: 6 weeks with Dr. Azucena Freed for December 04, 2014 at 9:30

## 2014-10-27 ENCOUNTER — Encounter: Payer: Self-pay | Admitting: Hematology & Oncology

## 2014-10-27 ENCOUNTER — Ambulatory Visit (HOSPITAL_BASED_OUTPATIENT_CLINIC_OR_DEPARTMENT_OTHER): Payer: Medicare Other | Admitting: Hematology & Oncology

## 2014-10-27 ENCOUNTER — Other Ambulatory Visit (HOSPITAL_BASED_OUTPATIENT_CLINIC_OR_DEPARTMENT_OTHER): Payer: Medicare Other

## 2014-10-27 VITALS — BP 146/50 | HR 67 | Temp 97.8°F | Resp 18 | Ht 72.0 in | Wt 190.0 lb

## 2014-10-27 DIAGNOSIS — D72819 Decreased white blood cell count, unspecified: Secondary | ICD-10-CM

## 2014-10-27 DIAGNOSIS — D696 Thrombocytopenia, unspecified: Secondary | ICD-10-CM | POA: Diagnosis not present

## 2014-10-27 LAB — CBC WITH DIFFERENTIAL (CANCER CENTER ONLY)
BASO#: 0.1 10*3/uL (ref 0.0–0.2)
BASO%: 3.5 % — ABNORMAL HIGH (ref 0.0–2.0)
EOS%: 2.1 % (ref 0.0–7.0)
Eosinophils Absolute: 0 10*3/uL (ref 0.0–0.5)
HCT: 43 % (ref 38.7–49.9)
HGB: 15.2 g/dL (ref 13.0–17.1)
LYMPH#: 0.8 10*3/uL — ABNORMAL LOW (ref 0.9–3.3)
LYMPH%: 58.9 % — ABNORMAL HIGH (ref 14.0–48.0)
MCH: 30.6 pg (ref 28.0–33.4)
MCHC: 35.3 g/dL (ref 32.0–35.9)
MCV: 87 fL (ref 82–98)
MONO#: 0.4 10*3/uL (ref 0.1–0.9)
MONO%: 31.2 % — ABNORMAL HIGH (ref 0.0–13.0)
NEUT#: 0.1 10*3/uL — CL (ref 1.5–6.5)
NEUT%: 4.3 % — ABNORMAL LOW (ref 40.0–80.0)
Platelets: 135 10*3/uL — ABNORMAL LOW (ref 145–400)
RBC: 4.96 10*6/uL (ref 4.20–5.70)
RDW: 14.2 % (ref 11.1–15.7)
WBC: 1.4 10*3/uL — ABNORMAL LOW (ref 4.0–10.0)

## 2014-10-27 LAB — RETICULOCYTES (CHCC)
ABS Retic: 85.2 10*3/uL (ref 19.0–186.0)
RBC.: 5.01 MIL/uL (ref 4.22–5.81)
Retic Ct Pct: 1.7 % (ref 0.4–2.3)

## 2014-10-27 LAB — CHCC SATELLITE - SMEAR

## 2014-10-27 NOTE — Progress Notes (Signed)
Hematology and Oncology Follow Up Visit  KENTAVIOUS GRABINSKI [id number] Jan 22, 1931 81 y.o. 10/27/2014   Principle Diagnosis:   Leukopenia and thrombocytopenia  Current Therapy:    Observation     Interim History:  Mr. Gourdin is back for follow-up. He is doing okay. We last saw him back in April.  Over the weekend, got stung 5 times by yellow jackets. He had no problems with this. There is no infections at the sites of the attack.  His white cell count has trended downward. He had his labs done recently which showed a white cell count of 1.8. Again, I'm not too worried about this as he has been totally asymptomatic.  He's had no bleeding. He is on ELIQUIS for atrial fibrillation.  His been no change in bowel or bladder habits.  Overall, his performance status is ECOG 1.  Medications:  Current outpatient prescriptions:  .  amitriptyline (ELAVIL) 50 MG tablet, Take 1 tablet (50 mg total) by mouth at bedtime., Disp: 90 tablet, Rfl: 0 .  apixaban (ELIQUIS) 2.5 MG TABS tablet, Take 1 tablet (2.5 mg total) by mouth 2 (two) times daily., Disp: 60 tablet, Rfl: 6 .  ezetimibe (ZETIA) 10 MG tablet, Take 1 tablet (10 mg total) by mouth daily., Disp: 90 tablet, Rfl: 0 .  lisinopril (PRINIVIL,ZESTRIL) 5 MG tablet, Take 1 tablet (5 mg total) by mouth daily., Disp: 90 tablet, Rfl: 3 .  metoprolol succinate (TOPROL-XL) 50 MG 24 hr tablet, Take 1 tablet (50 mg total) by mouth daily., Disp: 90 tablet, Rfl: 0 .  tamsulosin (FLOMAX) 0.4 MG CAPS capsule, Take 1 capsule (0.4 mg total) by mouth daily., Disp: 90 capsule, Rfl: 0  Allergies: No Known Allergies  Past Medical History, Surgical history, Social history, and Family History were reviewed and updated.  Review of Systems: As above  Physical Exam:  height is 6' (1.829 m) and weight is 190 lb (86.183 kg). His oral temperature is 97.8 F (36.6 C). His blood pressure is 146/50 and his pulse is 67. His respiration is 18.   Wt Readings  from Last 3 Encounters:  10/27/14 190 lb (86.183 kg)  10/23/14 188 lb 12.8 oz (85.639 kg)  09/19/14 188 lb (85.276 kg)     Elderly white showman. Head and neck exam shows no ocular or oral lesions. There are no palpable cervical or supraclavicular lymph nodes. Lungs are clear. Cardiac exam regular rate and rhythm with a 2/6 systolic murmurs. . Abdomen is soft. He has good bowel sounds. There is no fluid wave. There is no palpable liver. His splint might be palpable with deep inspiration. Back exam shows no tenderness over the spine, ribs or hips. Skin exam shows no rashes, ecchymoses or petechia. He does have the sites of the bee stings. There is minimal swelling. There is no erythema or tenderness. Neurological exam is nonfocal.  Lab Results  Component Value Date   WBC 1.4* 10/27/2014   HGB 15.2 10/27/2014   HCT 43.0 10/27/2014   MCV 87 10/27/2014   PLT 135* 10/27/2014     Chemistry      Component Value Date/Time   NA 137 10/23/2014 1541   K 5.0 10/23/2014 1541   CL 106 10/23/2014 1541   CO2 26 10/23/2014 1541   BUN 31* 10/23/2014 1541   CREATININE 2.13* 10/23/2014 1541      Component Value Date/Time   CALCIUM 9.7 10/23/2014 1541   ALKPHOS 76 06/20/2014 0831   AST 21 06/20/2014 0831  ALT 19 06/20/2014 0831   BILITOT 1.2 06/20/2014 0831         Impression and Plan: Mr. Mcclelland is 81 year old white male. He has leukopenia and thrombocytopenia.  I looked at his blood smear. I do not see anything that looked unusual. He had no immature myeloid or lymphoid forms. He had no hyper segmented polys.  He does have some slight splenomegaly on physical exam but nothing that appears to be worse.  Since he is asymptomatic, I really think we can just watch him for right now.  II want to get him back now in about 3 months. I think this would be reasonable for follow-up.   Volanda Napoleon, MD 7/11/201611:32 AM

## 2014-10-28 LAB — FERRITIN CHCC: Ferritin: 216 ng/ml (ref 22–316)

## 2014-10-28 LAB — IRON AND TIBC CHCC
%SAT: 32 % (ref 20–55)
Iron: 76 ug/dL (ref 42–163)
TIBC: 241 ug/dL (ref 202–409)
UIBC: 165 ug/dL (ref 117–376)

## 2014-11-10 ENCOUNTER — Other Ambulatory Visit: Payer: Self-pay | Admitting: *Deleted

## 2014-11-10 MED ORDER — EZETIMIBE 10 MG PO TABS: 10.0000 mg | ORAL_TABLET | Freq: Every day | ORAL | Status: DC

## 2014-11-10 MED ORDER — AMITRIPTYLINE HCL 50 MG PO TABS: 50.0000 mg | ORAL_TABLET | Freq: Every day | ORAL | Status: DC

## 2014-11-10 MED ORDER — TAMSULOSIN HCL 0.4 MG PO CAPS: 0.4000 mg | ORAL_CAPSULE | Freq: Every day | ORAL | Status: DC

## 2014-11-10 MED ORDER — METOPROLOL SUCCINATE ER 50 MG PO TB24: 50.0000 mg | ORAL_TABLET | Freq: Every day | ORAL | Status: DC

## 2014-11-10 NOTE — Telephone Encounter (Signed)
LOV: 09/19/14 NOV: 03/20/15  RF request for Zetia Last written: 08/11/14 #90 w/ [id number]  RF request for metoprolol Last written: 08/11/14 390 w/ 0RF  RF request for tamsulosin Last written: 08/11/14 #90 w/ [id number]  RF request for amitriptyline Last written: 08/11/14 #90 w/ 0RF

## 2014-12-03 NOTE — Progress Notes (Signed)
Chief Complaint  Patient presents with  . Follow-up     History of Present Illness: 81 yo male with history of HTN, HLD, CKD and aortic stenosis here today for cardiac follow up. I saw him in June 2015 for evaluation of aortic stenosis. Echo 08/21/13 in primary care with moderate to severe AS (mean gradient 37 mm Hg). At our first visit in 2015 he had no symptoms. He denied chest pain, SOB, palpitations, near syncope or syncope. Repeat echo 4/5//16 with slight progression of aortic stenosis. At his last visit in our office 10/23/14 he was found to be in atrial flutter. He had no palpitations and no awareness of arrhythmia. Eliquis was started for anticoagulation.   He is here today for follow up. He has been feeling well without chest pain, SOB, LE edema, dizziness, near syncope or syncope.    Primary Care Physician: McGowen  Last Lipid Profile:Lipid Panel     Component Value Date/Time   CHOL 127 06/20/2014 0831   TRIG 111.0 06/20/2014 0831   HDL 34.80* 06/20/2014 0831   CHOLHDL 4 06/20/2014 0831   VLDL 22.2 06/20/2014 0831   LDLCALC 70 06/20/2014 0831     Past Medical History  Diagnosis Date  . Hyperlipidemia   . Chronic renal insufficiency, stage III (moderate)     CrCl@30  ml/min.  ?nephrotic syndrome in 1s?--no old records.  . Gout     Doing ok since off allopurinol 2015  . Tachyarrhythmia   . Hypertension     takes meds daily  . BPH (benign prostatic hyperplasia)   . H/O exercise stress test     about 5 yrs. ago, saw cardiologist, had normal stress test, told NO need  to f/u /w cardiologist   . Depression   . History of anemia   . Colon polyps     Colonoscopy x 3 per pt--initial one with polyp but two 5 yr f/u colonoscopies normal per pt.  . Moderate to severe aortic stenosis   . Chronic leukopenia     Neutropenia, monocytosis--Dr. Marin Olp following.  . Microhematuria     Renal u/s remarkable only for small nonobstructing stone in each kidney.  Urol feels like  this is likely the source of the microhem, cystoscopy likely low yield--following stones with q30mo ultrasounds and office f/u ([facility])  . Subclinical hypothyroidism 06/2014    TSH 5.2, normal T4 and T3.    Past Surgical History  Procedure Laterality Date  . Prostate surgery      TURP 1980s/90s.--HELPED.  [facility] Eye surgery      cataract - right  . Colonoscopy    . Inguinal hernia repair  12/27/2011    Procedure: HERNIA REPAIR INGUINAL ADULT;  Surgeon: Odis Hollingshead, MD;  Location: [place];  Service: General;  Laterality: Right;  . Tonsillectomy    . Transthoracic echocardiogram  07/22/14    EF 55-60%, AS worse than prior echo: mean gradient 44, peak >100: f/u 3 mo per cardiologist    Current Outpatient Prescriptions  Medication Sig Dispense Refill  . amitriptyline (ELAVIL) 50 MG tablet Take 1 tablet (50 mg total) by mouth at bedtime. 90 tablet 1  . apixaban (ELIQUIS) 2.5 MG TABS tablet Take 1 tablet (2.5 mg total) by mouth 2 (two) times daily. 60 tablet 6  . ezetimibe (ZETIA) 10 MG tablet Take 1 tablet (10 mg total) by mouth daily. 90 tablet 1  . lisinopril (PRINIVIL,ZESTRIL) 5 MG tablet Take 1 tablet (5 mg total) by mouth  daily. 90 tablet 3  . metoprolol succinate (TOPROL-XL) 50 MG 24 hr tablet Take 1 tablet (50 mg total) by mouth daily. 90 tablet 1  . tamsulosin (FLOMAX) 0.4 MG CAPS capsule Take 1 capsule (0.4 mg total) by mouth daily. 90 capsule 1   No current facility-administered medications for this visit.    No Known Allergies  Social History   Social History  . Marital Status: Married    Spouse Name: N/A  . Number of Children: 1  . Years of Education: N/A   Occupational History  . Retired    Social History Main Topics  . Smoking status: Former Smoker -- 1.00 packs/day for 32 years    Types: Cigarettes    Quit date: 12/05/1982  . Smokeless tobacco: Not on file  . Alcohol Use: No  . Drug Use: No  . Sexual Activity: Not on file   Other Topics Concern  . Not on  file   Social History Narrative   Married, has one son.   Orig from [place], [place].   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   [employer] education.   Tob 30 pack-yr hx, quit 1984.   Alcohol-none    Drugso-none   Likes to hunt and fish.  Still mows yard with push mower.    Family History  Problem Relation Age of Onset  . Arthritis Mother   . Hypertension Father     Review of Systems:  As stated in the HPI and otherwise negative.   BP 120/80 mmHg  Pulse 65  Ht 6' (1.829 m)  Wt 182 lb 1.9 oz (82.609 kg)  BMI 24.69 kg/m2  SpO2 99%  Physical Examination: General: Well developed, well nourished, NAD HEENT: OP clear, mucus membranes moist SKIN: warm, dry. No rashes. Neuro: No focal deficits Musculoskeletal: Muscle strength 5/5 all ext Psychiatric: Mood and affect normal Neck: No JVD, no carotid bruits, no thyromegaly, no lymphadenopathy. Lungs:Clear bilaterally, no wheezes, rhonci, crackles Cardiovascular: Regular rate and rhythm. Harsh, loud systolic murmur. No gallops or rubs. Abdomen:Soft. Bowel sounds present. Non-tender.  Extremities: No lower extremity edema. Pulses are 2 + in the bilateral DP/PT.  Echo 07/22/14: Left ventricle: The cavity size was normal. Wall thickness was increased in a pattern of moderate LVH. Systolic function was normal. The estimated ejection fraction was in the range of 55% to 60%. - Aortic valve: There was severe stenosis. There was mild regurgitation. - Mitral valve: Calcified annulus. There was mild regurgitation. - Left atrium: The atrium was mildly dilated. - Right atrium: The atrium was mildly dilated. - Atrial septum: No defect or patent foramen ovale was identified.  EKG:  EKG is ordered today. The ekg ordered today demonstrates atrial flutter, rate 65 bpm. Non-specific ST abnormalities  Recent Labs: 06/20/2014: ALT 19 09/19/2014: TSH 3.88 10/23/2014: BUN 31*; Creatinine, Ser 2.13*; Potassium 5.0; Sodium 137 10/27/2014: HGB 15.2;  Platelets 135*   Lipid Panel    Component Value Date/Time   CHOL 127 06/20/2014 0831   TRIG 111.0 06/20/2014 0831   HDL 34.80* 06/20/2014 0831   CHOLHDL 4 06/20/2014 0831   VLDL 22.2 06/20/2014 0831   LDLCALC 70 06/20/2014 0831     Wt Readings from Last 3 Encounters:  12/04/14 182 lb 1.9 oz (82.609 kg)  10/27/14 190 lb (86.183 kg)  10/23/14 188 lb 12.8 oz (85.639 kg)     Other studies Reviewed: Additional studies/ records that were reviewed today include: . Review of the above records demonstrates:    Assessment  and Plan:   1. Aortic stenosis: He has severe AS by echo April 2016. He is very active. He has no signs of CHF. He has no chest pain, SOB, dizziness. At this time, he is asymptomatic and there is no indication for AVR. He would likely not be a candidate for traditional AVR at this time if needed but we could consider TAVR if he became symptomatic.    2. HTN: BP is controlled. No changes today.    3. HLD: He is on Zetia. Followed in primary care.   4. Atrial flutter: Rate controlled today on Toprol. I reviewed risk of CVA. His CHADSVASC score is 3 with annual risk of stroke of 3.2 %. Will continue Eliquis 2.5 mg po BID.   Current medicines are reviewed at length with the patient today.  The patient does not have concerns regarding medicines.  The following changes have been made:  Added Eliquis  Labs/ tests ordered today include:   Orders Placed This Encounter  Procedures  . EKG 12-Lead  . Echocardiogram    Disposition:   FU with me in 6 weeks  Signed, Lauree Chandler, MD 12/04/2014 9:58 AM    [facility] [address], [place], [place]  [address] Phone: [phone]; Fax: [phone]

## 2014-12-04 ENCOUNTER — Ambulatory Visit (INDEPENDENT_AMBULATORY_CARE_PROVIDER_SITE_OTHER): Payer: Medicare Other | Admitting: Cardiovascular Disease

## 2014-12-04 ENCOUNTER — Encounter: Payer: Self-pay | Admitting: Cardiovascular Disease

## 2014-12-04 VITALS — BP 120/80 | HR 65 | Ht 72.0 in | Wt 182.1 lb

## 2014-12-04 DIAGNOSIS — I10 Essential (primary) hypertension: Secondary | ICD-10-CM

## 2014-12-04 DIAGNOSIS — I483 Typical atrial flutter: Secondary | ICD-10-CM

## 2014-12-04 DIAGNOSIS — I350 Nonrheumatic aortic (valve) stenosis: Secondary | ICD-10-CM

## 2014-12-04 DIAGNOSIS — E785 Hyperlipidemia, unspecified: Secondary | ICD-10-CM

## 2014-12-04 NOTE — Patient Instructions (Signed)
Medication Instructions:  Your physician recommends that you continue on your current medications as directed. Please refer to the Current Medication list given to you today.   Labwork: none  Testing/Procedures: Your physician has requested that you have an echocardiogram. Echocardiography is a painless test that uses sound waves to create images of your heart. It provides your doctor with information about the size and shape of your heart and how well your heart's chambers and valves are working. This procedure takes approximately one hour. There are no restrictions for this procedure. To be done in April 2017    Follow-Up: Your physician wants you to follow-up in: May 2017 You will receive a reminder letter in the mail two months in advance. If you don't receive a letter, please call our office to schedule the follow-up appointment.   Any Other Special Instructions Will Be Listed Below (If Applicable).

## 2015-01-27 ENCOUNTER — Ambulatory Visit (HOSPITAL_BASED_OUTPATIENT_CLINIC_OR_DEPARTMENT_OTHER): Payer: Medicare Other | Admitting: Family

## 2015-01-27 ENCOUNTER — Other Ambulatory Visit (HOSPITAL_BASED_OUTPATIENT_CLINIC_OR_DEPARTMENT_OTHER): Payer: Medicare Other

## 2015-01-27 ENCOUNTER — Encounter: Payer: Self-pay | Admitting: Family

## 2015-01-27 ENCOUNTER — Telehealth: Payer: Self-pay | Admitting: *Deleted

## 2015-01-27 VITALS — BP 133/67 | HR 80 | Temp 97.8°F | Resp 16 | Ht 72.0 in | Wt 181.0 lb

## 2015-01-27 DIAGNOSIS — D72819 Decreased white blood cell count, unspecified: Secondary | ICD-10-CM

## 2015-01-27 DIAGNOSIS — D696 Thrombocytopenia, unspecified: Secondary | ICD-10-CM | POA: Diagnosis not present

## 2015-01-27 LAB — CBC WITH DIFFERENTIAL (CANCER CENTER ONLY)
BASO#: 0 10*3/uL (ref 0.0–0.2)
BASO%: 3.2 % — ABNORMAL HIGH (ref 0.0–2.0)
EOS%: 2.4 % (ref 0.0–7.0)
Eosinophils Absolute: 0 10*3/uL (ref 0.0–0.5)
HCT: 40.7 % (ref 38.7–49.9)
HGB: 14.1 g/dL (ref 13.0–17.1)
LYMPH#: 0.8 10*3/uL — ABNORMAL LOW (ref 0.9–3.3)
LYMPH%: 61.1 % — ABNORMAL HIGH (ref 14.0–48.0)
MCH: 29.1 pg (ref 28.0–33.4)
MCHC: 34.6 g/dL (ref 32.0–35.9)
MCV: 84 fL (ref 82–98)
MONO#: 0.4 10*3/uL (ref 0.1–0.9)
MONO%: 31 % — ABNORMAL HIGH (ref 0.0–13.0)
NEUT#: 0 10*3/uL — CL (ref 1.5–6.5)
NEUT%: 2.3 % — ABNORMAL LOW (ref 40.0–80.0)
Platelets: 108 10*3/uL — ABNORMAL LOW (ref 145–400)
RBC: 4.84 10*6/uL (ref 4.20–5.70)
RDW: 13.6 % (ref 11.1–15.7)
WBC: 1.3 10*3/uL — ABNORMAL LOW (ref 4.0–10.0)

## 2015-01-27 LAB — CHCC SATELLITE - SMEAR

## 2015-01-27 NOTE — Progress Notes (Signed)
Hematology and Oncology Follow Up Visit  Richard Sullivan [id number] 07-29-1930 81 y.o. 01/27/2015   Principle Diagnosis:  Leukopenia and thrombocytopenia  Current Therapy:   Observation     Interim History:  Richard Sullivan is here today for a follow-up. He is doing well and has no complaints at this time.  His WBC count today is 1.3 with an ANC of 0.0. He has had no issue with infections. He is staying active and doing little projects around his home.  He denies fever, chills, n/v, cough, rash, dizziness, SOB, chest pain, palpitations, abdominal pain or changes in bowel or bladder habits. No splenomegaly or lymphadenopathy found on exam.  He has had no episodes of bleeding or bruising. He continues to take Eliquis for atrial fib.  He states that he is eating well and stays hydrated. His weight is stable.  No swelling, tenderness, numbness or tingling in his extremities. No c/o joint or "boney" pain.   Medications:    Medication List       This list is accurate as of: 01/27/15  1:33 PM.  Always use your most recent med list.               amitriptyline 50 MG tablet  Commonly known as:  ELAVIL  Take 1 tablet (50 mg total) by mouth at bedtime.     apixaban 2.5 MG Tabs tablet  Commonly known as:  ELIQUIS  Take 1 tablet (2.5 mg total) by mouth 2 (two) times daily.     ezetimibe 10 MG tablet  Commonly known as:  ZETIA  Take 1 tablet (10 mg total) by mouth daily.     lisinopril 5 MG tablet  Commonly known as:  PRINIVIL,ZESTRIL  Take 1 tablet (5 mg total) by mouth daily.     metoprolol succinate 50 MG 24 hr tablet  Commonly known as:  TOPROL-XL  Take 1 tablet (50 mg total) by mouth daily.     tamsulosin 0.4 MG Caps capsule  Commonly known as:  FLOMAX  Take 1 capsule (0.4 mg total) by mouth daily.        Allergies: No Known Allergies  Past Medical History, Surgical history, Social history, and Family History were reviewed and updated.  Review of  Systems: All other 10 point review of systems is negative.   Physical Exam:  height is 6' (1.829 m) and weight is 181 lb (82.101 kg). His oral temperature is 97.8 F (36.6 C). His blood pressure is 133/67 and his pulse is 80. His respiration is 16.   Wt Readings from Last 3 Encounters:  01/27/15 181 lb (82.101 kg)  12/04/14 182 lb 1.9 oz (82.609 kg)  10/27/14 190 lb (86.183 kg)    Ocular: Sclerae unicteric, pupils equal, round and reactive to light Ear-nose-throat: Oropharynx clear, dentition fair Lymphatic: No cervical or supraclavicular adenopathy Lungs no rales or rhonchi, good excursion bilaterally Heart regular rate and rhythm, no murmur appreciated Abd soft, nontender, positive bowel sounds MSK no focal spinal tenderness, no joint edema Neuro: non-focal, well-oriented, appropriate affect Breasts: Deferred  Lab Results  Component Value Date   WBC 1.3* 01/27/2015   HGB 14.1 01/27/2015   HCT 40.7 01/27/2015   MCV 84 01/27/2015   PLT 108* 01/27/2015   Lab Results  Component Value Date   FERRITIN 216 10/27/2014   IRON 76 10/27/2014   TIBC 241 10/27/2014   UIBC 165 10/27/2014   IRONPCTSAT 32 10/27/2014   Lab Results  Component Value Date  RETICCTPCT 1.7 10/27/2014   RBC 4.84 01/27/2015   RETICCTABS 85.2 10/27/2014   No results found for: KPAFRELGTCHN, LAMBDASER, KAPLAMBRATIO No results found for: IGGSERUM, IGA, IGMSERUM No results found for: Odetta Pink, SPEI   Chemistry      Component Value Date/Time   NA 137 10/23/2014 1541   K 5.0 10/23/2014 1541   CL 106 10/23/2014 1541   CO2 26 10/23/2014 1541   BUN 31* 10/23/2014 1541   CREATININE 2.13* 10/23/2014 1541      Component Value Date/Time   CALCIUM 9.7 10/23/2014 1541   ALKPHOS 76 06/20/2014 0831   AST 21 06/20/2014 0831   ALT 19 06/20/2014 0831   BILITOT 1.2 06/20/2014 0831     Impression and Plan: Richard Sullivan is 81 yo white male with  leukopenia and thrombocytopenia. He continues to do well and is asymptomatic at this time.  His WBC count today is 1.3 with an ANC of 0.0. No anemia. Dr. Marin Olp viewed his blood smear and did not identify any abnormality or evidence of malignancy.  No splenomegaly on exam.  We will continue to follow along with him and plan to see him back in 3 months.  He knows to contact us with any questions or concerns. We can certainly see him sooner if need be.   Eliezer Bottom, NP 10/11/20161:33 PM

## 2015-01-27 NOTE — Telephone Encounter (Signed)
Critical Value ANC 0.0 Dr Marin Olp aware. No orders at this time

## 2015-03-20 ENCOUNTER — Encounter: Payer: Self-pay | Admitting: Family Medicine

## 2015-03-20 ENCOUNTER — Ambulatory Visit (INDEPENDENT_AMBULATORY_CARE_PROVIDER_SITE_OTHER): Payer: Medicare Other | Admitting: Family Medicine

## 2015-03-20 VITALS — BP 153/82 | HR 63 | Temp 97.4°F | Resp 16 | Ht 72.0 in | Wt 174.0 lb

## 2015-03-20 DIAGNOSIS — D709 Neutropenia, unspecified: Secondary | ICD-10-CM

## 2015-03-20 DIAGNOSIS — Z23 Encounter for immunization: Secondary | ICD-10-CM

## 2015-03-20 DIAGNOSIS — N183 Chronic kidney disease, stage 3 unspecified: Secondary | ICD-10-CM

## 2015-03-20 DIAGNOSIS — E785 Hyperlipidemia, unspecified: Secondary | ICD-10-CM

## 2015-03-20 DIAGNOSIS — D696 Thrombocytopenia, unspecified: Secondary | ICD-10-CM

## 2015-03-20 DIAGNOSIS — I10 Essential (primary) hypertension: Secondary | ICD-10-CM

## 2015-03-20 DIAGNOSIS — I4892 Unspecified atrial flutter: Secondary | ICD-10-CM

## 2015-03-20 LAB — BASIC METABOLIC PANEL
BUN: 19 mg/dL (ref 6–23)
CO2: 27 mEq/L (ref 19–32)
Calcium: 9.9 mg/dL (ref 8.4–10.5)
Chloride: 105 mEq/L (ref 96–112)
Creatinine, Ser: 1.96 mg/dL — ABNORMAL HIGH (ref 0.40–1.50)
GFR: 34.95 mL/min — ABNORMAL LOW (ref >60.00)
Glucose, Bld: 88 mg/dL (ref 70–99)
Potassium: 4.5 mEq/L (ref 3.5–5.1)
Sodium: 140 mEq/L (ref 135–145)

## 2015-03-20 LAB — MICROALBUMIN / CREATININE URINE RATIO
Creatinine,U: 64.5 mg/dL
Microalb Creat Ratio: 100.7 mg/g — ABNORMAL HIGH (ref 0.0–30.0)
Microalb, Ur: 64.9 mg/dL — ABNORMAL HIGH (ref 0.0–1.9)

## 2015-03-20 LAB — PHOSPHORUS: Phosphorus: 2 mg/dL — ABNORMAL LOW (ref 2.3–4.6)

## 2015-03-20 NOTE — Progress Notes (Signed)
Pre visit review using our clinic review tool, if applicable. No additional management support is needed unless otherwise documented below in the visit note. 

## 2015-03-20 NOTE — Progress Notes (Signed)
OFFICE VISIT  03/20/2015   CC:  Chief Complaint  Patient presents with  . Follow-up    Pt is fasting.    HPI:    Patient is a 81 y.o. Caucasian male who presents for 6 mo f/u HTN, HLD, CRI stage3 with +microalbuminuria. Started lisinopril 5mg  qd last visit.  No probs with this med.  We discussed possible referral to nephrologist for ongoing mgmt of his renal failure since his GFR is around 30 ml/min but pt declined at this time.  Home bp monitoring shows 120s/70s: no lows or highs.  He has purposefully lost wt lately: changed diet to lower fat, less snacking.   Activity: works around [employer] and still mows yard with push-mower, but no formal exercise regimen.  Has been feeling well.  No acute complaints.  Dr. Marin Olp following for chronic leukopenia and thrombocytopenia: no sign of blood cancer.  Plan is to f/u 3 mo.  Earlier this summer he saw cardiology for f/u and was put on eliquis for a-flutter. He denies any abnl bleeding.  ROS: no fevers, no rashes, no melena/hematochezia, no hematuria or nose bleeds, no CP, no palpitations or feeling of heart racing.  No SOB or dizziness.   Past Medical History  Diagnosis Date  . Hyperlipidemia   . Chronic renal insufficiency, stage III (moderate)     CrCl@30  ml/min.  ?nephrotic syndrome in 53s?--no old records.  . Gout     Doing ok since off allopurinol 2015  . Tachyarrhythmia   . Hypertension     takes meds daily  . BPH (benign prostatic hyperplasia)   . H/O exercise stress test     about 5 yrs. ago, saw cardiologist, had normal stress test, told NO need  to f/u /w cardiologist   . Depression   . History of anemia   . Colon polyps     Colonoscopy x 3 per pt--initial one with polyp but two 5 yr f/u colonoscopies normal per pt.  . Moderate to severe aortic stenosis   . Chronic leukopenia     Neutropenia, monocytosis--Dr. Marin Olp following.  . Microhematuria     Renal u/s remarkable only for small nonobstructing stone in  each kidney.  Urol feels like this is likely the source of the microhem, cystoscopy likely low yield--following stones with q25mo ultrasounds and office f/u ([facility])  . Subclinical hypothyroidism 06/2014    TSH 5.2, normal T4 and T3.    Past Surgical History  Procedure Laterality Date  . Prostate surgery      TURP 1980s/90s.--HELPED.  Marland Kitchen Eye surgery      cataract - right  . Colonoscopy    . Inguinal hernia repair  12/27/2011    Procedure: HERNIA REPAIR INGUINAL ADULT;  Surgeon: Odis Hollingshead, MD;  Location: [facility] [facility];  Service: General;  Laterality: Right;  . Tonsillectomy    . Transthoracic echocardiogram  07/22/14    EF 55-60%, AS worse than prior echo: mean gradient 44, peak >100: f/u 3 mo per cardiologist    Outpatient Prescriptions Prior to Visit  Medication Sig Dispense Refill  . amitriptyline (ELAVIL) 50 MG tablet Take 1 tablet (50 mg total) by mouth at bedtime. 90 tablet 1  . apixaban (ELIQUIS) 2.5 MG TABS tablet Take 1 tablet (2.5 mg total) by mouth 2 (two) times daily. 60 tablet 6  . ezetimibe (ZETIA) 10 MG tablet Take 1 tablet (10 mg total) by mouth daily. 90 tablet 1  . lisinopril (PRINIVIL,ZESTRIL) 5 MG tablet Take 1  tablet (5 mg total) by mouth daily. 90 tablet 3  . metoprolol succinate (TOPROL-XL) 50 MG 24 hr tablet Take 1 tablet (50 mg total) by mouth daily. 90 tablet 1  . tamsulosin (FLOMAX) 0.4 MG CAPS capsule Take 1 capsule (0.4 mg total) by mouth daily. 90 capsule 1   No facility-administered medications prior to visit.    No Known Allergies  ROS As per HPI  PE: Blood pressure 153/82, pulse 63, temperature 97.4 F (36.3 C), temperature source Oral, resp. rate 16, height 6' (1.829 m), weight 174 lb (78.926 kg), SpO2 100 %. Gen: Alert, well appearing.  Patient is oriented to person, place, time, and situation. CV: RRR, 3/6 systolic murmur heard over entire precordium, 1/6 diastolic murmur at apex.  No rub or gallop. Chest is clear, no wheezing or rales. Normal  symmetric air entry throughout both lung fields. No chest wall deformities or tenderness. EXT: no clubbing, cyanosis, or edema.   LABS:  Lab Results  Component Value Date   TSH 3.88 09/19/2014   Lab Results  Component Value Date   WBC 1.3* 01/27/2015   HGB 14.1 01/27/2015   HCT 40.7 01/27/2015   MCV 84 01/27/2015   PLT 108* 01/27/2015   Lab Results  Component Value Date   CREATININE 2.13* 10/23/2014   BUN 31* 10/23/2014   NA 137 10/23/2014   K 5.0 10/23/2014   CL 106 10/23/2014   CO2 26 10/23/2014   Lab Results  Component Value Date   ALT 19 06/20/2014   AST 21 06/20/2014   ALKPHOS 76 06/20/2014   BILITOT 1.2 06/20/2014   Lab Results  Component Value Date   CHOL 127 06/20/2014   Lab Results  Component Value Date   HDL 34.80* 06/20/2014   Lab Results  Component Value Date   LDLCALC 70 06/20/2014   Lab Results  Component Value Date   TRIG 111.0 06/20/2014   Lab Results  Component Value Date   CHOLHDL 4 06/20/2014   IMPRESSION AND PLAN:  1) Chronic renal insufficiency, stage III: CrCl low 30s. Will see if recent addition of low dose lisinopril has helped decrease his proteinuria: recheck urine microalb/cr today.  If not improved then will push lisinopril dose to 10mg  qd and follow lytes/cr closely. Check BMET, phos, and PTH today. Again, pt declines referral to nephrologist at this time.  2) Hyperlipidemia: last lipid panel on diet and zetia was good. Recheck FLP at next f/u in 6 mo.  3) HTN; The current medical regimen is effective;  continue present plan and medications.  4) Atrial flutter: recently dx'd by cardiologist and started on eliquis. No sign of abnl bleeding.  Today he had a regular rhythm with well-controlled HR on toprol XL.  5) Chronic leuko/neutro-penia and thrombocytopenia.  No problems with infections. No sign of malignancy per hem/onc follow up (Dr. Marin Olp, most recently 01/2015). Plans to f/u with Dr. Marin Olp again Jan or Feb  2017.  An After Visit Summary was printed and given to the patient.  FOLLOW UP: Return in about 6 months (around 09/18/2015) for routine chronic illness f/u.

## 2015-03-23 LAB — PARATHYROID HORMONE, INTACT (NO CA): PTH: 50 pg/mL (ref 14–64)

## 2015-03-24 ENCOUNTER — Other Ambulatory Visit: Payer: Self-pay | Admitting: *Deleted

## 2015-03-24 MED ORDER — LISINOPRIL 10 MG PO TABS: 10.0000 mg | ORAL_TABLET | Freq: Every day | ORAL | Status: DC

## 2015-04-05 IMAGING — US US RENAL
1 series · 14 of 25 positions shown · non-contrast
Comparison: None.

CLINICAL DATA: microhematuria and chronic renal insufficiency stage
3

EXAM:
RENAL/URINARY TRACT ULTRASOUND COMPLETE

[Series 1: us renal · 0.30mm/px · 14 of 40 slices shown]
[im 1/40]
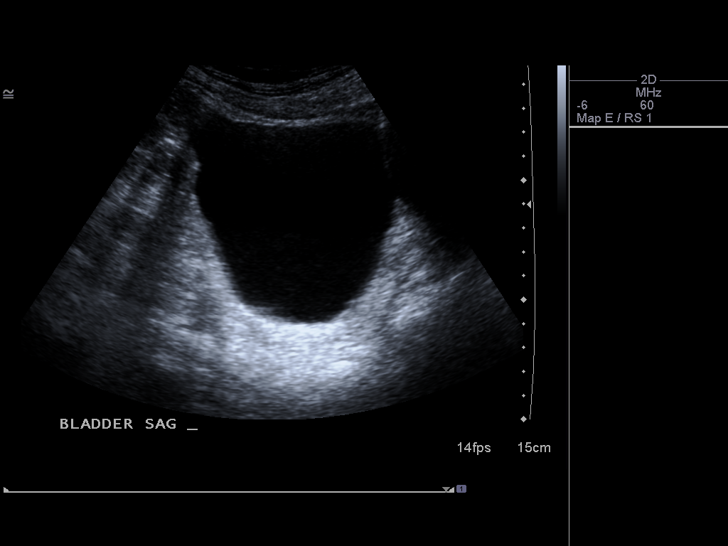
[im 4/40]
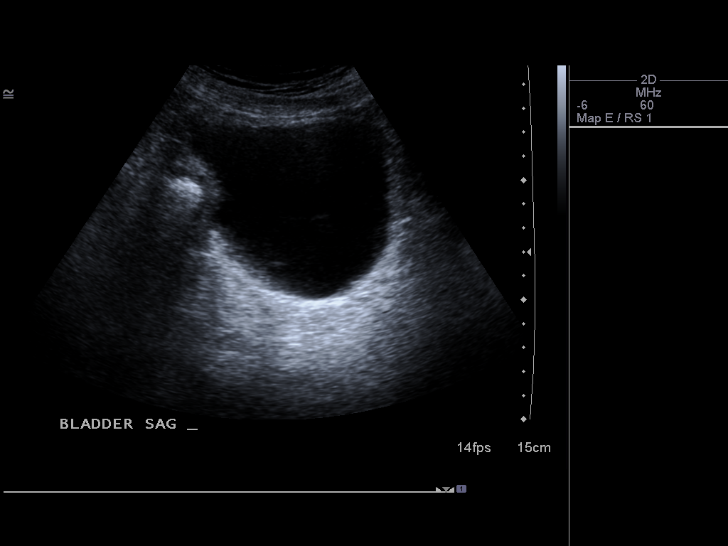
[im 7/40]
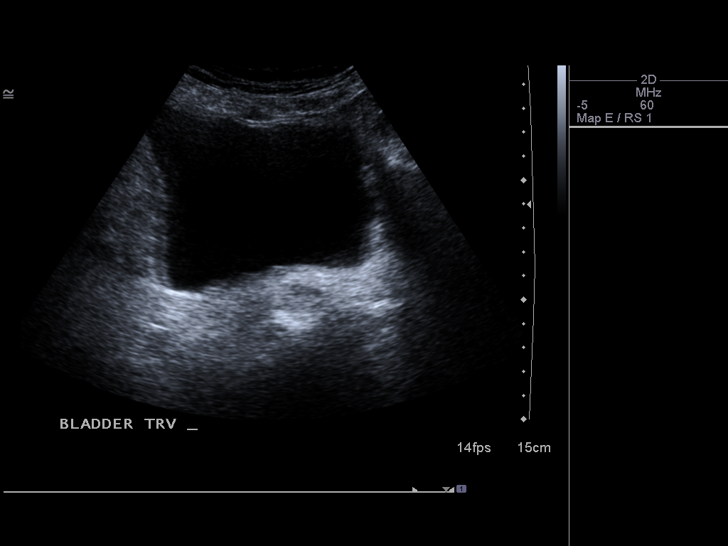
[im 10/40]
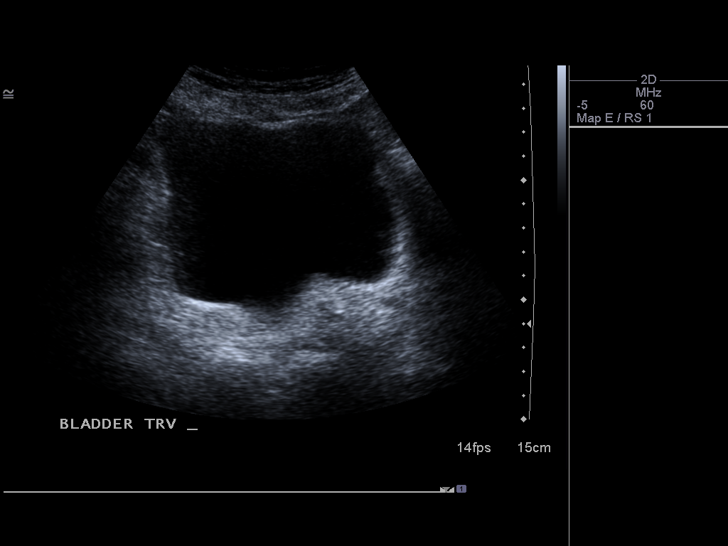
[im 14/40]
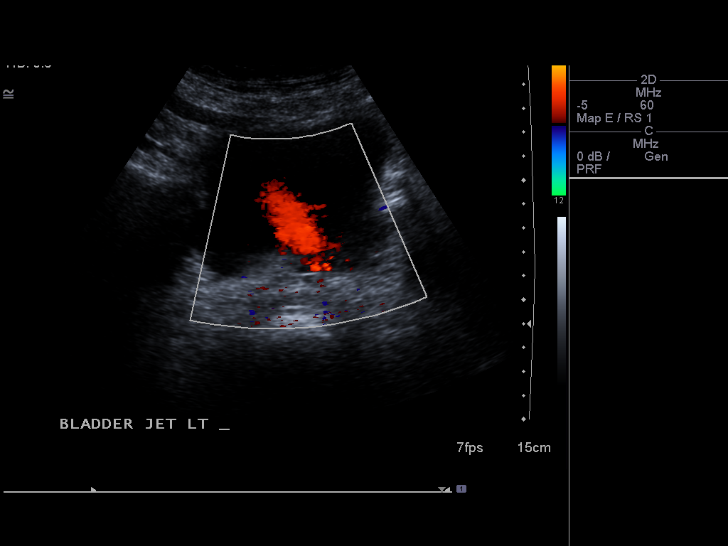
[im 15/40]
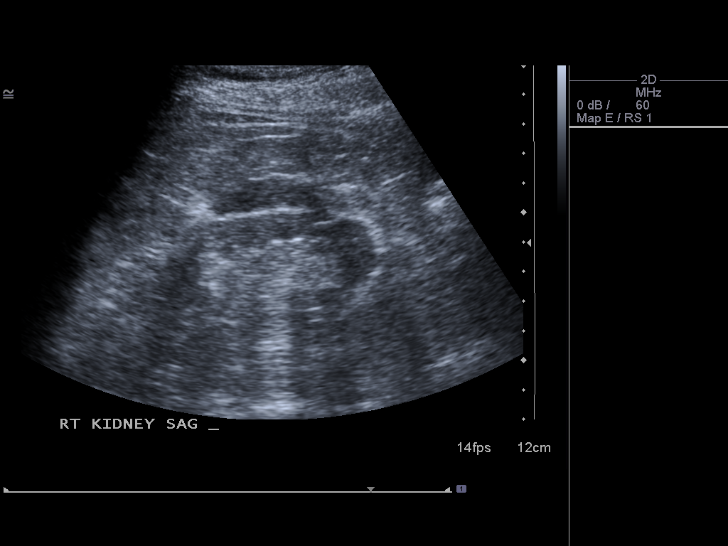
[im 18/40]
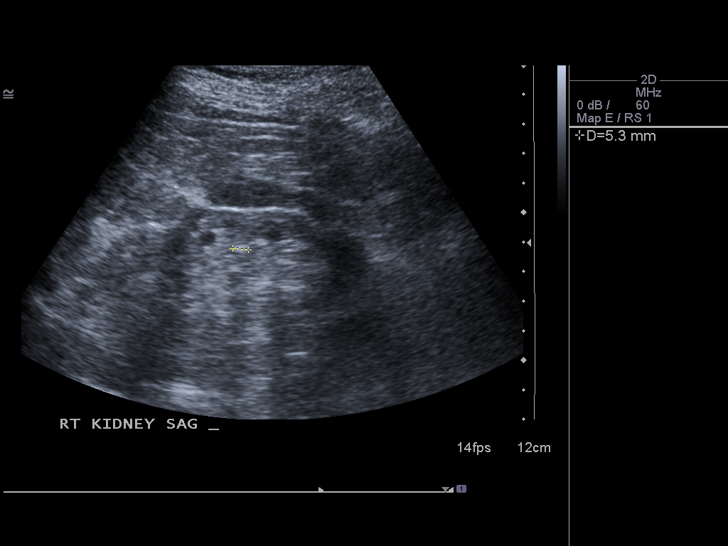
[im 22/40]
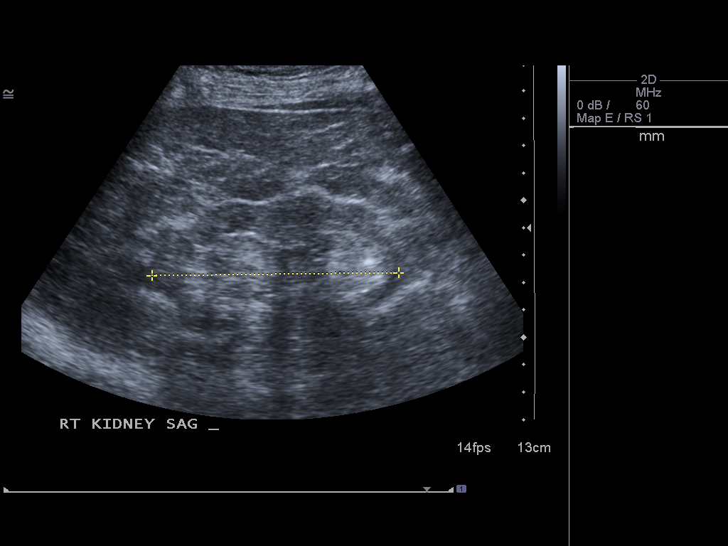
[im 25/40]
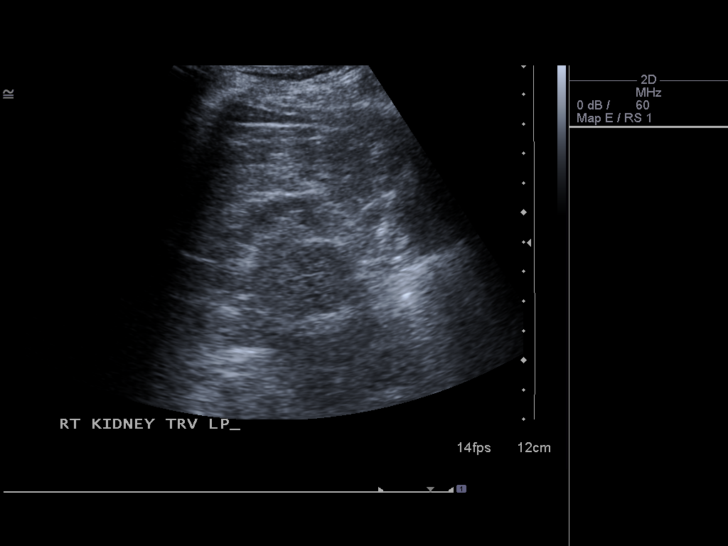
[im 27/40]
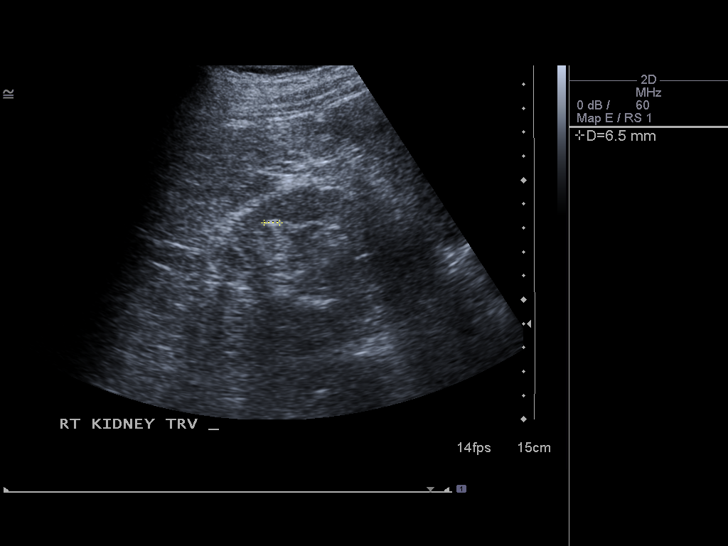
[im 30/40]
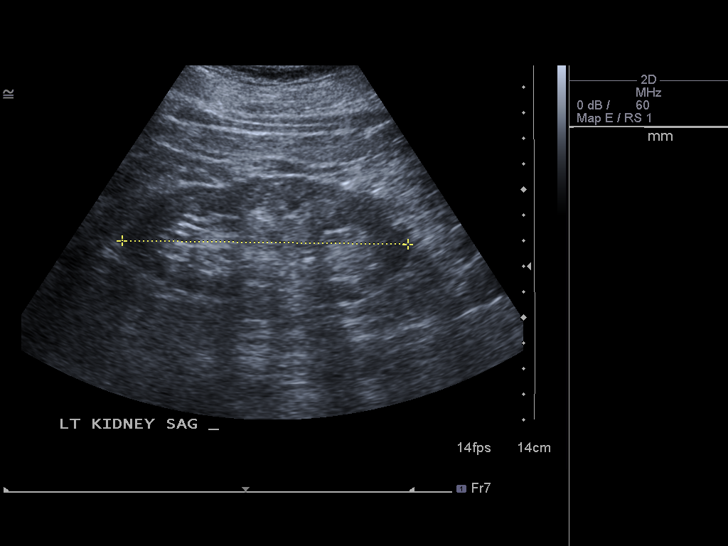
[im 33/40]
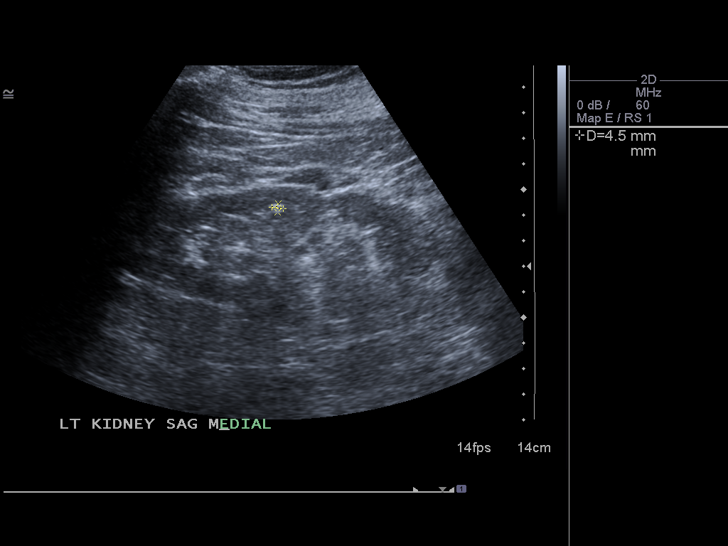
[im 36/40]
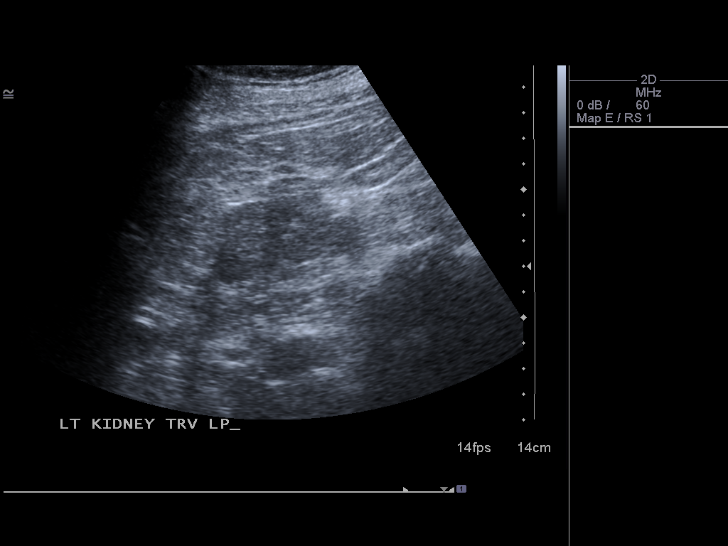
[im 40/40]
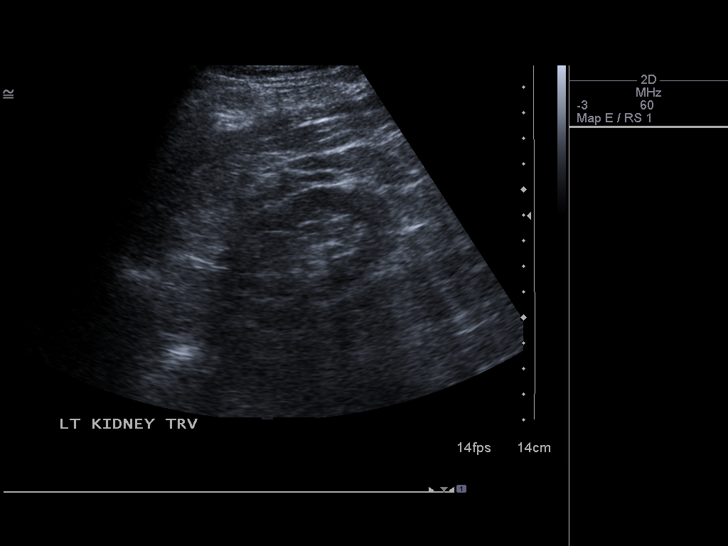

[14 of 25 positions shown; findings below may reference images not displayed]

FINDINGS: Right Kidney:

Length: 9 cm. Echogenicity within normal limits. No mass or
hydronephrosis visualized. Nonobstructing 7 mm calculus midpole.

Left Kidney:

Length: 11.2 cm. Echogenicity within normal limits. No mass or
hydronephrosis visualized. Nonobstructing 5 mm calculus midpole.

Bladder:

Appears normal for degree of bladder distention.
IMPRESSION: Bilateral nonobstructing renal calculi otherwise unremarkable renal
ultrasound.

## 2015-04-29 ENCOUNTER — Telehealth: Payer: Self-pay | Admitting: *Deleted

## 2015-04-29 ENCOUNTER — Encounter: Payer: Self-pay | Admitting: Hematology & Oncology

## 2015-04-29 ENCOUNTER — Other Ambulatory Visit (HOSPITAL_BASED_OUTPATIENT_CLINIC_OR_DEPARTMENT_OTHER): Payer: Medicare Other

## 2015-04-29 ENCOUNTER — Ambulatory Visit (HOSPITAL_BASED_OUTPATIENT_CLINIC_OR_DEPARTMENT_OTHER): Payer: Medicare Other | Admitting: Hematology & Oncology

## 2015-04-29 VITALS — BP 168/85 | HR 72 | Temp 97.6°F | Resp 16 | Ht 72.0 in | Wt 175.0 lb

## 2015-04-29 DIAGNOSIS — D72819 Decreased white blood cell count, unspecified: Secondary | ICD-10-CM

## 2015-04-29 DIAGNOSIS — D696 Thrombocytopenia, unspecified: Secondary | ICD-10-CM | POA: Insufficient documentation

## 2015-04-29 LAB — CBC WITH DIFFERENTIAL (CANCER CENTER ONLY)
BASO#: 0.1 10*3/uL (ref 0.0–0.2)
BASO%: 3.2 % — ABNORMAL HIGH (ref 0.0–2.0)
EOS%: 3.2 % (ref 0.0–7.0)
Eosinophils Absolute: 0.1 10*3/uL (ref 0.0–0.5)
HCT: 45.2 % (ref 38.7–49.9)
HGB: 15.8 g/dL (ref 13.0–17.1)
LYMPH#: 0.9 10*3/uL (ref 0.9–3.3)
LYMPH%: 42.1 % (ref 14.0–48.0)
MCH: 29 pg (ref 28.0–33.4)
MCHC: 35 g/dL (ref 32.0–35.9)
MCV: 83 fL (ref 82–98)
MONO#: 0.6 10*3/uL (ref 0.1–0.9)
MONO%: 26.9 % — ABNORMAL HIGH (ref 0.0–13.0)
NEUT#: 0.5 10*3/uL — CL (ref 1.5–6.5)
NEUT%: 24.6 % — ABNORMAL LOW (ref 40.0–80.0)
Platelets: 141 10*3/uL — ABNORMAL LOW (ref 145–400)
RBC: 5.44 10*6/uL (ref 4.20–5.70)
RDW: 14.5 % (ref 11.1–15.7)
WBC: 2.2 10*3/uL — ABNORMAL LOW (ref 4.0–10.0)

## 2015-04-29 LAB — CHCC SATELLITE - SMEAR

## 2015-04-29 NOTE — Progress Notes (Signed)
Hematology and Oncology Follow Up Visit  Richard Sullivan [id number] [birth year] 81 y.o. 04/29/2015   Principle Diagnosis:   Leukopenia and thrombocytopenia  Current Therapy:    Observation     Interim History:  Richard Sullivan is back for follow-up. He is doing okay. We last saw him back in the fall. He had a good thanks giving Christmas.  He's had no problems with fever. He's had no rashes. He's had no bleeding. He's had no change in bowel or bladder habits. He's had no cough.  There's not been any change in medications.  He continues on ELIQUIS for the atrial fibrillation.  Overall, his performance status is ECOG 1.  Medications:  Current outpatient prescriptions:  .  amitriptyline (ELAVIL) 50 MG tablet, Take 1 tablet (50 mg total) by mouth at bedtime., Disp: 90 tablet, Rfl: 1 .  apixaban (ELIQUIS) 2.5 MG TABS tablet, Take 1 tablet (2.5 mg total) by mouth 2 (two) times daily., Disp: 60 tablet, Rfl: 6 .  ezetimibe (ZETIA) 10 MG tablet, Take 1 tablet (10 mg total) by mouth daily., Disp: 90 tablet, Rfl: 1 .  lisinopril (PRINIVIL,ZESTRIL) 10 MG tablet, Take 1 tablet (10 mg total) by mouth daily., Disp: 30 tablet, Rfl: 6 .  metoprolol succinate (TOPROL-XL) 50 MG 24 hr tablet, Take 1 tablet (50 mg total) by mouth daily., Disp: 90 tablet, Rfl: 1 .  tamsulosin (FLOMAX) 0.4 MG CAPS capsule, Take 1 capsule (0.4 mg total) by mouth daily., Disp: 90 capsule, Rfl: 1  Allergies: No Known Allergies  Past Medical History, Surgical history, Social history, and Family History were reviewed and updated.  Review of Systems: As above  Physical Exam:  height is 6' (1.829 m) and weight is 175 lb (79.379 kg). His oral temperature is 97.6 F (36.4 C). His blood pressure is 168/85 and his pulse is 72. His respiration is 16.   Wt Readings from Last 3 Encounters:  04/29/15 175 lb (79.379 kg)  03/20/15 174 lb (78.926 kg)  01/27/15 181 lb (82.101 kg)     Elderly white showman. Head and neck  exam shows no ocular or oral lesions. There are no palpable cervical or supraclavicular lymph nodes. Lungs are clear. Cardiac exam regular rate and rhythm with a 2/6 systolic murmurs. . Abdomen is soft. He has good bowel sounds. There is no fluid wave. There is no palpable liver. His splint might be palpable with deep inspiration. Back exam shows no tenderness over the spine, ribs or hips. Skin exam shows no rashes, ecchymoses or petechia. He does have the sites of the bee stings. There is minimal swelling. There is no erythema or tenderness. Neurological exam is nonfocal.  Lab Results  Component Value Date   WBC 2.2* 04/29/2015   HGB 15.8 04/29/2015   HCT 45.2 04/29/2015   MCV 83 04/29/2015   PLT 141* 04/29/2015     Chemistry      Component Value Date/Time   NA 140 03/20/2015 1116   K 4.5 03/20/2015 1116   CL 105 03/20/2015 1116   CO2 27 03/20/2015 1116   BUN 19 03/20/2015 1116   CREATININE 1.96* 03/20/2015 1116      Component Value Date/Time   CALCIUM 9.9 03/20/2015 1116   ALKPHOS 76 06/20/2014 0831   AST 21 06/20/2014 0831   ALT 19 06/20/2014 0831   BILITOT 1.2 06/20/2014 0831         Impression and Plan: Richard Sullivan is 82 year old white male. He has leukopenia and thrombocytopenia. His  white cell count is a little better. His neutrophils are a little bit higher.  I looked at his blood smear. I do not see anything that looked unusual. He had no immature myeloid or lymphoid forms. He had no hyper segmented polys.  He does have some slight splenomegaly on physical exam but nothing that appears to be worse.  Since he is asymptomatic, I really think we can just watch him for right now.  II want to get him back now in about 6 months. I think this would be reasonable for follow-up.   Volanda Napoleon, MD 1/11/201710:49 AM

## 2015-04-29 NOTE — Telephone Encounter (Signed)
Critical Value ANC 0.5 Dr Marin Olp notified. No orders received.

## 2015-05-11 ENCOUNTER — Other Ambulatory Visit: Payer: Self-pay | Admitting: *Deleted

## 2015-05-11 MED ORDER — METOPROLOL SUCCINATE ER 50 MG PO TB24: 50.0000 mg | ORAL_TABLET | Freq: Every day | ORAL | Status: DC

## 2015-05-11 MED ORDER — EZETIMIBE 10 MG PO TABS: 10.0000 mg | ORAL_TABLET | Freq: Every day | ORAL | Status: DC

## 2015-05-11 MED ORDER — AMITRIPTYLINE HCL 50 MG PO TABS: 50.0000 mg | ORAL_TABLET | Freq: Every day | ORAL | Status: DC

## 2015-05-11 MED ORDER — TAMSULOSIN HCL 0.4 MG PO CAPS: 0.4000 mg | ORAL_CAPSULE | Freq: Every day | ORAL | Status: DC

## 2015-05-11 NOTE — Telephone Encounter (Signed)
LOV: 03/20/15 NOV: 09/18/15  RF request for amitriptyline Last written: 11/10/14 #90 w/ 1RF  RF request for zetia Last written: 11/10/14 #90 w/ 1RF  RF request for metoprolol Last written: 11/10/14 #90 w/ 1RF  RF request for tamsulosin Last written: 11/10/14 #90 w/ 1RF

## 2015-07-28 ENCOUNTER — Emergency Department (HOSPITAL_COMMUNITY): Payer: Medicare Other

## 2015-07-28 ENCOUNTER — Observation Stay (HOSPITAL_COMMUNITY)
Admission: EM | Admit: 2015-07-28 | Discharge: 2015-07-29 | Disposition: A | Discharge status: Home / Self Care | Payer: Medicare Other | Attending: Internal Medicine | Admitting: Internal Medicine

## 2015-07-28 ENCOUNTER — Encounter (HOSPITAL_COMMUNITY): Payer: Self-pay | Admitting: Emergency Medicine

## 2015-07-28 DIAGNOSIS — F329 Major depressive disorder, single episode, unspecified: Secondary | ICD-10-CM | POA: Diagnosis not present

## 2015-07-28 DIAGNOSIS — N183 Chronic kidney disease, stage 3 unspecified: Secondary | ICD-10-CM | POA: Diagnosis present

## 2015-07-28 DIAGNOSIS — Z87891 Personal history of nicotine dependence: Secondary | ICD-10-CM | POA: Insufficient documentation

## 2015-07-28 DIAGNOSIS — D72819 Decreased white blood cell count, unspecified: Secondary | ICD-10-CM

## 2015-07-28 DIAGNOSIS — I4891 Unspecified atrial fibrillation: Secondary | ICD-10-CM

## 2015-07-28 DIAGNOSIS — E785 Hyperlipidemia, unspecified: Secondary | ICD-10-CM | POA: Insufficient documentation

## 2015-07-28 DIAGNOSIS — I129 Hypertensive chronic kidney disease with stage 1 through stage 4 chronic kidney disease, or unspecified chronic kidney disease: Secondary | ICD-10-CM | POA: Diagnosis not present

## 2015-07-28 DIAGNOSIS — J209 Acute bronchitis, unspecified: Secondary | ICD-10-CM

## 2015-07-28 DIAGNOSIS — J42 Unspecified chronic bronchitis: Principal | ICD-10-CM

## 2015-07-28 DIAGNOSIS — Z79899 Other long term (current) drug therapy: Secondary | ICD-10-CM | POA: Insufficient documentation

## 2015-07-28 DIAGNOSIS — I10 Essential (primary) hypertension: Secondary | ICD-10-CM

## 2015-07-28 DIAGNOSIS — J40 Bronchitis, not specified as acute or chronic: Secondary | ICD-10-CM | POA: Insufficient documentation

## 2015-07-28 DIAGNOSIS — R05 Cough: Secondary | ICD-10-CM | POA: Diagnosis present

## 2015-07-28 DIAGNOSIS — R000 Tachycardia, unspecified: Secondary | ICD-10-CM | POA: Insufficient documentation

## 2015-07-28 LAB — BASIC METABOLIC PANEL
Anion gap: 8 (ref 5–15)
BUN: 31 mg/dL — ABNORMAL HIGH (ref 6–20)
CO2: 21 mmol/L — ABNORMAL LOW (ref 22–32)
Calcium: 9.5 mg/dL (ref 8.9–10.3)
Chloride: 109 mmol/L (ref 101–111)
Creatinine, Ser: 2.06 mg/dL — ABNORMAL HIGH (ref 0.61–1.24)
GFR calc Af Amer: 33 mL/min — ABNORMAL LOW (ref >60)
GFR calc non Af Amer: 28 mL/min — ABNORMAL LOW (ref >60)
Glucose, Bld: 87 mg/dL (ref 65–99)
Potassium: 4.3 mmol/L (ref 3.5–5.1)
Sodium: 138 mmol/L (ref 135–145)

## 2015-07-28 LAB — CBC
HCT: 45.1 % (ref 39.0–52.0)
Hemoglobin: 15.8 g/dL (ref 13.0–17.0)
MCH: 29.1 pg (ref 26.0–34.0)
MCHC: 35 g/dL (ref 30.0–36.0)
MCV: 83.1 fL (ref 78.0–100.0)
Platelets: 174 10*3/uL (ref 150–400)
RBC: 5.43 MIL/uL (ref 4.22–5.81)
RDW: 13.8 % (ref 11.5–15.5)
WBC: 3.9 10*3/uL — ABNORMAL LOW (ref 4.0–10.5)

## 2015-07-28 LAB — BRAIN NATRIURETIC PEPTIDE: B Natriuretic Peptide: 68.9 pg/mL (ref 0.0–100.0)

## 2015-07-28 LAB — I-STAT CG4 LACTIC ACID, ED: Lactic Acid, Venous: 1.1 mmol/L (ref 0.5–2.0)

## 2015-07-28 LAB — I-STAT TROPONIN, ED: Troponin i, poc: 0 ng/mL (ref 0.00–0.08)

## 2015-07-28 MED ORDER — DILTIAZEM HCL 100 MG IV SOLR
5.0000 mg/h | INTRAVENOUS | Status: DC
  Administered 2015-07-28: 5 mg/h via INTRAVENOUS
  Filled 2015-07-28: qty 100

## 2015-07-28 MED ORDER — AMITRIPTYLINE HCL 50 MG PO TABS
50.0000 mg | ORAL_TABLET | Freq: Every day | ORAL | Status: DC
  Administered 2015-07-29: 50 mg via ORAL
  Filled 2015-07-28: qty 1

## 2015-07-28 MED ORDER — PSYLLIUM 95 % PO PACK
1.0000 | PACK | Freq: Every day | ORAL | Status: DC
  Administered 2015-07-29: 1 via ORAL
  Filled 2015-07-28: qty 1

## 2015-07-28 MED ORDER — ACETAMINOPHEN 650 MG RE SUPP
650.0000 mg | Freq: Four times a day (QID) | RECTAL | Status: DC | PRN
Start: 2015-07-28 — End: 2015-07-29

## 2015-07-28 MED ORDER — POLYETHYLENE GLYCOL 3350 17 G PO PACK: 17.0000 g | PACK | Freq: Every day | ORAL | Status: DC | PRN

## 2015-07-28 MED ORDER — METOPROLOL SUCCINATE ER 50 MG PO TB24
50.0000 mg | ORAL_TABLET | Freq: Every day | ORAL | Status: DC
  Administered 2015-07-29: 50 mg via ORAL
  Filled 2015-07-28: qty 1

## 2015-07-28 MED ORDER — EZETIMIBE 10 MG PO TABS
10.0000 mg | ORAL_TABLET | Freq: Every day | ORAL | Status: DC
  Administered 2015-07-29: 10 mg via ORAL
  Filled 2015-07-28: qty 1

## 2015-07-28 MED ORDER — LEVOFLOXACIN IN D5W 750 MG/150ML IV SOLN
750.0000 mg | INTRAVENOUS | Status: DC
  Administered 2015-07-29: 750 mg via INTRAVENOUS
  Filled 2015-07-28: qty 150

## 2015-07-28 MED ORDER — HYDROCODONE-ACETAMINOPHEN 5-325 MG PO TABS: 1.0000 | ORAL_TABLET | ORAL | Status: DC | PRN

## 2015-07-28 MED ORDER — IPRATROPIUM-ALBUTEROL 0.5-2.5 (3) MG/3ML IN SOLN: 3.0000 mL | RESPIRATORY_TRACT | Status: DC | PRN

## 2015-07-28 MED ORDER — ONDANSETRON HCL 4 MG PO TABS
4.0000 mg | ORAL_TABLET | Freq: Four times a day (QID) | ORAL | Status: DC | PRN
Start: 2015-07-28 — End: 2015-07-29

## 2015-07-28 MED ORDER — TAMSULOSIN HCL 0.4 MG PO CAPS
0.4000 mg | ORAL_CAPSULE | Freq: Every day | ORAL | Status: DC
  Administered 2015-07-29: 0.4 mg via ORAL
  Filled 2015-07-28: qty 1

## 2015-07-28 MED ORDER — SODIUM CHLORIDE 0.9% FLUSH
3.0000 mL | Freq: Two times a day (BID) | INTRAVENOUS | Status: DC
  Administered 2015-07-29 (×2): 3 mL via INTRAVENOUS

## 2015-07-28 MED ORDER — DILTIAZEM LOAD VIA INFUSION
15.0000 mg | Freq: Once | INTRAVENOUS | Status: AC
  Administered 2015-07-28: 15 mg via INTRAVENOUS
  Filled 2015-07-28: qty 15

## 2015-07-28 MED ORDER — ACETAMINOPHEN 325 MG PO TABS: 650.0000 mg | ORAL_TABLET | Freq: Four times a day (QID) | ORAL | Status: DC | PRN

## 2015-07-28 MED ORDER — APIXABAN 2.5 MG PO TABS
2.5000 mg | ORAL_TABLET | Freq: Two times a day (BID) | ORAL | Status: DC
  Administered 2015-07-29 (×2): 2.5 mg via ORAL
  Filled 2015-07-28 (×2): qty 1

## 2015-07-28 MED ORDER — LISINOPRIL 10 MG PO TABS
10.0000 mg | ORAL_TABLET | Freq: Every day | ORAL | Status: DC
  Administered 2015-07-29: 10 mg via ORAL
  Filled 2015-07-28: qty 1

## 2015-07-28 MED ORDER — ONDANSETRON HCL 4 MG/2ML IJ SOLN: 4.0000 mg | Freq: Four times a day (QID) | INTRAMUSCULAR | Status: DC | PRN

## 2015-07-28 MED ORDER — IPRATROPIUM-ALBUTEROL 0.5-2.5 (3) MG/3ML IN SOLN
3.0000 mL | Freq: Once | RESPIRATORY_TRACT | Status: AC
  Administered 2015-07-28: 3 mL via RESPIRATORY_TRACT
  Filled 2015-07-28: qty 3

## 2015-07-28 MED ORDER — METOPROLOL TARTRATE 1 MG/ML IV SOLN
5.0000 mg | Freq: Once | INTRAVENOUS | Status: AC
Start: 2015-07-28 — End: 2015-07-28
  Administered 2015-07-28: 5 mg via INTRAVENOUS
  Filled 2015-07-28: qty 5

## 2015-07-28 NOTE — H&P (Signed)
[facility] History and Physical  [name] [id number] DOB: [birth year] DOA: 07/28/2015  Referring physician: ED physician PCP: Tammi Sou, MD  Specialists: Dr. Marin Olp (oncology), Dr. Angelena Form (cardiology)   Chief Complaint:  Cough  HPI: Richard Sullivan is a 82 y.o. male with PMH of atrial flutter on Eliquis, chronic kidney disease stage III, chronic leukopenia, significant but remote tobacco abuse, and chest x-ray findings suggestive of COPD who presents to the ED with 2 days of productive cough and malaise. Patient reports being in his usual state of fairly good health until approximately 2 days ago when he developed malaise with cough productive of thick yellow sputum. There has been some exertional dyspnea reported, but the patient denies fevers or chills. He denies sick contacts or recent long distance travel. There is no chest pain or unilateral leg swelling or redness. Patient went to an urgent care for these complaints, was noted to be in a rapid atrial arrhythmia, and directed to the hospital for further evaluation.  In ED, patient was found to be afebrile, saturating well on room air, with heart rate in the 120s, and stable blood pressure. EKG features and ectopic atrial tachycardia with left axis deviation. Chest x-ray is notable for hyperinflation and chronic interstitial coarsening. BMP features a serum creatinine of 2.06 which appears consistent with his baseline. CBC is notable for leukopenia to 3900, higher than prior measurements. Patient was given 5 mg IV push of Lopressor without appreciable effect. DuoNeb treatment was given with some minimal improvement in his respiratory symptoms. He was then given an IV bolus of diltiazem and started on diltiazem infusion. He remained intermittently tachycardic in the emergency department but was stable blood pressure. He will be admitted to the telemetry unit for ongoing evaluation and management of suspected chronic  bronchitis with acute exacerbation and atrial tachyarrhythmia.  Where does patient live?   At home     Can patient participate in ADLs?  Yes       Review of Systems:   General: no fevers, chills, sweats, weight change, poor appetite, or fatigue HEENT: no blurry vision, hearing changes or sore throat Pulm:  Cough productive of yellow sputum, dyspnea  CV: no chest pain or palpitations Abd: no nausea, vomiting, abdominal pain, diarrhea, or constipation GU: no dysuria, hematuria, increased urinary frequency, or urgency  Ext: no leg edema Neuro: no focal weakness, numbness, or tingling, no vision change or hearing loss Skin: no rash, no wounds MSK: No muscle spasm, no deformity, no red, hot, or swollen joint Heme: No easy bruising or bleeding Travel history: No recent long distant travel    Allergy: No Known Allergies  Past Medical History  Diagnosis Date  . Hyperlipidemia   . Chronic renal insufficiency, stage III (moderate)     CrCl@30  ml/min.  ?nephrotic syndrome in 93s?--no old records.  . Gout     Doing ok since off allopurinol 2015  . Atrial flutter ([place]) 10/2014    started on low dose eliquis by cardiologist  . Hypertension     takes meds daily  . BPH (benign prostatic hyperplasia)   . H/O exercise stress test     about 5 yrs. ago, saw cardiologist, had normal stress test, told NO need  to f/u /w cardiologist   . Depression   . History of anemia   . Colon polyps     Colonoscopy x 3 per pt--initial one with polyp but two 5 yr f/u colonoscopies normal per pt.  . Moderate  to severe aortic stenosis   . Chronic leukopenia     Neutropenia, monocytosis also with chronic thrombocytopenia--Dr. Marin Olp following.  . Microhematuria     Renal u/s 08/2013 remarkable only for small nonobstructing stone in each kidney.  Urol feels like this is likely the source of the microhem, cystoscopy likely low yield--following stones with q41mo ultrasounds and office f/u ([facility])  . Subclinical  hypothyroidism 06/2014    TSH 5.2, normal T4 and T3.    Past Surgical History  Procedure Laterality Date  . Prostate surgery      TURP 1980s/90s.--HELPED.  [facility] Kitchen Eye surgery      cataract - right  . Colonoscopy    . Inguinal hernia repair  12/27/2011    Procedure: HERNIA REPAIR INGUINAL ADULT;  Surgeon: Odis Hollingshead, MD;  Location: [facility];  Service: General;  Laterality: Right;  . Tonsillectomy    . Transthoracic echocardiogram  07/22/14    EF 55-60%, AS worse than prior echo: mean gradient 44, peak >100: f/u 3 mo per cardiologist    Social History:  reports that he quit smoking about 32 years ago. His smoking use included Cigarettes. He has a 32 pack-year smoking history. He does not have any smokeless tobacco history on file. He reports that he does not drink alcohol or use illicit drugs.  Family History:  Family History  Problem Relation Age of Onset  . Arthritis Mother   . Hypertension Father      Prior to Admission medications   Medication Sig Start Date End Date Taking? Authorizing Provider  amitriptyline (ELAVIL) 50 MG tablet Take 1 tablet (50 mg total) by mouth at bedtime. 05/11/15  Yes Tammi Sou, MD  apixaban (ELIQUIS) 2.5 MG TABS tablet Take 1 tablet (2.5 mg total) by mouth 2 (two) times daily. 10/23/14  Yes [name], MD  ezetimibe (ZETIA) 10 MG tablet Take 1 tablet (10 mg total) by mouth daily. 05/11/15  Yes Tammi Sou, MD  lisinopril (PRINIVIL,ZESTRIL) 10 MG tablet Take 1 tablet (10 mg total) by mouth daily. 03/24/15  Yes Tammi Sou, MD  metoprolol succinate (TOPROL-XL) 50 MG 24 hr tablet Take 1 tablet (50 mg total) by mouth daily. 05/11/15  Yes Tammi Sou, MD  psyllium (METAMUCIL) 58.6 % packet Take 1 packet by mouth daily.   Yes Historical Provider, MD  tamsulosin (FLOMAX) 0.4 MG CAPS capsule Take 1 capsule (0.4 mg total) by mouth daily. 05/11/15  Yes Tammi Sou, MD    Physical Exam: Filed Vitals:   07/28/15 2317 07/28/15 2325  07/28/15 2330 07/28/15 2333  BP:   142/62   Pulse: 108 81 81 82  Temp:      TempSrc:      Resp: 15 25 21 19   Height:      Weight:      SpO2: 98% 99% 98% 99%   General: Not in acute distress HEENT:       Eyes: PERRL, EOMI, no scleral icterus or conjunctival pallor.       ENT: No discharge from the ears or nose, no pharyngeal ulcers         Neck: No JVD, no bruit, no appreciable mass Heme: No cervical adenopathy, no pallor Cardiac: S1/S2, RRR, soft systolic murmur at apex, No gallops or rubs. Pulm: mildly diminished bilaterally. Occasional expiratory wheeze. Abd: Soft, nondistended, nontender, no rebound pain or gaurding, BS present. Ext: No LE edema bilaterally. 2+DP/PT pulse bilaterally. Musculoskeletal: No gross deformity, no red,  hot, swollen joints Skin: No rashes or wounds on exposed surfaces  Neuro: Alert, oriented X3, cranial nerves II-XII grossly intact. No focal findings Psych: Patient is not overtly psychotic, appropriate mood and affect.  Labs on Admission:  Basic Metabolic Panel:  Recent Labs Lab 07/28/15 2048  NA 138  K 4.3  CL 109  CO2 21*  GLUCOSE 87  BUN 31*  CREATININE 2.06*  CALCIUM 9.5   Liver Function Tests: No results for input(s): AST, ALT, ALKPHOS, BILITOT, PROT, ALBUMIN in the last 168 hours. No results for input(s): LIPASE, AMYLASE in the last 168 hours. No results for input(s): AMMONIA in the last 168 hours. CBC:  Recent Labs Lab 07/28/15 2048  WBC 3.9*  HGB 15.8  HCT 45.1  MCV 83.1  PLT 174   Cardiac Enzymes: No results for input(s): CKTOTAL, CKMB, CKMBINDEX, TROPONINI in the last 168 hours.  BNP (last 3 results)  Recent Labs  07/28/15 2223  BNP 68.9    ProBNP (last 3 results) No results for input(s): PROBNP in the last 8760 hours.  CBG: No results for input(s): GLUCAP in the last 168 hours.  Radiological Exams on Admission: Dg Chest 2 View  07/28/2015  CLINICAL DATA:  Productive cough for 3 or 4 days. EXAM: CHEST  2  VIEW COMPARISON:  12/20/2011 FINDINGS: There is mild unchanged hyperinflation and borderline heart size. There is chronic appearing interstitial coarsening. No confluent alveolar opacity. No effusions. Normal pulmonary vasculature. Hilar and mediastinal contours are unremarkable and unchanged. There is a hiatal hernia. IMPRESSION: Hyperinflation and chronic appearing interstitial coarsening. Hiatal hernia. Electronically Signed   By: Andreas Newport M.D.   On: 07/28/2015 21:18    EKG: Independently reviewed.  Abnormal findings:  Ectopic atrial tachycardia (rate 111), LAD   Assessment/Plan  1. Chronic bronchitis with acute exacerbation  - CXR c/w COPD, but no infiltrate identified - Continue DuoNeb q4h prn SOB or wheezing  - Levaquin, renally-dosed to 750 mg q48  - Titrate FiO2 to maintain sat >92%   2. Atrial tachyarrhythmia with rapid rate  - Pt has hx of atrial flutter on Eliquis  - CHADS-VASc is 3 for age (x2), HTN  - Rate did not come down with metoprolol IVP, but responding well to diltiazem infusion - Monitor on telemetry, wean dilt infusion as tolerated  - Continue home-dose metoprolol BID    3. CKD stage III  - SCr 2.06 on admission, consistent with his apparent baseline  - Avoid nephrotoxic agents where possible  - Renally-dose medications    4. Hypertension  - Modestly elevated currently  - Hesitant to treat right now in anticipation of BP coming down with diltiazem infusion  - Monitor for now, add a prn agent for any persistent and significant elevations   5. Leukopenia  - WBC is 3,900 on admission, stable relative to priors  - This is a chronic problem for which he follows with oncology  - Monitor    DVT ppx: Continue Eliquis  Code Status: Full code Family Communication: None at bed side.                Disposition Plan: Admit to inpatient   Date of Service 07/28/2015    Vianne Bulls, MD [facility] Pager [phone]  If 7PM-7AM, please contact  night-coverage [email or web address] Password [facility] 07/28/2015, 11:37 PM

## 2015-07-28 NOTE — ED Notes (Signed)
MD at bedside. 

## 2015-07-28 NOTE — ED Notes (Signed)
Pt was sent here from an Urgent care for productive cough x 2 days with greyish sputum. Pt denies chest pain nor pain anywhere. Pt has a history of a fib and is in a fib at time of assessment. Pt denies congestion, dizziness, nausea, of vomiting.

## 2015-07-28 NOTE — ED Provider Notes (Signed)
CSN: [id number]     Arrival date & time 07/28/15  2021 History   First MD Initiated Contact with Patient 07/28/15 2055     Chief Complaint  Patient presents with  . Cough  . Tachycardia     (Consider location/radiation/quality/duration/timing/severity/associated sxs/prior Treatment) HPI Patient has had cough for 3 days. He reports as been productive of a grayish green sputum. Denies chest pain, fever or shortness of breath. He reports he went to the urgent care and they sent him to the emergency department for high heart rate. He reports his wife is currently hospitalized for a GI bleed. He reports he has had his influenza shot. Patient reports that he took all of his morning medications already. Past Medical History  Diagnosis Date  . Hyperlipidemia   . Chronic renal insufficiency, stage III (moderate)     CrCl@30  ml/min.  ?nephrotic syndrome in 74s?--no old records.  . Gout     Doing ok since off allopurinol 2015  . Atrial flutter ([place]) 10/2014    started on low dose eliquis by cardiologist  . Hypertension     takes meds daily  . BPH (benign prostatic hyperplasia)   . H/O exercise stress test     about 5 yrs. ago, saw cardiologist, had normal stress test, told NO need  to f/u /w cardiologist   . Depression   . History of anemia   . Colon polyps     Colonoscopy x 3 per pt--initial one with polyp but two 5 yr f/u colonoscopies normal per pt.  . Moderate to severe aortic stenosis   . Chronic leukopenia     Neutropenia, monocytosis also with chronic thrombocytopenia--Dr. Marin Olp following.  . Microhematuria     Renal u/s 08/2013 remarkable only for small nonobstructing stone in each kidney.  Urol feels like this is likely the source of the microhem, cystoscopy likely low yield--following stones with q40mo ultrasounds and office f/u ([facility])  . Subclinical hypothyroidism 06/2014    TSH 5.2, normal T4 and T3.   Past Surgical History  Procedure Laterality Date  . Prostate surgery       TURP 1980s/90s.--HELPED.  Marland Kitchen Eye surgery      cataract - right  . Colonoscopy    . Inguinal hernia repair  12/27/2011    Procedure: HERNIA REPAIR INGUINAL ADULT;  Surgeon: Odis Hollingshead, MD;  Location: [facility];  Service: General;  Laterality: Right;  . Tonsillectomy    . Transthoracic echocardiogram  07/22/14    EF 55-60%, AS worse than prior echo: mean gradient 44, peak >100: f/u 3 mo per cardiologist   Family History  Problem Relation Age of Onset  . Arthritis Mother   . Hypertension Father    Social History  Substance Use Topics  . Smoking status: Former Smoker -- 1.00 packs/day for 32 years    Types: Cigarettes    Quit date: 12/05/1982  . Smokeless tobacco: None  . Alcohol Use: No    Review of Systems  10 Systems reviewed and are negative for acute change except as noted in the HPI.   Allergies  Review of patient's allergies indicates no known allergies.  Home Medications   Prior to Admission medications   Medication Sig Start Date End Date Taking? Authorizing Provider  amitriptyline (ELAVIL) 50 MG tablet Take 1 tablet (50 mg total) by mouth at bedtime. 05/11/15  Yes Tammi Sou, MD  apixaban (ELIQUIS) 2.5 MG TABS tablet Take 1 tablet (2.5 mg total) by mouth 2 (  two) times daily. 10/23/14  Yes [name], MD  ezetimibe (ZETIA) 10 MG tablet Take 1 tablet (10 mg total) by mouth daily. 05/11/15  Yes Tammi Sou, MD  lisinopril (PRINIVIL,ZESTRIL) 10 MG tablet Take 1 tablet (10 mg total) by mouth daily. 03/24/15  Yes Tammi Sou, MD  metoprolol succinate (TOPROL-XL) 50 MG 24 hr tablet Take 1 tablet (50 mg total) by mouth daily. 05/11/15  Yes Tammi Sou, MD  psyllium (METAMUCIL) 58.6 % packet Take 1 packet by mouth daily.   Yes Historical Provider, MD  tamsulosin (FLOMAX) 0.4 MG CAPS capsule Take 1 capsule (0.4 mg total) by mouth daily. 05/11/15  Yes Tammi Sou, MD   BP 151/78 mmHg  Pulse 81  Temp(Src) 99.2 F (37.3 C) (Oral)  Resp 25   Ht 6' (1.829 m)  Wt 175 lb (79.379 kg)  BMI 23.73 kg/m2  SpO2 99% Physical Exam  Constitutional: He is oriented to person, place, and time. He appears well-developed and well-nourished.  HENT:  Head: Normocephalic and atraumatic.  Eyes: EOM are normal. Pupils are equal, round, and reactive to light.  Neck: Neck supple.  Cardiovascular: Intact distal pulses.   Tachycardia, irregularly irregular, 3/6 systolic ejection murmur.  Pulmonary/Chest: Effort normal.  Cough paroxysmal with deep inspiration. Occasional expiratory wheeze.  Abdominal: Soft. Bowel sounds are normal. He exhibits no distension. There is no tenderness.  Musculoskeletal: Normal range of motion. He exhibits no edema or tenderness.  Neurological: He is alert and oriented to person, place, and time. He has normal strength. Coordination normal. GCS eye subscore is 4. GCS verbal subscore is 5. GCS motor subscore is 6.  Skin: Skin is warm, dry and intact.  Psychiatric: He has a normal mood and affect.    ED Course  Procedures (including critical care time) CRITICAL CARE Performed by: Charlesetta Shanks   Total critical care time: 30 minutes  Critical care time was exclusive of separately billable procedures and treating other patients.  Critical care was necessary to treat or prevent imminent or life-threatening deterioration.  Critical care was time spent personally by me on the following activities: development of treatment plan with patient and/or surrogate as well as nursing, discussions with consultants, evaluation of patient's response to treatment, examination of patient, obtaining history from patient or surrogate, ordering and performing treatments and interventions, ordering and review of laboratory studies, ordering and review of radiographic studies, pulse oximetry and re-evaluation of patient's condition. Labs Review Labs Reviewed  BASIC METABOLIC PANEL - Abnormal; Notable for the following:    CO2 21 (*)     BUN 31 (*)    Creatinine, Ser 2.06 (*)    GFR calc non Af Amer 28 (*)    GFR calc Af Amer 33 (*)    All other components within normal limits  CBC - Abnormal; Notable for the following:    WBC 3.9 (*)    All other components within normal limits  CULTURE, BLOOD (ROUTINE X 2)  CULTURE, BLOOD (ROUTINE X 2)  URINE CULTURE  BRAIN NATRIURETIC PEPTIDE  URINALYSIS, ROUTINE W REFLEX MICROSCOPIC (NOT AT [facility])  I-STAT CG4 LACTIC ACID, ED  I-STAT TROPOININ, ED  I-STAT CG4 LACTIC ACID, ED    Imaging Review Dg Chest 2 View  07/28/2015  CLINICAL DATA:  Productive cough for 3 or 4 days. EXAM: CHEST  2 VIEW COMPARISON:  12/20/2011 FINDINGS: There is mild unchanged hyperinflation and borderline heart size. There is chronic appearing interstitial coarsening. No confluent alveolar  opacity. No effusions. Normal pulmonary vasculature. Hilar and mediastinal contours are unremarkable and unchanged. There is a hiatal hernia. IMPRESSION: Hyperinflation and chronic appearing interstitial coarsening. Hiatal hernia. Electronically Signed   By: Andreas Newport M.D.   On: 07/28/2015 21:18   I have personally reviewed and evaluated these images and lab results as part of my medical decision-making.   EKG Interpretation   Date/Time:  Tuesday July 28 2015 20:32:23 EDT Ventricular Rate:  111 PR Interval:  178 QRS Duration: 78 QT Interval:  308 QTC Calculation: 418 R Axis:   -79 Text Interpretation:  Ectopic atrial tachycardia, unifocal Ventricular  trigeminy Left axis deviation Consider right ventricular hypertrophy Repol  abnrm, severe global ischemia (LM/MVD) agree. no apparent STEMI. Ischemic  changes rate related. Confirmed by Johnney Killian, MD, Jeannie Done [id number]) on  07/28/2015 9:10:04 PM      MDM   Final diagnoses:  Atrial fibrillation with rapid ventricular response ([place])  Bronchitis   Patient presents with H of fibrillation with rapid ventricular response. This is likely been exacerbated by bronchitis.  Patient reports productive cough but not fever or chest pain. Chest x-ray does not show focal consolidation. Patient has cough paroxysmal with inspiration. He has been treated with DuoNeb. Also Cardizem bolus and drip and given for atrial fibrillation RVR.    Charlesetta Shanks, MD 07/28/15 2330

## 2015-07-29 ENCOUNTER — Telehealth: Payer: Self-pay | Admitting: *Deleted

## 2015-07-29 DIAGNOSIS — I4891 Unspecified atrial fibrillation: Secondary | ICD-10-CM | POA: Insufficient documentation

## 2015-07-29 DIAGNOSIS — J42 Unspecified chronic bronchitis: Secondary | ICD-10-CM | POA: Diagnosis not present

## 2015-07-29 DIAGNOSIS — J209 Acute bronchitis, unspecified: Secondary | ICD-10-CM | POA: Diagnosis not present

## 2015-07-29 LAB — BASIC METABOLIC PANEL
Anion gap: 9 (ref 5–15)
BUN: 29 mg/dL — ABNORMAL HIGH (ref 6–20)
CO2: 19 mmol/L — ABNORMAL LOW (ref 22–32)
Calcium: 9.4 mg/dL (ref 8.9–10.3)
Chloride: 108 mmol/L (ref 101–111)
Creatinine, Ser: 1.84 mg/dL — ABNORMAL HIGH (ref 0.61–1.24)
GFR calc Af Amer: 38 mL/min — ABNORMAL LOW (ref >60)
GFR calc non Af Amer: 32 mL/min — ABNORMAL LOW (ref >60)
Glucose, Bld: 95 mg/dL (ref 65–99)
Potassium: 4.1 mmol/L (ref 3.5–5.1)
Sodium: 136 mmol/L (ref 135–145)

## 2015-07-29 LAB — MAGNESIUM: Magnesium: 1.7 mg/dL (ref 1.7–2.4)

## 2015-07-29 LAB — PROCALCITONIN: Procalcitonin: <0.1 ng/mL

## 2015-07-29 LAB — TSH: TSH: 2.587 u[IU]/mL (ref 0.350–4.500)

## 2015-07-29 LAB — T4, FREE: Free T4: 1.35 ng/dL — ABNORMAL HIGH (ref 0.61–1.12)

## 2015-07-29 MED ORDER — BENZONATATE 100 MG PO CAPS: 100.0000 mg | ORAL_CAPSULE | Freq: Three times a day (TID) | ORAL | Status: DC | PRN

## 2015-07-29 MED ORDER — AMOXICILLIN-POT CLAVULANATE 875-125 MG PO TABS
1.0000 | ORAL_TABLET | Freq: Two times a day (BID) | ORAL | Status: DC
  Administered 2015-07-29: 1 via ORAL
  Filled 2015-07-29: qty 1

## 2015-07-29 MED ORDER — AMOXICILLIN-POT CLAVULANATE 875-125 MG PO TABS: 1.0000 | ORAL_TABLET | Freq: Two times a day (BID) | ORAL | Status: DC

## 2015-07-29 NOTE — Telephone Encounter (Signed)
Tried calling pt for TCM phone follow up. NA, left message for pt to call back.

## 2015-07-29 NOTE — Discharge Summary (Signed)
Physician Discharge Summary  Richard Sullivan [id number] DOB: 05/08/1930 DOA: 07/28/2015  PCP: Tammi Sou, MD  Admit date: 07/28/2015 Discharge date: 07/29/2015  Time spent: 35 minutes  Recommendations for Outpatient Follow-up:  1. Please follow-up on respiratory symptoms, he was admitted for acute bronchitis discharged on Augmentin   Discharge Diagnoses:  Principal Problem:   Chronic bronchitis with acute exacerbation ([place]) Active Problems:   HTN (hypertension), benign   Chronic kidney disease (CKD), stage III (moderate)   Chronic leukopenia   Tachyarrhythmia   Atrial fibrillation with rapid ventricular response [facility])   Discharge Condition: Stable  Diet recommendation: Heart healthy  Filed Weights   07/28/15 2030 07/29/15 0037  Weight: 79.379 kg (175 lb) 77.8 kg (171 lb 8.3 oz)    History of present illness:  Richard Sullivan is a 82 y.o. male with PMH of atrial flutter on Eliquis, chronic kidney disease stage III, chronic leukopenia, significant but remote tobacco abuse, and chest x-ray findings suggestive of COPD who presents to the ED with 2 days of productive cough and malaise. Patient reports being in his usual state of fairly good health until approximately 2 days ago when he developed malaise with cough productive of thick yellow sputum. There has been some exertional dyspnea reported, but the patient denies fevers or chills. He denies sick contacts or recent long distance travel. There is no chest pain or unilateral leg swelling or redness. Patient went to an urgent care for these complaints, was noted to be in a rapid atrial arrhythmia, and directed to the hospital for further evaluation.  In ED, patient was found to be afebrile, saturating well on room air, with heart rate in the 120s, and stable blood pressure. EKG features and ectopic atrial tachycardia with left axis deviation. Chest x-ray is notable for hyperinflation and chronic interstitial coarsening.  BMP features a serum creatinine of 2.06 which appears consistent with his baseline. CBC is notable for leukopenia to 3900, higher than prior measurements. Patient was given 5 mg IV push of Lopressor without appreciable effect. DuoNeb treatment was given with some minimal improvement in his respiratory symptoms. He was then given an IV bolus of diltiazem and started on diltiazem infusion. He remained intermittently tachycardic in the emergency department but was stable blood pressure. He will be admitted to the telemetry unit for ongoing evaluation and management of suspected chronic bronchitis with acute exacerbation and atrial tachyarrhythmia.  Hospital Course:  Richard Sullivan is an 82 year old gentleman with a history of tobacco abuse, atrial flutter on Eliquis, admitted to the medicine service on 07/28/2015 when he presented with complaints of productive cough associate with thick yellow sputum production. Initial workup included a chest x-ray revealed hyperinflation and chronic appearing interstitial coarsening. Symptoms were felt to be secondary to acute bacterial bronchitis as he was started on empiric IV antimicrobial therapy with Levaquin. He was also found to be in A. fib with RVR and initially started on IV Cardizem. Heart rates improved by the following morning as he was transitioned to metoprolol. Suspect acute pulmonary process precipitating A. fib with RVR. Blood cultures remain negative after overnight incubation. By the following morning he reported feeling significantly better and wanted to go home. He was worried about his wife he is currently hospitalized. He remained afebrile, hemodynamically stable. On day of discharge is annular down the hallway maintaining oxygen saturations in the mid 90s. Was discharged on 07/29/2015 in stable condition.   Discharge Exam: Filed Vitals:   07/29/15 0423 07/29/15 1315  BP:  138/60 120/58  Pulse: 108 98  Temp: 98.5 F (36.9 C) 98.2 F (36.8 C)   Resp: 18 18    General: Nontoxic appearing, awake and alert Cardiovascular: Irregular rate and rhythm normal S1-S2 no murmurs rubs gallops Respiratory: Normal respiratory effort, having a few scattered tori wheezes otherwise lungs clear to auscultation bilaterally. Abdomen: Soft nontender nondistended Extremities: No edema  Discharge Instructions   Discharge Instructions    Call MD for:  difficulty breathing, headache or visual disturbances    Complete by:  As directed      Call MD for:  difficulty breathing, headache or visual disturbances    Complete by:  As directed      Call MD for:  extreme fatigue    Complete by:  As directed      Call MD for:  extreme fatigue    Complete by:  As directed      Call MD for:  hives    Complete by:  As directed      Call MD for:  hives    Complete by:  As directed      Call MD for:  persistant dizziness or light-headedness    Complete by:  As directed      Call MD for:  persistant dizziness or light-headedness    Complete by:  As directed      Call MD for:  persistant nausea and vomiting    Complete by:  As directed      Call MD for:  persistant nausea and vomiting    Complete by:  As directed      Call MD for:  redness, tenderness, or signs of infection (pain, swelling, redness, odor or green/yellow discharge around incision site)    Complete by:  As directed      Call MD for:  redness, tenderness, or signs of infection (pain, swelling, redness, odor or green/yellow discharge around incision site)    Complete by:  As directed      Call MD for:  severe uncontrolled pain    Complete by:  As directed      Call MD for:  severe uncontrolled pain    Complete by:  As directed      Call MD for:  temperature >100.4    Complete by:  As directed      Call MD for:  temperature >100.4    Complete by:  As directed      Call MD for:    Complete by:  As directed      Call MD for:    Complete by:  As directed      Diet - low sodium heart healthy     Complete by:  As directed      Diet - low sodium heart healthy    Complete by:  As directed      Increase activity slowly    Complete by:  As directed      Increase activity slowly    Complete by:  As directed           Current Discharge Medication List    START taking these medications   Details  amoxicillin-clavulanate (AUGMENTIN) 875-125 MG tablet Take 1 tablet by mouth every 12 (twelve) hours. Qty: 10 tablet, Refills: 0    benzonatate (TESSALON) 100 MG capsule Take 1 capsule (100 mg total) by mouth 3 (three) times daily as needed for cough. Qty: 20 capsule, Refills: 0      CONTINUE these  medications which have NOT CHANGED   Details  amitriptyline (ELAVIL) 50 MG tablet Take 1 tablet (50 mg total) by mouth at bedtime. Qty: 90 tablet, Refills: 1    apixaban (ELIQUIS) 2.5 MG TABS tablet Take 1 tablet (2.5 mg total) by mouth 2 (two) times daily. Qty: 60 tablet, Refills: 6   Associated Diagnoses: Typical atrial flutter (HCC)    ezetimibe (ZETIA) 10 MG tablet Take 1 tablet (10 mg total) by mouth daily. Qty: 90 tablet, Refills: 3    lisinopril (PRINIVIL,ZESTRIL) 10 MG tablet Take 1 tablet (10 mg total) by mouth daily. Qty: 30 tablet, Refills: 6    metoprolol succinate (TOPROL-XL) 50 MG 24 hr tablet Take 1 tablet (50 mg total) by mouth daily. Qty: 90 tablet, Refills: 3    psyllium (METAMUCIL) 58.6 % packet Take 1 packet by mouth daily.    tamsulosin (FLOMAX) 0.4 MG CAPS capsule Take 1 capsule (0.4 mg total) by mouth daily. Qty: 90 capsule, Refills: 1       No Known Allergies Follow-up Information    Follow up with MCGOWEN,PHILIP H, MD In 1 week.   Specialty:  Family Medicine   Contact information:   [id number] [place] [address] [phone]        The results of significant diagnostics from this hospitalization (including imaging, microbiology, ancillary and laboratory) are listed below for reference.    Significant Diagnostic Studies: Dg  Chest 2 View  07/28/2015  CLINICAL DATA:  Productive cough for 3 or 4 days. EXAM: CHEST  2 VIEW COMPARISON:  12/20/2011 FINDINGS: There is mild unchanged hyperinflation and borderline heart size. There is chronic appearing interstitial coarsening. No confluent alveolar opacity. No effusions. Normal pulmonary vasculature. Hilar and mediastinal contours are unremarkable and unchanged. There is a hiatal hernia. IMPRESSION: Hyperinflation and chronic appearing interstitial coarsening. Hiatal hernia. Electronically Signed   By: Andreas Newport M.D.   On: 07/28/2015 21:18    Microbiology: No results found for this or any previous visit (from the past 240 hour(s)).   Labs: Basic Metabolic Panel:  Recent Labs Lab 07/28/15 2048 07/29/15 0509  NA 138 136  K 4.3 4.1  CL 109 108  CO2 21* 19*  GLUCOSE 87 95  BUN 31* 29*  CREATININE 2.06* 1.84*  CALCIUM 9.5 9.4  MG  --  1.7   Liver Function Tests: No results for input(s): AST, ALT, ALKPHOS, BILITOT, PROT, ALBUMIN in the last 168 hours. No results for input(s): LIPASE, AMYLASE in the last 168 hours. No results for input(s): AMMONIA in the last 168 hours. CBC:  Recent Labs Lab 07/28/15 2048  WBC 3.9*  HGB 15.8  HCT 45.1  MCV 83.1  PLT 174   Cardiac Enzymes: No results for input(s): CKTOTAL, CKMB, CKMBINDEX, TROPONINI in the last 168 hours. BNP: BNP (last 3 results)  Recent Labs  07/28/15 2223  BNP 68.9    ProBNP (last 3 results) No results for input(s): PROBNP in the last 8760 hours.  CBG: No results for input(s): GLUCAP in the last 168 hours.     Signed:  Kelvin Cellar MD.  [facility] 07/29/2015, 2:29 PM

## 2015-07-30 NOTE — Telephone Encounter (Signed)
Tried calling pt to TCM phone follow up. NA, left message for pt to call back.

## 2015-08-03 LAB — CULTURE, BLOOD (ROUTINE X 2)
Culture: NO GROWTH
Culture: NO GROWTH

## 2015-08-05 ENCOUNTER — Encounter: Payer: Self-pay | Admitting: Family Medicine

## 2015-08-05 ENCOUNTER — Ambulatory Visit (INDEPENDENT_AMBULATORY_CARE_PROVIDER_SITE_OTHER): Payer: Medicare Other | Admitting: Family Medicine

## 2015-08-05 VITALS — BP 143/79 | HR 82 | Temp 97.5°F | Resp 16 | Ht 72.0 in | Wt 164.5 lb

## 2015-08-05 DIAGNOSIS — J209 Acute bronchitis, unspecified: Secondary | ICD-10-CM

## 2015-08-05 DIAGNOSIS — I4891 Unspecified atrial fibrillation: Secondary | ICD-10-CM | POA: Diagnosis not present

## 2015-08-05 DIAGNOSIS — J42 Unspecified chronic bronchitis: Secondary | ICD-10-CM | POA: Diagnosis not present

## 2015-08-05 NOTE — Progress Notes (Signed)
Pre visit review using our clinic review tool, if applicable. No additional management support is needed unless otherwise documented below in the visit note. 

## 2015-08-05 NOTE — Progress Notes (Signed)
08/05/2015  CC:  Chief Complaint  Patient presents with  . TCM Hospital Follow Up    Pt is fasting.     Patient is a 82 y.o. Caucasian male who presents for  hospital follow up, specifically [facility] Services face-to-face visit. Dates hospitalized: 4/11-4/12, 2017. Days since d/c from hospital: 7 Patient was discharged from hospital to home. Reason for admission to hospital:  Chronic bronchitis with acute exacerbation that was complicated by exacerbation of his atrial arrhythmia. Date of interactive (phone) contact with patient and/or caregiver:07/29/15  I have reviewed patient's discharge summary plus pertinent specific notes, labs, and imaging from the hospitalization.    Feels like 75% of the cough is gone.  Says bp at home is normal.  Denies palpitations or heart racing.  Denies SOB. Denies pain anywhere. Also feeling down emotionally b/c his wife is in hospital for an ulcer in colon. Appetite is gone due to all the worry he is going through.  Medication reconciliation was done today and patient has taken meds as recommended by discharging hospitalist/specialist.  He took a 5 d course of augmentin after discharge.  Was on abx while in hosp.  PMH:  Past Medical History  Diagnosis Date  . Hyperlipidemia   . Chronic renal insufficiency, stage III (moderate)     CrCl@30  ml/min.  ?nephrotic syndrome in 13s?--no old records.  . Gout     Doing ok since off allopurinol 2015  . Atrial flutter ([place]) 10/2014    started on low dose eliquis by cardiologist  . Hypertension     takes meds daily  . BPH (benign prostatic hyperplasia)   . H/O exercise stress test     about 5 yrs. ago, saw cardiologist, had normal stress test, told NO need  to f/u /w cardiologist   . Depression   . History of anemia   . Colon polyps     Colonoscopy x 3 per pt--initial one with polyp but two 5 yr f/u colonoscopies normal per pt.  . Moderate to severe aortic stenosis   . Chronic leukopenia      Neutropenia, monocytosis also with chronic thrombocytopenia--Dr. Marin Olp following.  . Microhematuria     Renal u/s 08/2013 remarkable only for small nonobstructing stone in each kidney.  Urol feels like this is likely the source of the microhem, cystoscopy likely low yield--following stones with q74mo ultrasounds and office f/u ([facility])  . Subclinical hypothyroidism 06/2014    TSH 5.2, normal T4 and T3.    PSH:  Past Surgical History  Procedure Laterality Date  . Prostate surgery      TURP 1980s/90s.--HELPED.  [facility] Eye surgery      cataract - right  . Colonoscopy    . Inguinal hernia repair  12/27/2011    Procedure: HERNIA REPAIR INGUINAL ADULT;  Surgeon: Odis Hollingshead, MD;  Location: [facility];  Service: General;  Laterality: Right;  . Tonsillectomy    . Transthoracic echocardiogram  07/22/14    EF 55-60%, AS worse than prior echo: mean gradient 44, peak >100: f/u 3 mo per cardiologist    MEDS:  Outpatient Prescriptions Prior to Visit  Medication Sig Dispense Refill  . amitriptyline (ELAVIL) 50 MG tablet Take 1 tablet (50 mg total) by mouth at bedtime. 90 tablet 1  . amoxicillin-clavulanate (AUGMENTIN) 875-125 MG tablet Take 1 tablet by mouth every 12 (twelve) hours. 10 tablet 0  . apixaban (ELIQUIS) 2.5 MG TABS tablet Take 1 tablet (2.5 mg total) by mouth 2 (two)  times daily. 60 tablet 6  . benzonatate (TESSALON) 100 MG capsule Take 1 capsule (100 mg total) by mouth 3 (three) times daily as needed for cough. 20 capsule 0  . ezetimibe (ZETIA) 10 MG tablet Take 1 tablet (10 mg total) by mouth daily. 90 tablet 3  . lisinopril (PRINIVIL,ZESTRIL) 10 MG tablet Take 1 tablet (10 mg total) by mouth daily. 30 tablet 6  . metoprolol succinate (TOPROL-XL) 50 MG 24 hr tablet Take 1 tablet (50 mg total) by mouth daily. 90 tablet 3  . psyllium (METAMUCIL) 58.6 % packet Take 1 packet by mouth daily.    . tamsulosin (FLOMAX) 0.4 MG CAPS capsule Take 1 capsule (0.4 mg total) by mouth daily. 90 capsule 1    No facility-administered medications prior to visit.   EXAM: BP 143/79 mmHg  Pulse 82  Temp(Src) 97.5 F (36.4 C) (Oral)  Resp 16  Ht 6' (1.829 m)  Wt 164 lb 8 oz (74.617 kg)  BMI 22.31 kg/m2  SpO2 100%  Gen: Alert, well appearing.  Patient is oriented to person, place, time, and situation. VH:[id number]: no injection, icteris, swelling, or exudate.  EOMI, PERRLA. Mouth: lips without lesion/swelling.  Oral mucosa pink and moist. Oropharynx without erythema, exudate, or swelling.  CV: RRR with frequent ectopy, with 2/6 systolic murmur, no r/g.   LUNGS: CTA bilat, nonlabored resps, good aeration in all lung fields. EXT: no clubbing, cyanosis, or edema.    Pertinent labs/imaging CXR 07/28/15: IMPRESSION--Hyperinflation and chronic appearing interstitial coarsening. Hiatal Hernia.  Lab Results  Component Value Date   WBC 3.9* 07/28/2015   HGB 15.8 07/28/2015   HCT 45.1 07/28/2015   MCV 83.1 07/28/2015   PLT 174 07/28/2015     Chemistry      Component Value Date/Time   NA 136 07/29/2015 0509   K 4.1 07/29/2015 0509   CL 108 07/29/2015 0509   CO2 19* 07/29/2015 0509   BUN 29* 07/29/2015 0509   CREATININE 1.84* 07/29/2015 0509      Component Value Date/Time   CALCIUM 9.4 07/29/2015 0509   ALKPHOS 76 06/20/2014 0831   AST 21 06/20/2014 0831   ALT 19 06/20/2014 0831   BILITOT 1.2 06/20/2014 0831     Lab Results  Component Value Date   TSH 2.587 07/28/2015    ASSESSMENT/PLAN:  Transitional care management, hospital f/u:  1) Chronic bronchitis with acute exacerbation--bacterial etiology suspected. Much improved/appropriate improvement.  Told pt to expect mild cough/gradual return to baseline over the next 2 wks. No new meds at this time.  2) A-fib w/RVR in hosp: exacerbation of this was caused by his acute respiratory condition. Resolved appropriately and doing well on rate control and eliquis. Pt plans on contacting his cardiologist's office for a f/u visit  today.  Medical decision making of moderate complexity was utilized today.  An After Visit Summary was printed and given to the patient.  FOLLOW UP:  Keep f/u appt already set for 09/18/15.  I'll recheck his urine microalb/cr at that time.  Signed:  Crissie Sickles, MD           08/05/2015

## 2015-08-15 ENCOUNTER — Other Ambulatory Visit: Payer: Self-pay | Admitting: Cardiovascular Disease

## 2015-08-25 ENCOUNTER — Telehealth: Payer: Self-pay | Admitting: *Deleted

## 2015-08-25 NOTE — Telephone Encounter (Signed)
Received message from scheduling that pt had cancelled echo scheduled for 08/26/15 and would call back to reschedule. Pt also due for follow up.  I placed call to pt to schedule these appointments.  Left message to call back

## 2015-08-26 ENCOUNTER — Ambulatory Visit (HOSPITAL_COMMUNITY): Payer: Medicare Other

## 2015-09-03 NOTE — Telephone Encounter (Signed)
Spoke with Richard Sullivan. His wife has been ill and he has been caring for her.  He thinks he will be able to come in for appt in June.  Appt made for Richard Sullivan to see Dr. Angelena Form on June 23,2017 at 10:30.  He will call our office to reschedule echo prior to this appt.

## 2015-09-03 NOTE — Telephone Encounter (Signed)
Left message on pt's cell phone to call back.  Son answered home number listed for pt. I asked him to have pt call our office.

## 2015-09-09 ENCOUNTER — Telehealth: Payer: Self-pay

## 2015-09-09 NOTE — Telephone Encounter (Signed)
Patient is on my [employer] for 2017 and may a good candidate for an AWV.

## 2015-09-16 NOTE — Telephone Encounter (Signed)
Please contact patient to set up AVW and let Stefannie know once they are scheduled.

## 2015-09-16 NOTE — Telephone Encounter (Signed)
Patient is coming for for OV this Friday 09/18/15. I added a note on the appt desk that pt is due for AWV

## 2015-09-18 ENCOUNTER — Ambulatory Visit (INDEPENDENT_AMBULATORY_CARE_PROVIDER_SITE_OTHER): Payer: Medicare Other | Admitting: Family Medicine

## 2015-09-18 ENCOUNTER — Encounter: Payer: Self-pay | Admitting: Family Medicine

## 2015-09-18 VITALS — BP 116/70 | HR 74 | Temp 97.8°F | Resp 16 | Ht 72.0 in | Wt 168.8 lb

## 2015-09-18 DIAGNOSIS — F4321 Adjustment disorder with depressed mood: Secondary | ICD-10-CM

## 2015-09-18 DIAGNOSIS — I482 Chronic atrial fibrillation, unspecified: Secondary | ICD-10-CM

## 2015-09-18 DIAGNOSIS — E785 Hyperlipidemia, unspecified: Secondary | ICD-10-CM | POA: Diagnosis not present

## 2015-09-18 DIAGNOSIS — N183 Chronic kidney disease, stage 3 unspecified: Secondary | ICD-10-CM

## 2015-09-18 DIAGNOSIS — I10 Essential (primary) hypertension: Secondary | ICD-10-CM

## 2015-09-18 LAB — MICROALBUMIN / CREATININE URINE RATIO
Creatinine,U: 81.6 mg/dL
Microalb Creat Ratio: 82 mg/g — ABNORMAL HIGH (ref 0.0–30.0)
Microalb, Ur: 66.9 mg/dL — ABNORMAL HIGH (ref 0.0–1.9)

## 2015-09-18 LAB — BASIC METABOLIC PANEL
BUN: 23 mg/dL (ref 6–23)
CO2: 25 mEq/L (ref 19–32)
Calcium: 9.5 mg/dL (ref 8.4–10.5)
Chloride: 106 mEq/L (ref 96–112)
Creatinine, Ser: 1.9 mg/dL — ABNORMAL HIGH (ref 0.40–1.50)
GFR: 36.18 mL/min — ABNORMAL LOW (ref >60.00)
Glucose, Bld: 118 mg/dL — ABNORMAL HIGH (ref 70–99)
Potassium: 4.5 mEq/L (ref 3.5–5.1)
Sodium: 139 mEq/L (ref 135–145)

## 2015-09-18 NOTE — Progress Notes (Signed)
OFFICE VISIT  09/18/2015   CC:  Chief Complaint  Patient presents with  . Follow-up    Pt is not fasting.     HPI:    Patient is a 82 y.o. Caucasian male who presents for 6 mo f/u HTN, HLD, CRI stage III with hx of + microalbuminuria. Of note, he has f/u arranged with his cardiologist for 10/09/15 and he'll be getting a repeat echo prior to that appointment.  Unfortunately his wife died about a week ago: chronically ill.  He says he is grieving normally, doesn't want any help.  Monitors bp at home: avg 120/70.  Compliant with meds.  Compliant with zetia, no side effects.  Taking eliquis, denies signs of bleeding.   Past Medical History  Diagnosis Date  . Hyperlipidemia   . Chronic renal insufficiency, stage III (moderate)     CrCl@30  ml/min.  ?nephrotic syndrome in 46s?--no old records.  . Gout     Doing ok since off allopurinol 2015  . Atrial flutter ([place]) 10/2014    started on low dose eliquis by cardiologist  . Hypertension     takes meds daily  . BPH (benign prostatic hyperplasia)   . H/O exercise stress test     about 5 yrs. ago, saw cardiologist, had normal stress test, told NO need  to f/u /w cardiologist   . Depression   . History of anemia   . Colon polyps     Colonoscopy x 3 per pt--initial one with polyp but two 5 yr f/u colonoscopies normal per pt.  . Moderate to severe aortic stenosis   . Chronic leukopenia     Neutropenia, monocytosis also with chronic thrombocytopenia--Dr. Marin Olp following.  . Microhematuria     Renal u/s 08/2013 remarkable only for small nonobstructing stone in each kidney.  Urol feels like this is likely the source of the microhem, cystoscopy likely low yield--following stones with q53mo ultrasounds and office f/u ([facility])  . Subclinical hypothyroidism 06/2014    TSH 5.2, normal T4 and T3.    Past Surgical History  Procedure Laterality Date  . Prostate surgery      TURP 1980s/90s.--HELPED.  Marland Kitchen Eye surgery      cataract - right   . Colonoscopy    . Inguinal hernia repair  12/27/2011    Procedure: HERNIA REPAIR INGUINAL ADULT;  Surgeon: Odis Hollingshead, MD;  Location: [facility];  Service: General;  Laterality: Right;  . Tonsillectomy    . Transthoracic echocardiogram  07/22/14    EF 55-60%, AS worse than prior echo: mean gradient 44, peak >100: f/u 3 mo per cardiologist    Outpatient Prescriptions Prior to Visit  Medication Sig Dispense Refill  . amitriptyline (ELAVIL) 50 MG tablet Take 1 tablet (50 mg total) by mouth at bedtime. 90 tablet 1  . apixaban (ELIQUIS) 2.5 MG TABS tablet Take 1 tablet (2.5 mg total) by mouth 2 (two) times daily. Please call and schedule an appointment 60 tablet 2  . ezetimibe (ZETIA) 10 MG tablet Take 1 tablet (10 mg total) by mouth daily. 90 tablet 3  . lisinopril (PRINIVIL,ZESTRIL) 10 MG tablet Take 1 tablet (10 mg total) by mouth daily. 30 tablet 6  . metoprolol succinate (TOPROL-XL) 50 MG 24 hr tablet Take 1 tablet (50 mg total) by mouth daily. 90 tablet 3  . psyllium (METAMUCIL) 58.6 % packet Take 1 packet by mouth daily.    . tamsulosin (FLOMAX) 0.4 MG CAPS capsule Take 1 capsule (0.4 mg total)  by mouth daily. 90 capsule 1  . amoxicillin-clavulanate (AUGMENTIN) 875-125 MG tablet Take 1 tablet by mouth every 12 (twelve) hours. (Patient not taking: Reported on 09/18/2015) 10 tablet 0  . benzonatate (TESSALON) 100 MG capsule Take 1 capsule (100 mg total) by mouth 3 (three) times daily as needed for cough. (Patient not taking: Reported on 09/18/2015) 20 capsule 0   No facility-administered medications prior to visit.    No Known Allergies  ROS As per HPI  PE: Blood pressure 116/70, pulse 74, temperature 97.8 F (36.6 C), temperature source Oral, resp. rate 16, height 6' (1.829 m), weight 168 lb 12 oz (76.544 kg), SpO2 100 %. Gen: Alert, well appearing.  Patient is oriented to person, place, time, and situation. CV: irreg irreg rhythm, rate about 70s, 3/6 harsh systolic murmur.  No rub or  gallop. Chest is clear, no wheezing or rales. Normal symmetric air entry throughout both lung fields. No chest wall deformities or tenderness. EXT: no clubbing, cyanosis, or edema.   LABS:  Lab Results  Component Value Date   TSH 2.587 07/28/2015   Lab Results  Component Value Date   WBC 3.9* 07/28/2015   HGB 15.8 07/28/2015   HCT 45.1 07/28/2015   MCV 83.1 07/28/2015   PLT 174 07/28/2015   Lab Results  Component Value Date   CREATININE 1.84* 07/29/2015   BUN 29* 07/29/2015   NA 136 07/29/2015   K 4.1 07/29/2015   CL 108 07/29/2015   CO2 19* 07/29/2015   Lab Results  Component Value Date   ALT 19 06/20/2014   AST 21 06/20/2014   ALKPHOS 76 06/20/2014   BILITOT 1.2 06/20/2014   Lab Results  Component Value Date   CHOL 127 06/20/2014   Lab Results  Component Value Date   HDL 34.80* 06/20/2014   Lab Results  Component Value Date   LDLCALC 70 06/20/2014   Lab Results  Component Value Date   TRIG 111.0 06/20/2014   Lab Results  Component Value Date   CHOLHDL 4 06/20/2014   IMPRESSION AND PLAN:  1) HTN; The current medical regimen is effective;  continue present plan and medications. Lytes/cr today.  2) Hyperlipidemia; tolerating zetia.  Overdue for lipid check but pt not fasting. We discussed this and decided to have him come to next f/u visit fasting and we'll do FLP at that time.  3) CRI, stage III: pt continues to decline nephrology referral at this time. Hx of microalbuminuria.  Started ACE-I.  Will recheck urine microalbumin/cr today.  4) CARDIAC: A fib, on rate control and anticoagulation and is stable. Has mod/severe aortic stenosis.   He will getting followed up by his cardiologist with echo and o/v later this month.  5) Grief reaction: grieving for the loss of his wife 1 week ago. I gave emotional support today and gave him a Hospice pamphlet that specifically addresses options for people in our community who are grieving and may need  assistance.  An After Visit Summary was printed and given to the patient.  FOLLOW UP: Return in about 3 months (around 12/19/2015) for AWV.  Signed:  Crissie Sickles, MD           09/18/2015

## 2015-09-18 NOTE — Progress Notes (Signed)
Pre visit review using our clinic review tool, if applicable. No additional management support is needed unless otherwise documented below in the visit note. 

## 2015-09-24 NOTE — Telephone Encounter (Signed)
AWV scheduled for 12/18/2015

## 2015-10-03 ENCOUNTER — Emergency Department (HOSPITAL_BASED_OUTPATIENT_CLINIC_OR_DEPARTMENT_OTHER)
Admission: EM | Admit: 2015-10-03 | Discharge: 2015-10-03 | Disposition: A | Discharge status: Home / Self Care | Payer: Medicare Other | Attending: Emergency Medicine | Admitting: Emergency Medicine

## 2015-10-03 ENCOUNTER — Encounter (HOSPITAL_BASED_OUTPATIENT_CLINIC_OR_DEPARTMENT_OTHER): Payer: Self-pay | Admitting: *Deleted

## 2015-10-03 DIAGNOSIS — Z79899 Other long term (current) drug therapy: Secondary | ICD-10-CM | POA: Insufficient documentation

## 2015-10-03 DIAGNOSIS — E785 Hyperlipidemia, unspecified: Secondary | ICD-10-CM | POA: Insufficient documentation

## 2015-10-03 DIAGNOSIS — Z87891 Personal history of nicotine dependence: Secondary | ICD-10-CM | POA: Insufficient documentation

## 2015-10-03 DIAGNOSIS — N183 Chronic kidney disease, stage 3 (moderate): Secondary | ICD-10-CM | POA: Insufficient documentation

## 2015-10-03 DIAGNOSIS — F329 Major depressive disorder, single episode, unspecified: Secondary | ICD-10-CM | POA: Diagnosis not present

## 2015-10-03 DIAGNOSIS — H9201 Otalgia, right ear: Secondary | ICD-10-CM | POA: Diagnosis present

## 2015-10-03 DIAGNOSIS — H6123 Impacted cerumen, bilateral: Secondary | ICD-10-CM | POA: Diagnosis not present

## 2015-10-03 DIAGNOSIS — I129 Hypertensive chronic kidney disease with stage 1 through stage 4 chronic kidney disease, or unspecified chronic kidney disease: Secondary | ICD-10-CM | POA: Insufficient documentation

## 2015-10-03 MED ORDER — DOCUSATE SODIUM 50 MG/5ML PO LIQD
ORAL | Status: AC
  Filled 2015-10-03: qty 10

## 2015-10-03 MED ORDER — DOCUSATE SODIUM 50 MG/5ML PO LIQD
50.0000 mg | Freq: Once | ORAL | Status: AC
  Administered 2015-10-03: 50 mg via ORAL

## 2015-10-03 NOTE — Discharge Instructions (Signed)
Cerumen Impaction The structures of the external ear canal secrete a waxy substance known as cerumen. Excess cerumen can build up in the ear canal, causing a condition known as cerumen impaction. Cerumen impaction can cause ear pain and disrupt the function of the ear. The rate of cerumen production differs for each individual. In certain individuals, the configuration of the ear canal may decrease his or her ability to naturally remove cerumen. CAUSES Cerumen impaction is caused by excessive cerumen production or buildup. RISK FACTORS  Frequent use of swabs to clean ears.  Having narrow ear canals.  Having eczema.  Being dehydrated. SIGNS AND SYMPTOMS  Diminished hearing.  Ear drainage.  Ear pain.  Ear itch. TREATMENT Treatment may involve:  Over-the-counter or prescription ear drops to soften the cerumen.  Removal of cerumen by a health care provider. This may be done with:  Irrigation with warm water. This is the most common method of removal.  Ear curettes and other instruments.  Surgery. This may be done in severe cases. HOME CARE INSTRUCTIONS  Take medicines only as directed by your health care provider.  Do not insert objects into the ear with the intent of cleaning the ear. PREVENTION  Do not insert objects into the ear, even with the intent of cleaning the ear. Removing cerumen as a part of normal hygiene is not necessary, and the use of swabs in the ear canal is not recommended.  Drink enough water to keep your urine clear or pale yellow.  Control your eczema if you have it. SEEK MEDICAL CARE IF:  You develop ear pain.  You develop bleeding from the ear.  The cerumen does not clear after you use ear drops as directed.   This information is not intended to replace advice given to you by your health care provider. Make sure you discuss any questions you have with your health care provider.   Document Released: 05/12/2004 Document Revised: 04/25/2014  Document Reviewed: 11/19/2014 Elsevier Interactive Patient Education 2016 Elsevier Inc.  

## 2015-10-03 NOTE — ED Provider Notes (Addendum)
CSN: [id number]     Arrival date & time 10/03/15  0827 History   First MD Initiated Contact with Patient 10/03/15 [phone]     Chief Complaint  Patient presents with  . Otalgia     (Consider location/radiation/quality/duration/timing/severity/associated sxs/prior Treatment) HPI Comments: Patient presents with right ear pain. He states it's been aching for about a week. He describes as a dull ache. He denies any drainage from the ear. He denies any hearing loss. No URI symptoms. No fevers. No history of similar problems in the past. He states his left ear feels fine. He hasn't taken anything for the pain. He states a couple years ago he had to have his ears cleaned out due to excessive wax.  Patient is a 82 y.o. male presenting with ear pain.  Otalgia Associated symptoms: no congestion, no cough, no fever, no headaches, no rhinorrhea and no vomiting     Past Medical History  Diagnosis Date  . Hyperlipidemia   . Chronic renal insufficiency, stage III (moderate)     CrCl@30  ml/min.  ?nephrotic syndrome in 66s?--no old records.  Pt declines nephrology referral as of 2017  . Gout     Doing ok since off allopurinol 2015  . Atrial flutter ([place]) 10/2014    started on low dose eliquis by cardiologist  . Hypertension     takes meds daily  . BPH (benign prostatic hyperplasia)   . H/O exercise stress test     about 5 yrs. ago, saw cardiologist, had normal stress test, told NO need  to f/u /w cardiologist   . Depression   . History of anemia   . Colon polyps     Colonoscopy x 3 per pt--initial one with polyp but two 5 yr f/u colonoscopies normal per pt.  . Moderate to severe aortic stenosis   . Chronic leukopenia     Neutropenia, monocytosis also with chronic thrombocytopenia--Dr. Marin Olp following.  . Microhematuria     Renal u/s 08/2013 remarkable only for small nonobstructing stone in each kidney.  Urol feels like this is likely the source of the microhem, cystoscopy likely low  yield--following stones with q27mo ultrasounds and office f/u ([facility])  . Subclinical hypothyroidism 06/2014    TSH 5.2, normal T4 and T3.   Past Surgical History  Procedure Laterality Date  . Prostate surgery      TURP 1980s/90s.--HELPED.  Marland Kitchen Eye surgery      cataract - right  . Colonoscopy    . Inguinal hernia repair  12/27/2011    Procedure: HERNIA REPAIR INGUINAL ADULT;  Surgeon: Odis Hollingshead, MD;  Location: [facility];  Service: General;  Laterality: Right;  . Tonsillectomy    . Transthoracic echocardiogram  07/22/14    EF 55-60%, AS worse than prior echo: mean gradient 44, peak >100: f/u 3 mo per cardiologist   Family History  Problem Relation Age of Onset  . Arthritis Mother   . Hypertension Father    Social History  Substance Use Topics  . Smoking status: Former Smoker -- 1.00 packs/day for 32 years    Types: Cigarettes    Quit date: 12/05/1982  . Smokeless tobacco: Never Used  . Alcohol Use: No    Review of Systems  Constitutional: Negative for fever.  HENT: Positive for ear pain. Negative for congestion, postnasal drip and rhinorrhea.   Respiratory: Negative for cough.   Gastrointestinal: Negative for nausea and vomiting.  Neurological: Negative for dizziness and headaches.      Allergies  Review of patient's allergies indicates no known allergies.  Home Medications   Prior to Admission medications   Medication Sig Start Date End Date Taking? Authorizing Provider  amitriptyline (ELAVIL) 50 MG tablet Take 1 tablet (50 mg total) by mouth at bedtime. 05/11/15  Yes Tammi Sou, MD  apixaban (ELIQUIS) 2.5 MG TABS tablet Take 1 tablet (2.5 mg total) by mouth 2 (two) times daily. Please call and schedule an appointment 08/17/15  Yes [name], MD  ezetimibe (ZETIA) 10 MG tablet Take 1 tablet (10 mg total) by mouth daily. 05/11/15  Yes Tammi Sou, MD  lisinopril (PRINIVIL,ZESTRIL) 10 MG tablet Take 1 tablet (10 mg total) by mouth daily. 03/24/15   Yes Tammi Sou, MD  metoprolol succinate (TOPROL-XL) 50 MG 24 hr tablet Take 1 tablet (50 mg total) by mouth daily. 05/11/15  Yes Tammi Sou, MD  psyllium (METAMUCIL) 58.6 % packet Take 1 packet by mouth daily.   Yes Historical Provider, MD  tamsulosin (FLOMAX) 0.4 MG CAPS capsule Take 1 capsule (0.4 mg total) by mouth daily. 05/11/15  Yes Tammi Sou, MD   BP 178/85 mmHg  Pulse 76  Temp(Src) 98.1 F (36.7 C) (Oral)  Resp 16  Ht 6' (1.829 m)  Wt 170 lb (77.111 kg)  BMI 23.05 kg/m2  SpO2 100% Physical Exam  Constitutional: He is oriented to person, place, and time. He appears well-developed and well-nourished. No distress.  HENT:  Head: Normocephalic and atraumatic.  Both ear canals are noninflamed. There is no drainage. There is bilateral cerumen impactions however the right is greater than the left. There is no pain over the mastoid.  Neck: Normal range of motion. Neck supple.  Cardiovascular: Normal rate.   Pulmonary/Chest: Effort normal.  Lymphadenopathy:    He has no cervical adenopathy.  Neurological: He is alert and oriented to person, place, and time.  Skin: Skin is warm and dry.    ED Course  Procedures (including critical care time) Labs Review Labs Reviewed - No data to display  Imaging Review No results found. I have personally reviewed and evaluated these images and lab results as part of my medical decision-making.   EKG Interpretation None      MDM   Final diagnoses:  Cerumen impaction, bilateral    Patient's ears were both irrigated, after colace solution applied to ears. His left ear is completely clear following irrigation. His right ear still has some slight wax build up but is much improved. I don't see any evidence of infection in the ear. He was discharged home in good condition. He was encouraged to follow-up with his PCP if his symptoms are not improving or return here as needed for any worsening symptoms.    Malvin Johns,  MD 10/03/15 [place], MD 10/09/15 [phone]

## 2015-10-03 NOTE — ED Notes (Signed)
Pt reports R ear pain x1wk; denies decreased hearing, drainage from ear, fever, sore throat, cough, nasal congestion.

## 2015-10-05 ENCOUNTER — Ambulatory Visit (HOSPITAL_COMMUNITY): Payer: Medicare Other | Attending: Internal Medicine

## 2015-10-05 ENCOUNTER — Other Ambulatory Visit: Payer: Self-pay

## 2015-10-05 DIAGNOSIS — I350 Nonrheumatic aortic (valve) stenosis: Secondary | ICD-10-CM | POA: Diagnosis not present

## 2015-10-05 DIAGNOSIS — I517 Cardiomegaly: Secondary | ICD-10-CM | POA: Insufficient documentation

## 2015-10-05 DIAGNOSIS — I351 Nonrheumatic aortic (valve) insufficiency: Secondary | ICD-10-CM | POA: Insufficient documentation

## 2015-10-05 LAB — ECHOCARDIOGRAM COMPLETE
AO mean calculated velocity dopler: 344 cm/s
AV Area VTI index: 0.42 cm2/m2
AV Area VTI: 0.72 cm2
AV Area mean vel: 0.78 cm2
AV Mean grad: 54 mmHg
AV Peak grad: 88 mmHg
AV VEL mean LVOT/AV: 0.25
AV area mean vel ind: 0.39 cm2/m2
AV peak Index: 0.36
AV pk vel: 468 cm/s
AV vel: 0.83
Ao pk vel: 0.23 m/s
E decel time: 155 msec
E/e' ratio: 15.19
FS: 37 % (ref 28–44)
IVS/LV PW RATIO, ED: 1.13
LA ID, A-P, ES: 35 mm
LA diam end sys: 35 mm
LA diam index: 1.76 cm/m2
LA vol A4C: 68 ml
LA vol index: 36.7 mL/m2
LA vol: 73 mL
LV E/e' medial: 15.19
LV E/e'average: 15.19
LV PW d: 11.6 mm — AB (ref 0.6–1.1)
LV e' LATERAL: 7.9 cm/s
LVOT SV: 84 mL
LVOT VTI: 26.6 cm
LVOT area: 3.14 cm2
LVOT diameter: 20 mm
LVOT peak VTI: 0.26 cm
LVOT peak vel: 108 cm/s
MV Dec: 155
MV Peak grad: 6 mmHg
MV pk A vel: 51.8 m/s
MV pk E vel: 120 m/s
P 1/2 time: 438 ms
Reg peak vel: 238 cm/s
TDI e' lateral: 7.9
TDI e' medial: 8.01
TR max vel: 238 cm/s
VTI: 101 cm
Valve area index: 0.42
Valve area: 0.83 cm2

## 2015-10-09 ENCOUNTER — Encounter: Payer: Self-pay | Admitting: Cardiovascular Disease

## 2015-10-09 ENCOUNTER — Other Ambulatory Visit (HOSPITAL_COMMUNITY): Payer: Medicare Other

## 2015-10-09 ENCOUNTER — Ambulatory Visit (INDEPENDENT_AMBULATORY_CARE_PROVIDER_SITE_OTHER): Payer: Medicare Other | Admitting: Cardiovascular Disease

## 2015-10-09 VITALS — BP 132/60 | HR 83 | Ht 72.0 in | Wt 173.0 lb

## 2015-10-09 DIAGNOSIS — I10 Essential (primary) hypertension: Secondary | ICD-10-CM | POA: Diagnosis not present

## 2015-10-09 DIAGNOSIS — I350 Nonrheumatic aortic (valve) stenosis: Secondary | ICD-10-CM

## 2015-10-09 DIAGNOSIS — I483 Typical atrial flutter: Secondary | ICD-10-CM | POA: Diagnosis not present

## 2015-10-09 DIAGNOSIS — E785 Hyperlipidemia, unspecified: Secondary | ICD-10-CM

## 2015-10-09 NOTE — Progress Notes (Signed)
Chief Complaint  Patient presents with  . Follow-up     History of Present Illness: 82 yo male with history of HTN, HLD, CKD and aortic stenosis here today for cardiac follow up. I saw him in June 2015 for evaluation of aortic stenosis. Echo 08/21/13 in primary care with moderate to severe AS (mean gradient 37 mm Hg). At our first visit in 2015 he had no symptoms and has remained asymptomatic since then. Repeat echo 10/05/15 with progression of aortic stenosis. (mean gradient 55 mmHg, peak gradient 96 mmHg). He was found to be in atrial flutter at the office visit in July 2016 and was started on Eliquis.    He is here today for follow up. He has been feeling well without chest pain, SOB, LE edema, dizziness, near syncope or syncope.  He has been mowing his grass with no trouble.   Primary Care Physician: Tammi Sou, MD   Past Medical History  Diagnosis Date  . Hyperlipidemia   . Chronic renal insufficiency, stage III (moderate)     CrCl@30  ml/min.  ?nephrotic syndrome in 57s?--no old records.  Pt declines nephrology referral as of 2017  . Gout     Doing ok since off allopurinol 2015  . Atrial flutter ([place]) 10/2014    started on low dose eliquis by cardiologist  . Hypertension     takes meds daily  . BPH (benign prostatic hyperplasia)   . H/O exercise stress test     about 5 yrs. ago, saw cardiologist, had normal stress test, told NO need  to f/u /w cardiologist   . Depression   . History of anemia   . Colon polyps     Colonoscopy x 3 per pt--initial one with polyp but two 5 yr f/u colonoscopies normal per pt.  . Moderate to severe aortic stenosis   . Chronic leukopenia     Neutropenia, monocytosis also with chronic thrombocytopenia--Dr. Marin Olp following.  . Microhematuria     Renal u/s 08/2013 remarkable only for small nonobstructing stone in each kidney.  Urol feels like this is likely the source of the microhem, cystoscopy likely low yield--following stones with q73mo  ultrasounds and office f/u ([facility])  . Subclinical hypothyroidism 06/2014    TSH 5.2, normal T4 and T3.    Past Surgical History  Procedure Laterality Date  . Prostate surgery      TURP 1980s/90s.--HELPED.  Marland Kitchen Eye surgery      cataract - right  . Colonoscopy    . Inguinal hernia repair  12/27/2011    Procedure: HERNIA REPAIR INGUINAL ADULT;  Surgeon: Odis Hollingshead, MD;  Location: [facility];  Service: General;  Laterality: Right;  . Tonsillectomy    . Transthoracic echocardiogram  07/22/14    EF 55-60%, AS worse than prior echo: mean gradient 44, peak >100: f/u 3 mo per cardiologist    Current Outpatient Prescriptions  Medication Sig Dispense Refill  . amitriptyline (ELAVIL) 50 MG tablet Take 1 tablet (50 mg total) by mouth at bedtime. 90 tablet 1  . apixaban (ELIQUIS) 2.5 MG TABS tablet Take 1 tablet (2.5 mg total) by mouth 2 (two) times daily. Please call and schedule an appointment 60 tablet 2  . ezetimibe (ZETIA) 10 MG tablet Take 1 tablet (10 mg total) by mouth daily. 90 tablet 3  . lisinopril (PRINIVIL,ZESTRIL) 10 MG tablet Take 1 tablet (10 mg total) by mouth daily. 30 tablet 6  . metoprolol succinate (TOPROL-XL) 50 MG 24 hr tablet  Take 1 tablet (50 mg total) by mouth daily. 90 tablet 3  . psyllium (METAMUCIL) 58.6 % packet Take 1 packet by mouth daily.    . tamsulosin (FLOMAX) 0.4 MG CAPS capsule Take 1 capsule (0.4 mg total) by mouth daily. 90 capsule 1   No current facility-administered medications for this visit.    No Known Allergies  Social History   Social History  . Marital Status: Married    Spouse Name: N/A  . Number of Children: 1  . Years of Education: N/A   Occupational History  . Retired    Social History Main Topics  . Smoking status: Former Smoker -- 1.00 packs/day for 32 years    Types: Cigarettes    Quit date: 12/05/1982  . Smokeless tobacco: Never Used  . Alcohol Use: No  . Drug Use: No  . Sexual Activity: Not on file   Other Topics Concern    . Not on file   Social History Narrative   Widower as of 08/2015, has one son.   Orig from [place], [place].   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   [employer] education.   Tob 30 pack-yr hx, quit 1984.   Alcohol-none    Drugso-none   Likes to hunt and fish.  Still mows yard with push mower.    Family History  Problem Relation Age of Onset  . Arthritis Mother   . Hypertension Father     Review of Systems:  As stated in the HPI and otherwise negative.   BP 132/60 mmHg  Pulse 83  Ht 6' (1.829 m)  Wt 173 lb (78.472 kg)  BMI 23.46 kg/m2  SpO2 98%  Physical Examination: General: Well developed, well nourished, NAD HEENT: OP clear, mucus membranes moist SKIN: warm, dry. No rashes. Neuro: No focal deficits Musculoskeletal: Muscle strength 5/5 all ext Psychiatric: Mood and affect normal Neck: No JVD, no carotid bruits, no thyromegaly, no lymphadenopathy. Lungs:Clear bilaterally, no wheezes, rhonci, crackles Cardiovascular: Regular rate and rhythm. Harsh, loud systolic murmur. No gallops or rubs. Abdomen:Soft. Bowel sounds present. Non-tender.  Extremities: No lower extremity edema. Pulses are 2 + in the bilateral DP/PT.  Echo 10/05/15: Left ventricle: Systolic function was normal. The estimated  ejection fraction was in the range of 60% to 65%. The study is  not technically sufficient to allow evaluation of LV diastolic  function. - Aortic valve: AV is thickened, calcified with restricted motion.  Peak and mean gradients through the valve are 96 and 55 mm Hg  respectively consistent with severe to critical AS. There is mild  AI. SInce report of echo from 2016, mean gradient has increased  significantly - Left atrium: The atrium was mildly dilated.  EKG:  EKG is not ordered today. The ekg ordered today demonstrates   Recent Labs: 07/28/2015: B Natriuretic Peptide 68.9; Hemoglobin 15.8; Platelets 174; TSH 2.587 07/29/2015: Magnesium 1.7 09/18/2015: BUN 23; Creatinine, Ser  1.90*; Potassium 4.5; Sodium 139   Lipid Panel    Component Value Date/Time   CHOL 127 06/20/2014 0831   TRIG 111.0 06/20/2014 0831   HDL 34.80* 06/20/2014 0831   CHOLHDL 4 06/20/2014 0831   VLDL 22.2 06/20/2014 0831   LDLCALC 70 06/20/2014 0831     Wt Readings from Last 3 Encounters:  10/09/15 173 lb (78.472 kg)  10/03/15 170 lb (77.111 kg)  09/18/15 168 lb 12 oz (76.544 kg)     Other studies Reviewed: Additional studies/ records that were reviewed today include: . Review of  the above records demonstrates:    Assessment and Plan:   1. Aortic stenosis: He has critical AS by echo June 2017. I think he is at the point where AVR is indicated despite the fact that he is asymptomatic. He is very active. He has no signs of CHF. He has no chest pain, SOB, dizziness. He wishes to think about this. I discussed TAVR in detail today. Will see him back in 6 weeks to discuss TAVR further.  2. HTN: BP is controlled. No changes today.    3. HLD: He is on Zetia. Followed in primary care.   4. Atrial flutter: Rate controlled today on Toprol. I reviewed risk of CVA. His CHADSVASC score is 3 with annual risk of stroke of 3.2 %. Will continue Eliquis 2.5 mg po BID.   Current medicines are reviewed at length with the patient today.  The patient does not have concerns regarding medicines.  The following changes have been made:  Added Eliquis  Labs/ tests ordered today include:   No orders of the defined types were placed in this encounter.    Disposition:   FU with me in 6 weeks  Signed, Lauree Chandler, MD 10/09/2015 11:22 AM    [facility] [place], [place], [place]  [address] Phone: [phone]; Fax: [phone]

## 2015-10-09 NOTE — Patient Instructions (Signed)
Medication Instructions:   Your physician recommends that you continue on your current medications as directed. Please refer to the Current Medication list given to you today.    Labwork: none  Testing/Procedures: none  Follow-Up: Your physician recommends that you schedule a follow-up appointment in: 6 weeks. --Scheduled for August 3,2017 at 11:00    Any Other Special Instructions Will Be Listed Below (If Applicable).     If you need a refill on your cardiac medications before your next appointment, please call your pharmacy.

## 2015-10-30 ENCOUNTER — Other Ambulatory Visit (HOSPITAL_BASED_OUTPATIENT_CLINIC_OR_DEPARTMENT_OTHER): Payer: Medicare Other

## 2015-10-30 ENCOUNTER — Ambulatory Visit (HOSPITAL_BASED_OUTPATIENT_CLINIC_OR_DEPARTMENT_OTHER): Payer: Medicare Other | Admitting: Family

## 2015-10-30 ENCOUNTER — Encounter: Payer: Self-pay | Admitting: Family

## 2015-10-30 ENCOUNTER — Telehealth: Payer: Self-pay

## 2015-10-30 VITALS — BP 129/58 | HR 77 | Temp 98.2°F | Resp 16 | Ht 72.0 in | Wt 174.0 lb

## 2015-10-30 DIAGNOSIS — D696 Thrombocytopenia, unspecified: Secondary | ICD-10-CM | POA: Diagnosis not present

## 2015-10-30 DIAGNOSIS — D509 Iron deficiency anemia, unspecified: Secondary | ICD-10-CM

## 2015-10-30 DIAGNOSIS — D72819 Decreased white blood cell count, unspecified: Secondary | ICD-10-CM | POA: Diagnosis not present

## 2015-10-30 LAB — CBC WITH DIFFERENTIAL (CANCER CENTER ONLY)
BASO#: 0 10*3/uL (ref 0.0–0.2)
BASO%: 2.3 % — ABNORMAL HIGH (ref 0.0–2.0)
EOS%: 3.1 % (ref 0.0–7.0)
Eosinophils Absolute: 0 10*3/uL (ref 0.0–0.5)
HCT: 42.5 % (ref 38.7–49.9)
HGB: 15 g/dL (ref 13.0–17.1)
LYMPH#: 0.7 10*3/uL — ABNORMAL LOW (ref 0.9–3.3)
LYMPH%: 55.4 % — ABNORMAL HIGH (ref 14.0–48.0)
MCH: 29.3 pg (ref 28.0–33.4)
MCHC: 35.3 g/dL (ref 32.0–35.9)
MCV: 83 fL (ref 82–98)
MONO#: 0.4 10*3/uL (ref 0.1–0.9)
MONO%: 31.5 % — ABNORMAL HIGH (ref 0.0–13.0)
NEUT#: 0.1 10*3/uL — CL (ref 1.5–6.5)
NEUT%: 7.7 % — ABNORMAL LOW (ref 40.0–80.0)
Platelets: 125 10*3/uL — ABNORMAL LOW (ref 145–400)
RBC: 5.12 10*6/uL (ref 4.20–5.70)
RDW: 14.4 % (ref 11.1–15.7)
WBC: 1.3 10*3/uL — ABNORMAL LOW (ref 4.0–10.0)

## 2015-10-30 LAB — COMPREHENSIVE METABOLIC PANEL
ALT: 10 U/L (ref 0–55)
AST: 15 U/L (ref 5–34)
Albumin: 3.1 g/dL — ABNORMAL LOW (ref 3.5–5.0)
Alkaline Phosphatase: 84 U/L (ref 40–150)
Anion Gap: 9 mEq/L (ref 3–11)
BUN: 31.7 mg/dL — ABNORMAL HIGH (ref 7.0–26.0)
CO2: 21 mEq/L — ABNORMAL LOW (ref 22–29)
Calcium: 9.9 mg/dL (ref 8.4–10.4)
Chloride: 109 mEq/L (ref 98–109)
Creatinine: 2.1 mg/dL — ABNORMAL HIGH (ref 0.7–1.3)
EGFR: 29 mL/min/{1.73_m2} — ABNORMAL LOW (ref >90)
Glucose: 125 mg/dl (ref 70–140)
Potassium: 4.9 mEq/L (ref 3.5–5.1)
Sodium: 138 mEq/L (ref 136–145)
Total Bilirubin: 1.67 mg/dL — ABNORMAL HIGH (ref 0.20–1.20)
Total Protein: 6.4 g/dL (ref 6.4–8.3)

## 2015-10-30 LAB — FERRITIN: Ferritin: 114 ng/ml (ref 22–316)

## 2015-10-30 LAB — IRON AND TIBC
%SAT: 31 % (ref 20–55)
Iron: 80 ug/dL (ref 42–163)
TIBC: 261 ug/dL (ref 202–409)
UIBC: 181 ug/dL (ref 117–376)

## 2015-10-30 LAB — CHCC SATELLITE - SMEAR

## 2015-10-30 NOTE — Progress Notes (Signed)
Hematology and Oncology Follow Up Visit  Richard Sullivan [id number] 09/10/1930 82 y.o. 10/30/2015   Principle Diagnosis:  Leukopenia and thrombocytopenia  Current Therapy:   Observation     Interim History:  Richard Sullivan is here today for a follow-up. He continues to do well and has had no issue with infections. His WBC count is 1.3 and ANC 0.1.  He is staying busy mowing his lawn with a push mower and working out in the yard. He really enjoys being outside.  No fever, chills, n/v, cough, rash, dizziness, SOB, chest pain, palpitations, abdominal pain or changes in bowel or bladder habits. No splenomegaly or lymphadenopathy found on exam.  He has had no episodes of bleeding or bruising on Eliquis for atrial fib.  No swelling, tenderness, numbness or tingling in his extremities. No c/o joint or bone pain.  He has maintained a good appetite and is staying well hydrated. His weight is stable.   Medications:    Medication List       This list is accurate as of: 10/30/15 11:11 AM.  Always use your most recent med list.               amitriptyline 50 MG tablet  Commonly known as:  ELAVIL  Take 1 tablet (50 mg total) by mouth at bedtime.     apixaban 2.5 MG Tabs tablet  Commonly known as:  ELIQUIS  Take 1 tablet (2.5 mg total) by mouth 2 (two) times daily. Please call and schedule an appointment     ezetimibe 10 MG tablet  Commonly known as:  ZETIA  Take 1 tablet (10 mg total) by mouth daily.     lisinopril 10 MG tablet  Commonly known as:  PRINIVIL,ZESTRIL  Take 1 tablet (10 mg total) by mouth daily.     metoprolol succinate 50 MG 24 hr tablet  Commonly known as:  TOPROL-XL  Take 1 tablet (50 mg total) by mouth daily.     psyllium 58.6 % packet  Commonly known as:  METAMUCIL  Take 1 packet by mouth daily.     tamsulosin 0.4 MG Caps capsule  Commonly known as:  FLOMAX  Take 1 capsule (0.4 mg total) by mouth daily.        Allergies: No Known  Allergies  Past Medical History, Surgical history, Social history, and Family History were reviewed and updated.  Review of Systems: All other 10 point review of systems is negative.   Physical Exam:  height is 6' (1.829 m) and weight is 174 lb (78.926 kg). His oral temperature is 98.2 F (36.8 C). His blood pressure is 129/58 and his pulse is 77. His respiration is 16.   Wt Readings from Last 3 Encounters:  10/30/15 174 lb (78.926 kg)  10/09/15 173 lb (78.472 kg)  10/03/15 170 lb (77.111 kg)    Ocular: Sclerae unicteric, pupils equal, round and reactive to light Ear-nose-throat: Oropharynx clear, dentition fair Lymphatic: No cervical supraclavicular or axillary adenopathy Lungs no rales or rhonchi, good excursion bilaterally Heart regular rate and rhythm, no murmur appreciated Abd soft, nontender, positive bowel sounds, no liver or spleen tip palpated on exam, no fluid wave MSK no focal spinal tenderness, no joint edema Neuro: non-focal, well-oriented, appropriate affect Breasts: Deferred  Lab Results  Component Value Date   WBC 1.3* 10/30/2015   HGB 15.0 10/30/2015   HCT 42.5 10/30/2015   MCV 83 10/30/2015   PLT 125* 10/30/2015   Lab Results  Component Value  Date   FERRITIN 216 10/27/2014   IRON 76 10/27/2014   TIBC 241 10/27/2014   UIBC 165 10/27/2014   IRONPCTSAT 32 10/27/2014   Lab Results  Component Value Date   RETICCTPCT 1.7 10/27/2014   RBC 5.12 10/30/2015   RETICCTABS 85.2 10/27/2014   No results found for: KPAFRELGTCHN, LAMBDASER, KAPLAMBRATIO No results found for: IGGSERUM, IGA, IGMSERUM No results found for: Odetta Pink, SPEI   Chemistry      Component Value Date/Time   NA 139 09/18/2015 1129   K 4.5 09/18/2015 1129   CL 106 09/18/2015 1129   CO2 25 09/18/2015 1129   BUN 23 09/18/2015 1129   CREATININE 1.90* 09/18/2015 1129      Component Value Date/Time   CALCIUM 9.5 09/18/2015 1129    ALKPHOS 76 06/20/2014 0831   AST 21 06/20/2014 0831   ALT 19 06/20/2014 0831   BILITOT 1.2 06/20/2014 0831     Impression and Plan: Richard Sullivan is 82 yo white male with leukopenia and thrombocytopenia. He is doing quite well and remains asymptomatic with his low WBC count and ANC. He has no complaints at this time. No anemia at this time. His platelet count is stable at 125.  We will continue to follow along with him and plan to see him back again in 3 months.  He will contact our office with any questions or concerns. We can certainly see him sooner if need be.   Eliezer Bottom, NP 7/14/201711:11 AM

## 2015-10-30 NOTE — Telephone Encounter (Signed)
Critical low neutrophil count received and reported to Judson Roch, NP. No new orders given at this time. dph

## 2015-10-31 LAB — RETICULOCYTES: Reticulocyte Count: 1.3 % (ref 0.6–2.6)

## 2015-11-03 ENCOUNTER — Encounter: Payer: Self-pay | Admitting: *Deleted

## 2015-11-09 ENCOUNTER — Other Ambulatory Visit: Payer: Self-pay | Admitting: *Deleted

## 2015-11-09 MED ORDER — TAMSULOSIN HCL 0.4 MG PO CAPS: 0.4000 mg | ORAL_CAPSULE | Freq: Every day | ORAL | 1 refills | Status: DC

## 2015-11-09 MED ORDER — AMITRIPTYLINE HCL 50 MG PO TABS: 50.0000 mg | ORAL_TABLET | Freq: Every day | ORAL | 3 refills | Status: DC

## 2015-11-09 NOTE — Telephone Encounter (Signed)
RF request for amitriptyline - please advise. Thanks.  LOV: 09/18/15 Next ov: 12/18/15 Last written: 05/11/15 #90 w/ 1RF  RF request fortamsulosin - Rx sent Last written: 05/11/15 #90 w/ 1RF

## 2015-11-19 ENCOUNTER — Ambulatory Visit: Payer: Medicare Other | Admitting: Cardiovascular Disease

## 2015-11-28 ENCOUNTER — Other Ambulatory Visit: Payer: Self-pay | Admitting: Cardiovascular Disease

## 2015-11-30 ENCOUNTER — Other Ambulatory Visit: Payer: Self-pay | Admitting: *Deleted

## 2015-11-30 MED ORDER — LISINOPRIL 10 MG PO TABS
10.0000 mg | ORAL_TABLET | Freq: Every day | ORAL | 11 refills | Status: DC
Start: 2015-11-30 — End: 2016-10-08

## 2015-11-30 NOTE — Telephone Encounter (Signed)
[facility] [address]  RF request for lisinopril LOV: 09/18/15 Next ov: 12/18/15 Last written: 03/24/15 #30 w/ 6RF

## 2015-12-18 ENCOUNTER — Encounter: Payer: Self-pay | Admitting: Family Medicine

## 2015-12-18 ENCOUNTER — Ambulatory Visit (INDEPENDENT_AMBULATORY_CARE_PROVIDER_SITE_OTHER): Payer: Medicare Other | Admitting: Family Medicine

## 2015-12-18 VITALS — BP 139/63 | HR 62 | Temp 97.9°F | Resp 16 | Ht 71.0 in | Wt 169.8 lb

## 2015-12-18 DIAGNOSIS — I350 Nonrheumatic aortic (valve) stenosis: Secondary | ICD-10-CM

## 2015-12-18 DIAGNOSIS — I10 Essential (primary) hypertension: Secondary | ICD-10-CM

## 2015-12-18 DIAGNOSIS — E785 Hyperlipidemia, unspecified: Secondary | ICD-10-CM | POA: Diagnosis not present

## 2015-12-18 DIAGNOSIS — J449 Chronic obstructive pulmonary disease, unspecified: Secondary | ICD-10-CM

## 2015-12-18 LAB — BASIC METABOLIC PANEL
BUN: 28 mg/dL — ABNORMAL HIGH (ref 6–23)
CO2: 27 mEq/L (ref 19–32)
Calcium: 9.3 mg/dL (ref 8.4–10.5)
Chloride: 107 mEq/L (ref 96–112)
Creatinine, Ser: 2.14 mg/dL — ABNORMAL HIGH (ref 0.40–1.50)
GFR: 31.52 mL/min — ABNORMAL LOW (ref >60.00)
Glucose, Bld: 91 mg/dL (ref 70–99)
Potassium: 4.5 mEq/L (ref 3.5–5.1)
Sodium: 138 mEq/L (ref 135–145)

## 2015-12-18 LAB — LIPID PANEL
Cholesterol: 102 mg/dL (ref 0–200)
HDL: 30.4 mg/dL — ABNORMAL LOW (ref >39.00)
LDL Cholesterol: 54 mg/dL (ref 0–99)
NonHDL: 71.61
Total CHOL/HDL Ratio: 3
Triglycerides: 87 mg/dL (ref 0.0–149.0)
VLDL: 17.4 mg/dL (ref 0.0–40.0)

## 2015-12-18 NOTE — Progress Notes (Signed)
OFFICE VISIT  12/18/2015   CC: f/u chronic illness  HPI:  (Of note, our EMR was down during this pt's visit).  Patient is a 82 y.o. Caucasian male who presents for chronic illness f/u: COPD, HLD, HTN, mod-to-severe AS. No acute complaints.  Compliant with meds.  He push mows his yard w/out any problems.  He has routine cardiologist f/u in about 2 wks.    ROS: no dizziness, no CP, no SOB, no palpitations.  No recent cough or wheezing.  Past Medical History:  Diagnosis Date  . Atrial flutter ([place]) 10/2014   started on low dose eliquis by cardiologist  . BPH (benign prostatic hyperplasia)   . Chronic leukopenia    Neutropenia, monocytosis also with chronic thrombocytopenia--Dr. Marin Olp following.  . Chronic renal insufficiency, stage III (moderate)    CrCl@30  ml/min.  ?nephrotic syndrome in 17s?--no old records.  Pt declines nephrology referral as of 2017  . Colon polyps    Colonoscopy x 3 per pt--initial one with polyp but two 5 yr f/u colonoscopies normal per pt.  . Depression   . Gout    Doing ok since off allopurinol 2015  . H/O exercise stress test    about 5 yrs. ago, saw cardiologist, had normal stress test, told NO need  to f/u /w cardiologist   . History of anemia   . Hyperlipidemia   . Hypertension    takes meds daily  . Microhematuria    Renal u/s 08/2013 remarkable only for small nonobstructing stone in each kidney.  Urol feels like this is likely the source of the microhem, cystoscopy likely low yield--following stones with q50mo ultrasounds and office f/u ([facility])  . Moderate to severe aortic stenosis   . Subclinical hypothyroidism 06/2014   TSH 5.2, normal T4 and T3.    Past Surgical History:  Procedure Laterality Date  . COLONOSCOPY    . EYE SURGERY     cataract - right  . INGUINAL HERNIA REPAIR  12/27/2011   Procedure: HERNIA REPAIR INGUINAL ADULT;  Surgeon: Odis Hollingshead, MD;  Location: [facility];  Service: General;  Laterality: Right;  . PROSTATE SURGERY      TURP 1980s/90s.--HELPED.  . TONSILLECTOMY    . TRANSTHORACIC ECHOCARDIOGRAM  07/22/14   EF 55-60%, AS worse than prior echo: mean gradient 44, peak >100: f/u 3 mo per cardiologist    Outpatient Medications Prior to Visit  Medication Sig Dispense Refill  . amitriptyline (ELAVIL) 50 MG tablet Take 1 tablet (50 mg total) by mouth at bedtime. 90 tablet 3  . apixaban (ELIQUIS) 2.5 MG TABS tablet Take 1 tablet (2.5 mg total) by mouth 2 (two) times daily. 60 tablet 9  . ezetimibe (ZETIA) 10 MG tablet Take 1 tablet (10 mg total) by mouth daily. 90 tablet 3  . lisinopril (PRINIVIL,ZESTRIL) 10 MG tablet Take 1 tablet (10 mg total) by mouth daily. 30 tablet 11  . metoprolol succinate (TOPROL-XL) 50 MG 24 hr tablet Take 1 tablet (50 mg total) by mouth daily. 90 tablet 3  . psyllium (METAMUCIL) 58.6 % packet Take 1 packet by mouth daily.    . tamsulosin (FLOMAX) 0.4 MG CAPS capsule Take 1 capsule (0.4 mg total) by mouth daily. 90 capsule 1   No facility-administered medications prior to visit.     No Known Allergies  ROS As per HPI  PE: There were no vitals taken for this visit. Gen: Alert, well appearing.  Patient is oriented to person, place, time, and situation.  CV: RRR, 3/6 systolic murmur with hum-like quality, with 1/6 diastolic murmur.  No rub or gallop. Chest is clear, no wheezing or rales. Normal symmetric air entry throughout both lung fields. No chest wall deformities or tenderness.   LABS:  None today  IMPRESSION AND PLAN:  1) COPD: stable.  2) HLD: tolerating statin.  FLP today.  3) HTN: The current medical regimen is effective;  continue present plan and medications. Lytes/cr today.  4) Severe aortic stenosis: asymptomatic.  Per cardiologist note on 10/09/15, AVR is indicated despite his being asymptomatic, but pt wanted to think about it before agreeing.  He'll keep f/u with cardiologist coming up soon.  An After Visit Summary was printed and given to the  patient.  FOLLOW UP:  4 mo f/u chronic illness  Signed:  Crissie Sickles, MD           12/18/2015   '

## 2016-01-20 ENCOUNTER — Ambulatory Visit (INDEPENDENT_AMBULATORY_CARE_PROVIDER_SITE_OTHER): Payer: Medicare Other | Admitting: Cardiovascular Disease

## 2016-01-20 ENCOUNTER — Encounter: Payer: Self-pay | Admitting: Cardiovascular Disease

## 2016-01-20 VITALS — BP 142/60 | HR 64 | Ht 72.0 in | Wt 166.8 lb

## 2016-01-20 DIAGNOSIS — I10 Essential (primary) hypertension: Secondary | ICD-10-CM

## 2016-01-20 DIAGNOSIS — I350 Nonrheumatic aortic (valve) stenosis: Secondary | ICD-10-CM

## 2016-01-20 DIAGNOSIS — E7800 Pure hypercholesterolemia, unspecified: Secondary | ICD-10-CM | POA: Diagnosis not present

## 2016-01-20 DIAGNOSIS — I483 Typical atrial flutter: Secondary | ICD-10-CM

## 2016-01-20 NOTE — Progress Notes (Signed)
Chief Complaint  Patient presents with  . Atrial Fibrillation     History of Present Illness: 82 yo male with history of HTN, HLD, CKD, leukopenia, thrombocytopenic, atrial flutter and aortic stenosis here today for cardiac follow up. I saw him in June 2015 for evaluation of aortic stenosis. Echo 08/21/13 in primary care with moderate to severe AS (mean gradient 37 mm Hg). At our first visit in 2015 he had no symptoms and has remained asymptomatic since then. Repeat echo 10/05/15 with progression of aortic stenosis. (mean gradient 55 mmHg, peak gradient 96 mmHg). He was found to be in atrial flutter at the office visit in July 2016 and was started on Eliquis.  He is followed in oncology for his leukopenia and thrombocytopenia.   He is here today for follow up. He has been feeling well without chest pain, SOB, LE edema, dizziness, near syncope or syncope.  He has been mowing his grass with no trouble.   Primary Care Physician: Tammi Sou, MD   Past Medical History:  Diagnosis Date  . Atrial flutter ([place]) 10/2014   started on low dose eliquis by cardiologist  . BPH (benign prostatic hyperplasia)   . Chronic leukopenia    Neutropenia, monocytosis also with chronic thrombocytopenia--Dr. Marin Olp following.  . Chronic renal insufficiency, stage III (moderate)    CrCl@30  ml/min.  ?nephrotic syndrome in 62s?--no old records.  Pt declines nephrology referral as of 2017  . Colon polyps    Colonoscopy x 3 per pt--initial one with polyp but two 5 yr f/u colonoscopies normal per pt.  . Depression   . Gout    Doing ok since off allopurinol 2015  . H/O exercise stress test    about 5 yrs. ago, saw cardiologist, had normal stress test, told NO need  to f/u /w cardiologist   . History of anemia   . Hyperlipidemia   . Hypertension    takes meds daily  . Microhematuria    Renal u/s 08/2013 remarkable only for small nonobstructing stone in each kidney.  Urol feels like this is likely the source  of the microhem, cystoscopy likely low yield--following stones with q76mo ultrasounds and office f/u ([facility])  . Severe aortic stenosis    cardiologist recommended AVR 09/2015-pt thinking about it.  . Subclinical hypothyroidism 06/2014   TSH 5.2, normal T4 and T3.    Past Surgical History:  Procedure Laterality Date  . COLONOSCOPY    . EYE SURGERY     cataract - right  . INGUINAL HERNIA REPAIR  12/27/2011   Procedure: HERNIA REPAIR INGUINAL ADULT;  Surgeon: Odis Hollingshead, MD;  Location: [facility];  Service: General;  Laterality: Right;  . PROSTATE SURGERY     TURP 1980s/90s.--HELPED.  . TONSILLECTOMY    . TRANSTHORACIC ECHOCARDIOGRAM  07/22/14; 09/2015   EF 55-60%, AS worse than prior echo: mean gradient 44, peak >100: f/u 3 mo per cardiologist.  2017 f/u echo showed progression of AS to severe-to-critical.    Current Outpatient Prescriptions  Medication Sig Dispense Refill  . amitriptyline (ELAVIL) 50 MG tablet Take 1 tablet (50 mg total) by mouth at bedtime. 90 tablet 3  . apixaban (ELIQUIS) 2.5 MG TABS tablet Take 1 tablet (2.5 mg total) by mouth 2 (two) times daily. 60 tablet 9  . ezetimibe (ZETIA) 10 MG tablet Take 1 tablet (10 mg total) by mouth daily. 90 tablet 3  . lisinopril (PRINIVIL,ZESTRIL) 10 MG tablet Take 1 tablet (10 mg total) by mouth  daily. 30 tablet 11  . metoprolol succinate (TOPROL-XL) 50 MG 24 hr tablet Take 1 tablet (50 mg total) by mouth daily. 90 tablet 3  . psyllium (METAMUCIL) 58.6 % packet Take 1 packet by mouth daily.    . tamsulosin (FLOMAX) 0.4 MG CAPS capsule Take 1 capsule (0.4 mg total) by mouth daily. 90 capsule 1   No current facility-administered medications for this visit.     No Known Allergies  Social History   Social History  . Marital status: Married    Spouse name: N/A  . Number of children: 1  . Years of education: N/A   Occupational History  . Retired    Social History Main Topics  . Smoking status: Former Smoker    Packs/day:  1.00    Years: 32.00    Types: Cigarettes    Quit date: 12/05/1982  . Smokeless tobacco: Never Used  . Alcohol use No  . Drug use: No  . Sexual activity: Not on file   Other Topics Concern  . Not on file   Social History Narrative   Widower as of 08/2015, has one son.   Orig from [place], [place].   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   [employer] education.   Tob 30 pack-yr hx, quit 1984.   Alcohol-none    Drugso-none   Likes to hunt and fish.  Still mows yard with push mower.    Family History  Problem Relation Age of Onset  . Arthritis Mother   . Hypertension Father     Review of Systems:  As stated in the HPI and otherwise negative.   BP (!) 142/60   Pulse 64   Ht 6' (1.829 m)   Wt 166 lb 12.8 oz (75.7 kg)   BMI 22.62 kg/m   Physical Examination: General: Well developed, well nourished, NAD  HEENT: OP clear, mucus membranes moist  SKIN: warm, dry. No rashes. Neuro: No focal deficits  Musculoskeletal: Muscle strength 5/5 all ext  Psychiatric: Mood and affect normal  Neck: No JVD, no carotid bruits, no thyromegaly, no lymphadenopathy.  Lungs:Clear bilaterally, no wheezes, rhonci, crackles Cardiovascular: Regular rate and rhythm. Harsh, loud systolic murmur. No gallops or rubs. Abdomen:Soft. Bowel sounds present. Non-tender.  Extremities: No lower extremity edema. Pulses are 2 + in the bilateral DP/PT.  Echo 10/05/15: Left ventricle: Systolic function was normal. The estimated  ejection fraction was in the range of 60% to 65%. The study is  not technically sufficient to allow evaluation of LV diastolic  function. - Aortic valve: AV is thickened, calcified with restricted motion.  Peak and mean gradients through the valve are 96 and 55 mm Hg  respectively consistent with severe to critical AS. There is mild  AI. SInce report of echo from 2016, mean gradient has increased  significantly - Left atrium: The atrium was mildly dilated.  EKG:  EKG is not ordered  today. The ekg ordered today demonstrates   Recent Labs: 07/28/2015: B Natriuretic Peptide 68.9; TSH 2.587 07/29/2015: Magnesium 1.7 10/30/2015: ALT 10; HGB 15.0; Platelets 125 12/18/2015: BUN 28; Creatinine, Ser 2.14; Potassium 4.5; Sodium 138   Lipid Panel    Component Value Date/Time   CHOL 102 12/18/2015 0830   TRIG 87.0 12/18/2015 0830   HDL 30.40 (L) 12/18/2015 0830   CHOLHDL 3 12/18/2015 0830   VLDL 17.4 12/18/2015 0830   LDLCALC 54 12/18/2015 0830     Wt Readings from Last 3 Encounters:  01/20/16 166 lb 12.8 oz (  75.7 kg)  12/18/15 169 lb 12.8 oz (77 kg)  10/30/15 174 lb (78.9 kg)     Other studies Reviewed: Additional studies/ records that were reviewed today include: . Review of the above records demonstrates:    Assessment and Plan:   1. Aortic stenosis: He clearly has severe aortic stenosis. I have personally reviewed the echo images. The aortic valve is thickened, calcified with limited leaflet mobility. I think he would benefit from AVR. Given advanced age, he is not a good candidate for conventional AVR by surgical approach. I think he may be a good candidate for TAVR. He is a very functional 82 yo patient. I have reviewed the TAVR procedure in detail today with the patient. He remains asymptomatic. He does not wish to plan TAVR at this time. I have offered this to him as he will almost certainly be symptomatic soon. I will see him back in 6 months. He will call sooner with any changes in his clinical status.   2. HTN: BP is controlled. No changes today.    3. HLD: He is on Zetia. Followed in primary care.   4. Atrial flutter: Rate controlled today on Toprol. I reviewed risk of CVA. His CHADSVASC score is 3 with annual risk of stroke of 3.2 %. Will continue Eliquis 2.5 mg po BID.   Current medicines are reviewed at length with the patient today.  The patient does not have concerns regarding medicines.  The following changes have been made:  Added Eliquis  Labs/  tests ordered today include:   No orders of the defined types were placed in this encounter.   Disposition:   FU with me in 6 weeks  Signed, Lauree Chandler, MD 01/20/2016 1:27 PM    [facility] [address], [place], [place]  [address] Phone: [phone]; Fax: [phone]

## 2016-01-20 NOTE — Patient Instructions (Signed)

## 2016-01-25 ENCOUNTER — Encounter: Payer: Self-pay | Admitting: Family Medicine

## 2016-02-05 ENCOUNTER — Other Ambulatory Visit: Payer: Medicare Other

## 2016-02-05 ENCOUNTER — Ambulatory Visit: Payer: Medicare Other | Admitting: Hematology & Oncology

## 2016-02-13 IMAGING — US US ABDOMEN COMPLETE
1 series · 13 of 25 positions shown · non-contrast
Comparison: 08/21/2013

CLINICAL DATA: Thrombocytopenia, leukopenia.

EXAM:
ULTRASOUND ABDOMEN COMPLETE

[Series 1: us abdomen complete · 0.29mm/px · 13 of 75 slices shown]
[im 1/75]
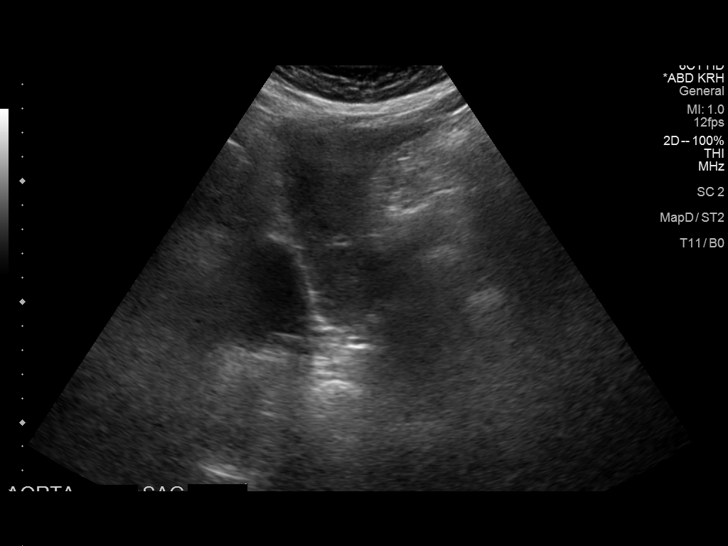
[im 7/75]
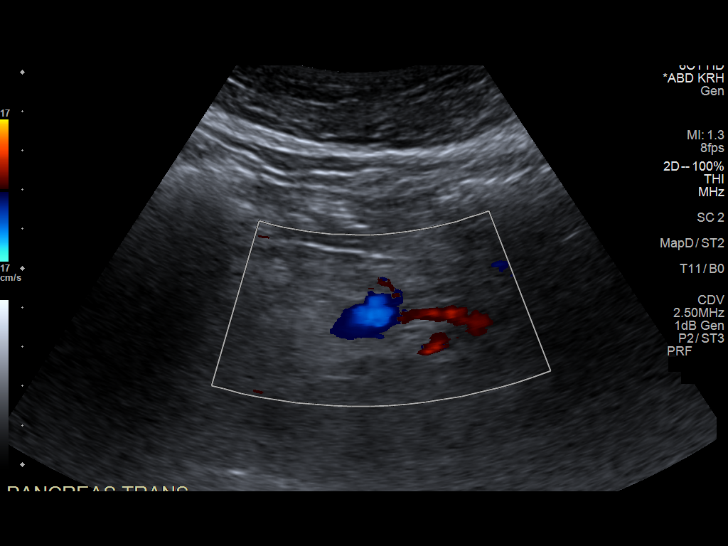
[im 13/75]
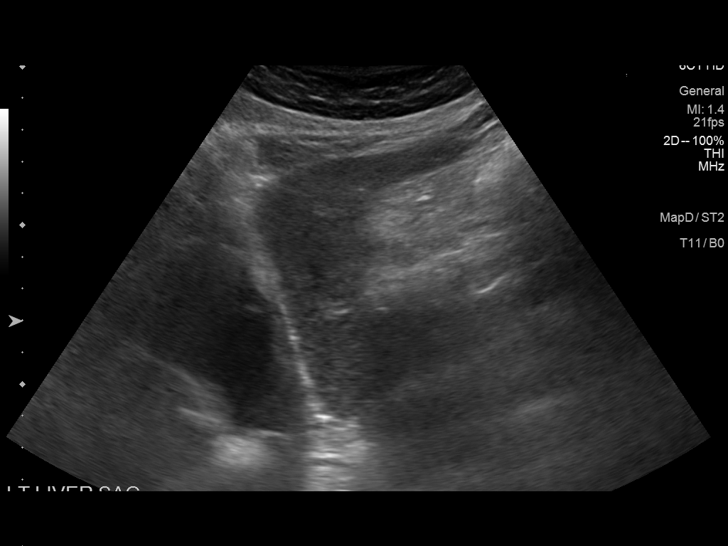
[im 19/75]
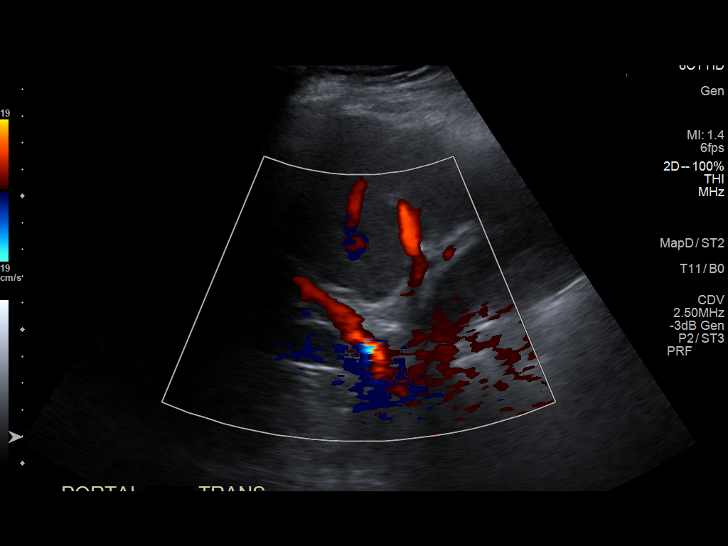
[im 25/75]
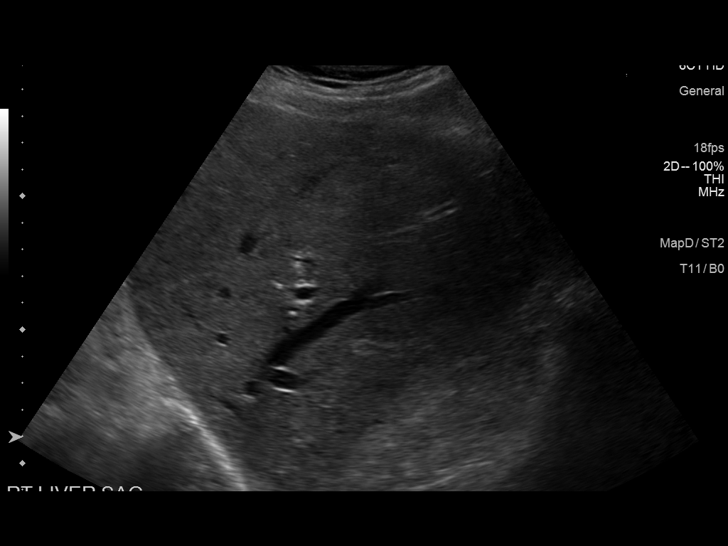
[im 31/75]
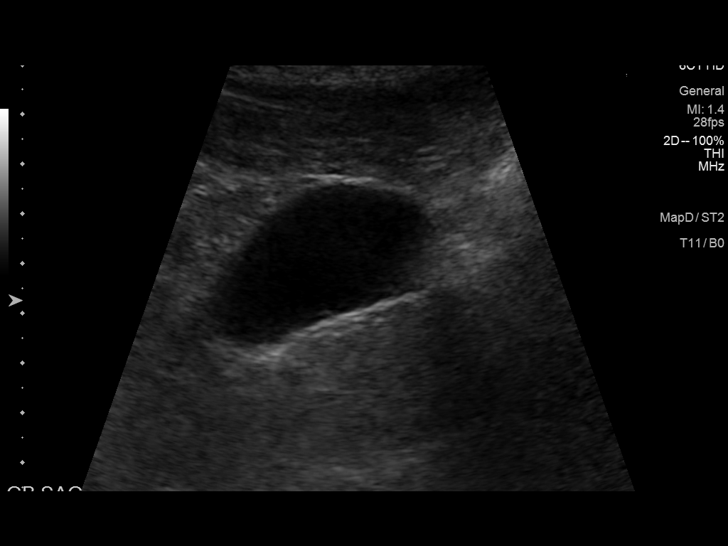
[im 38/75]
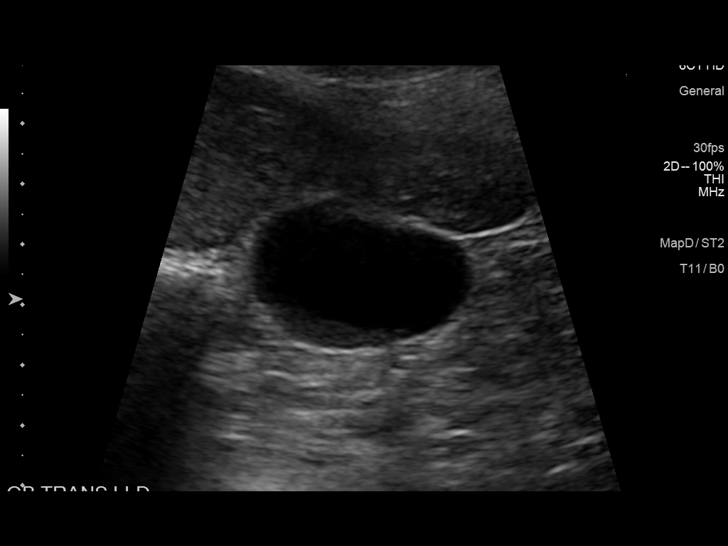
[im 44/75]
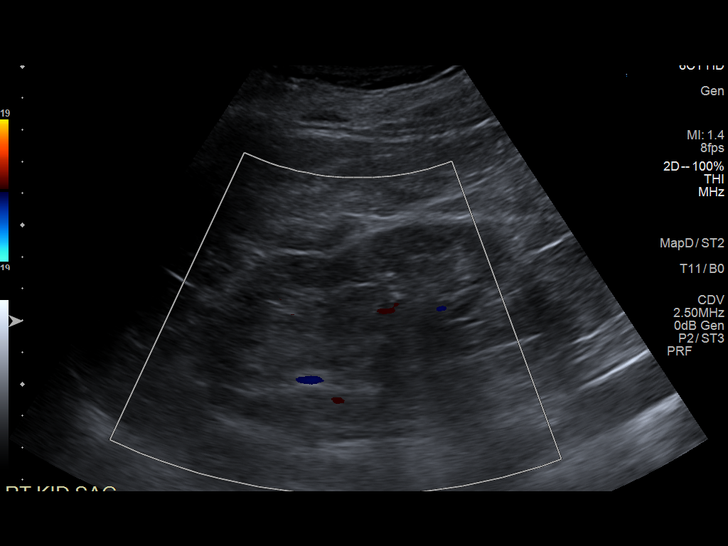
[im 50/75]
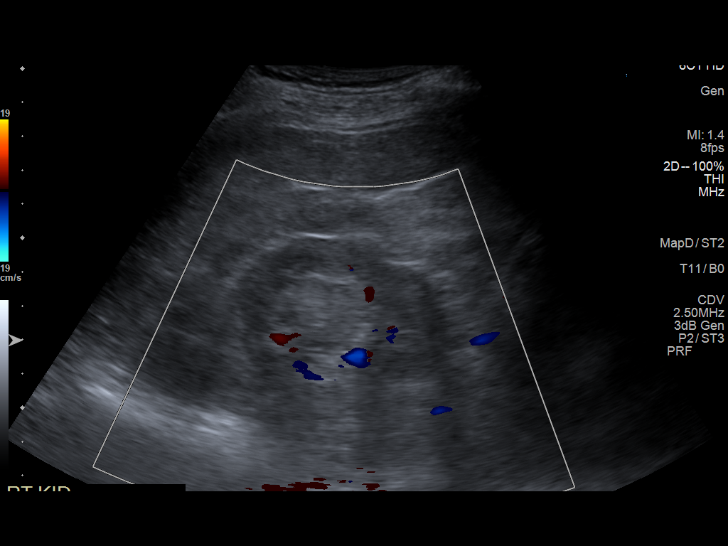
[im 56/75]
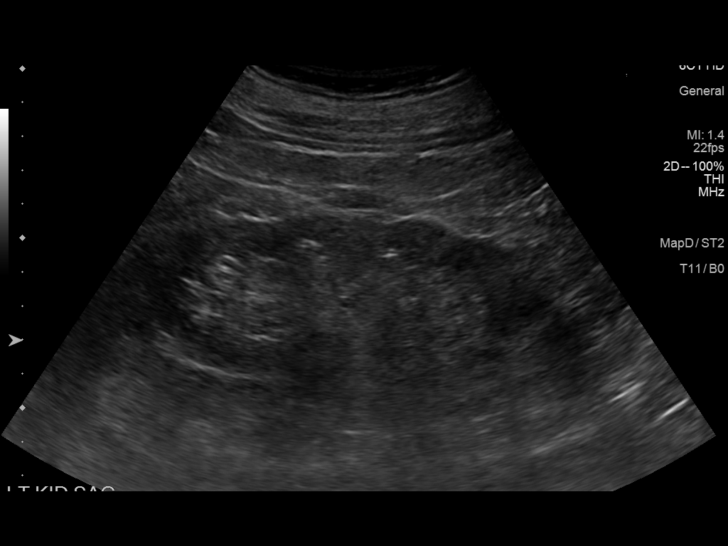
[im 62/75]
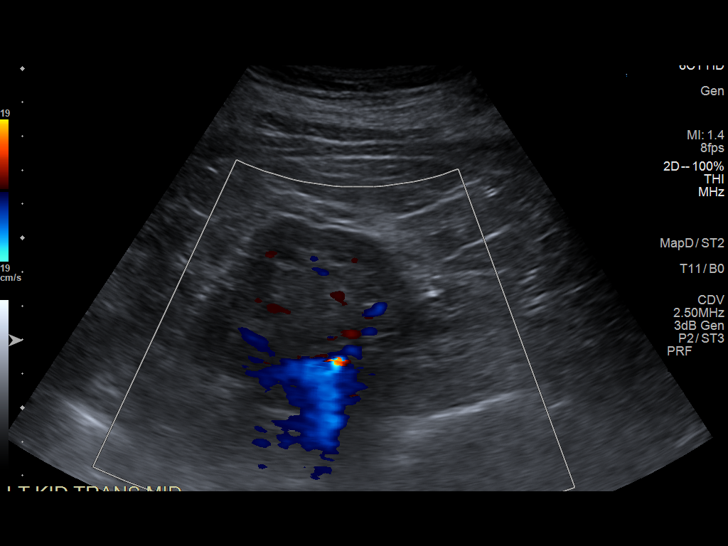
[im 68/75]
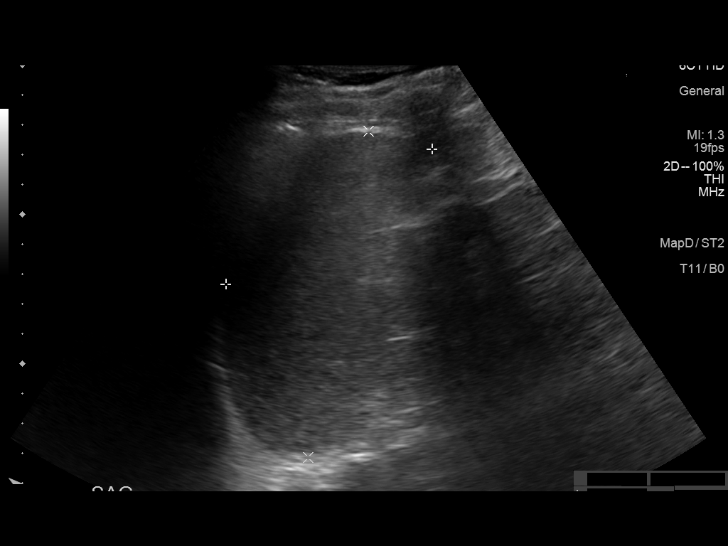
[im 75/75]
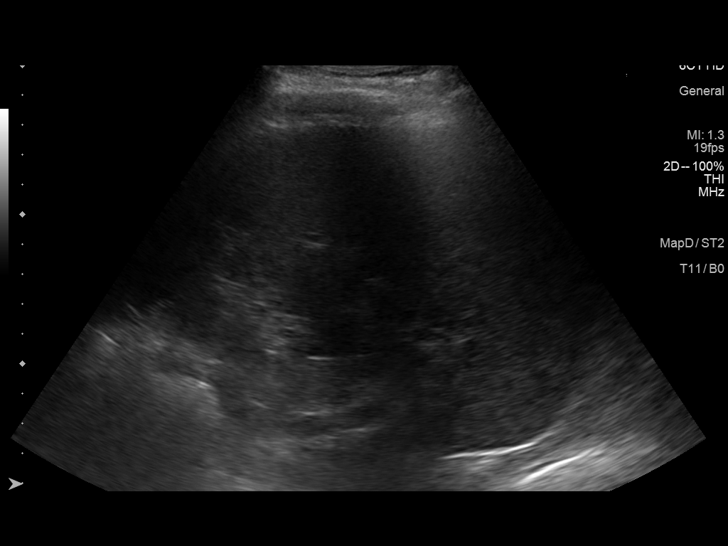

[13 of 25 positions shown; findings below may reference images not displayed]

FINDINGS: Gallbladder: Within the gallbladder mobile sludge is identified. The
gallbladder wall is normal in thickness, 2.0 mm. No sonographic
Murphy sign.

Common bile duct: Diameter: 1.0 mm

Liver: No focal lesion identified. Within normal limits in
parenchymal echogenicity.

IVC: No abnormality visualized.

Pancreas: Visualized portion unremarkable.

Spleen: 14 cm in length. Splenic volume is 667 cm cubic cm. No focal
splenic lesion identified.

Right Kidney: Length: 11.5 cm. Normal echogenicity. Renal parenchyma
appears thin. Possible small nonobstructing intrarenal calculi
versus atherosclerotic vasculature.

Left Kidney: Length: 11.6 cm. Suspect small parapelvic cysts. Renal
parenchyma appears thin. No solid mass identified. Possible small
nonobstructing intrarenal calculi versus atherosclerotic
vasculature.

Abdominal aorta: Poorly visualized.  Tortuous, measuring 2.2 cm.

Other findings: None.
IMPRESSION: 1. Splenomegaly.
2. Gallbladder sludge without other evidence for acute
cholecystitis.
3. Bilateral renal parenchymal thinning.  No hydronephrosis.
4. Possible small nonobstructing intrarenal calculi.

## 2016-02-17 DIAGNOSIS — N390 Urinary tract infection, site not specified: Secondary | ICD-10-CM

## 2016-02-17 HISTORY — DX: Urinary tract infection, site not specified: N39.0

## 2016-03-03 ENCOUNTER — Encounter: Payer: Self-pay | Admitting: Family Medicine

## 2016-03-03 ENCOUNTER — Ambulatory Visit (INDEPENDENT_AMBULATORY_CARE_PROVIDER_SITE_OTHER): Payer: Medicare Other | Admitting: Family Medicine

## 2016-03-03 VITALS — BP 179/81 | HR 123 | Temp 98.4°F | Resp 18 | Wt 156.8 lb

## 2016-03-03 DIAGNOSIS — N3001 Acute cystitis with hematuria: Secondary | ICD-10-CM

## 2016-03-03 DIAGNOSIS — R350 Frequency of micturition: Secondary | ICD-10-CM | POA: Diagnosis not present

## 2016-03-03 DIAGNOSIS — I4892 Unspecified atrial flutter: Secondary | ICD-10-CM | POA: Diagnosis not present

## 2016-03-03 DIAGNOSIS — R8290 Unspecified abnormal findings in urine: Secondary | ICD-10-CM | POA: Diagnosis not present

## 2016-03-03 LAB — POC URINALSYSI DIPSTICK (AUTOMATED)
Bilirubin, UA: NEGATIVE
Glucose, UA: NEGATIVE
Ketones, UA: NEGATIVE
Nitrite, UA: NEGATIVE
Spec Grav, UA: 1.015
Urobilinogen, UA: 0.2
pH, UA: 5.5

## 2016-03-03 MED ORDER — CEFTRIAXONE SODIUM 1 G IJ SOLR
1.0000 g | Freq: Once | INTRAMUSCULAR | Status: AC
  Administered 2016-03-03: 1 g via INTRAMUSCULAR

## 2016-03-03 MED ORDER — CEFDINIR 300 MG PO CAPS: 300.0000 mg | ORAL_CAPSULE | Freq: Two times a day (BID) | ORAL | 0 refills | Status: DC

## 2016-03-03 NOTE — Progress Notes (Signed)
Pre visit review using our clinic review tool, if applicable. No additional management support is needed unless otherwise documented below in the visit note. 

## 2016-03-03 NOTE — Patient Instructions (Signed)
Take 2 of your Toprol XL tabs once daily until I see you again next week.

## 2016-03-03 NOTE — Progress Notes (Signed)
OFFICE VISIT  03/03/2016   CC:  Chief Complaint  Patient presents with  . Urinary Tract Infection    Urinary frequency, small amounts   HPI:    Patient is a 82 y.o. Caucasian male who presents for urinary frequency. Onset 9 days ago with chills and abrupt loss of appetite, and his bp was running low.  He stopped his lisinopril as a result.  Then about 5d/a he started to get urinary frequency and urgency--"I go to the bathroom every 15 min and just a little comes out".  Denies any feeling of incomplete emptying.  No dysuria and no gross hematuria. Feels like appetite is back to about 20% of normal.  Denies any SOB or CP.  No nausea or vomiting or abd pain.  No back or flank pain.  No LE swelling. Very fatigued.    Past Medical History:  Diagnosis Date  . Atrial flutter ([place]) 10/2014   started on low dose eliquis by cardiologist  . BPH (benign prostatic hyperplasia)   . Chronic leukopenia    Neutropenia, monocytosis also with chronic thrombocytopenia--Dr. Marin Olp following.  . Chronic renal insufficiency, stage III (moderate)    CrCl@30  ml/min.  ?nephrotic syndrome in 65s?--no old records.  Pt declines nephrology referral as of 2017  . Colon polyps    Colonoscopy x 3 per pt--initial one with polyp but two 5 yr f/u colonoscopies normal per pt.  . Depression   . Gout    Doing ok since off allopurinol 2015  . H/O exercise stress test    about 5 yrs. ago, saw cardiologist, had normal stress test, told NO need  to f/u /w cardiologist   . History of anemia   . Hyperlipidemia   . Hypertension    takes meds daily  . Microhematuria    Renal u/s 08/2013 remarkable only for small nonobstructing stone in each kidney.  Urol feels like this is likely the source of the microhem, cystoscopy likely low yield--following stones with q26mo ultrasounds and office f/u ([facility])  . Severe aortic stenosis    cardiologist recommended TAVR 09/2015 and again 01/2016--pt declines this procedure b/c he is  asymptomatic.  . Subclinical hypothyroidism 06/2014   TSH 5.2, normal T4 and T3.    Past Surgical History:  Procedure Laterality Date  . COLONOSCOPY    . EYE SURGERY     cataract - right  . INGUINAL HERNIA REPAIR  12/27/2011   Procedure: HERNIA REPAIR INGUINAL ADULT;  Surgeon: Odis Hollingshead, MD;  Location: [facility];  Service: General;  Laterality: Right;  . PROSTATE SURGERY     TURP 1980s/90s.--HELPED.  . TONSILLECTOMY    . TRANSTHORACIC ECHOCARDIOGRAM  07/22/14; 09/2015   EF 55-60%, AS worse than prior echo: mean gradient 44, peak >100: f/u 3 mo per cardiologist.  2017 f/u echo showed progression of AS to severe-to-critical.    Outpatient Medications Prior to Visit  Medication Sig Dispense Refill  . amitriptyline (ELAVIL) 50 MG tablet Take 1 tablet (50 mg total) by mouth at bedtime. 90 tablet 3  . apixaban (ELIQUIS) 2.5 MG TABS tablet Take 1 tablet (2.5 mg total) by mouth 2 (two) times daily. 60 tablet 9  . ezetimibe (ZETIA) 10 MG tablet Take 1 tablet (10 mg total) by mouth daily. 90 tablet 3  . metoprolol succinate (TOPROL-XL) 50 MG 24 hr tablet Take 1 tablet (50 mg total) by mouth daily. 90 tablet 3  . psyllium (METAMUCIL) 58.6 % packet Take 1 packet by mouth daily.    Marland Kitchen  tamsulosin (FLOMAX) 0.4 MG CAPS capsule Take 1 capsule (0.4 mg total) by mouth daily. 90 capsule 1  . lisinopril (PRINIVIL,ZESTRIL) 10 MG tablet Take 1 tablet (10 mg total) by mouth daily. (Patient not taking: Reported on 03/03/2016) 30 tablet 11   No facility-administered medications prior to visit.     No Known Allergies  ROS As per HPI  PE: Blood pressure (!) 179/81, pulse (!) 123, temperature 98.4 F (36.9 C), temperature source Temporal, resp. rate 18, weight 156 lb 12.8 oz (71.1 kg), SpO2 90 %. Gen: alert, oriented x 4.  Tired appearing but in NAD. AFFECT: pleasant, lucid thought and speech. CV: Irregularly irregular, rate about 100-110, 5-0/3 harsh systolic murmur.  No rub. Chest is clear, no wheezing  or rales. Normal symmetric air entry throughout both lung fields. No chest wall deformities or tenderness. EXT: no clubbing, cyanosis, or edema.   LABS:  CC UA today: moderate blood, 100 mg/dl protein, LEU large.  Lab Results  Component Value Date   WBC 1.3 (L) 10/30/2015   HGB 15.0 10/30/2015   HCT 42.5 10/30/2015   MCV 83 10/30/2015   PLT 125 (L) 10/30/2015     Chemistry      Component Value Date/Time   NA 138 12/18/2015 0830   NA 138 10/30/2015 1047   K 4.5 12/18/2015 0830   K 4.9 10/30/2015 1047   CL 107 12/18/2015 0830   CO2 27 12/18/2015 0830   CO2 21 (L) 10/30/2015 1047   BUN 28 (H) 12/18/2015 0830   BUN 31.7 (H) 10/30/2015 1047   CREATININE 2.14 (H) 12/18/2015 0830   CREATININE 2.1 (H) 10/30/2015 1047      Component Value Date/Time   CALCIUM 9.3 12/18/2015 0830   CALCIUM 9.9 10/30/2015 1047   ALKPHOS 84 10/30/2015 1047   AST 15 10/30/2015 1047   ALT 10 10/30/2015 1047   BILITOT 1.67 (H) 10/30/2015 1047     Lab Results  Component Value Date   TSH 2.587 07/28/2015   T3TOTAL 131.2 06/24/2014    IMPRESSION AND PLAN:  1) UTI.  He feels awful. I gave him rocephin 1 g IM in office today. He'll start cefdinir 300 mg bid tomorrow. Urine sent for c/s.  2) A-fib/flutter, with RVR: I think his infection and relative dehydration has triggered this to be a bit worse than it normally is.  I recommended he go ahead and stay off his lisinopril for now, and he can take TWO of his 50mg  Toprol XL tabs once a day. This condition was not causing him any symptoms.  An After Visit Summary was printed and given to the patient.  FOLLOW UP: Return for 4-5 days, f/u UTI.  If not much improved, esp regarding oral intake, then will check CBC, CMET, and Thyroid studies at that time.  Signed:  Crissie Sickles, MD           03/03/2016

## 2016-03-06 ENCOUNTER — Encounter: Payer: Self-pay | Admitting: Family Medicine

## 2016-03-06 LAB — URINE CULTURE

## 2016-03-07 ENCOUNTER — Ambulatory Visit (INDEPENDENT_AMBULATORY_CARE_PROVIDER_SITE_OTHER): Payer: Medicare Other | Admitting: Family Medicine

## 2016-03-07 ENCOUNTER — Encounter: Payer: Self-pay | Admitting: Family Medicine

## 2016-03-07 VITALS — BP 132/68 | HR 76 | Temp 97.5°F | Resp 16 | Ht 72.0 in | Wt 157.5 lb

## 2016-03-07 DIAGNOSIS — I10 Essential (primary) hypertension: Secondary | ICD-10-CM

## 2016-03-07 DIAGNOSIS — N3001 Acute cystitis with hematuria: Secondary | ICD-10-CM

## 2016-03-07 DIAGNOSIS — I4892 Unspecified atrial flutter: Secondary | ICD-10-CM | POA: Diagnosis not present

## 2016-03-07 NOTE — Progress Notes (Signed)
Pre visit review using our clinic review tool, if applicable. No additional management support is needed unless otherwise documented below in the visit note. 

## 2016-03-07 NOTE — Progress Notes (Signed)
OFFICE VISIT  03/07/2016   CC:  Chief Complaint  Patient presents with  . Follow-up    UTI   HPI:    Patient is a 82 y.o. Caucasian male who presents for 4 day f/u UTI. Urine grew staph aureus (MSSA).  He feels much better, urinary frequency and urgency is MUCH improved. Appetite is coming back.   BP gradually came down and HR came down over the weekend.  Has been taking TWO toprol xl 50mg  tabs qd.  Past Medical History:  Diagnosis Date  . Atrial flutter ([place]) 10/2014   started on low dose eliquis by cardiologist  . BPH (benign prostatic hyperplasia)   . Chronic leukopenia    Neutropenia, monocytosis also with chronic thrombocytopenia--Dr. Marin Olp following.  . Chronic renal insufficiency, stage III (moderate)    CrCl@30  ml/min.  ?nephrotic syndrome in 69s?--no old records.  Pt declines nephrology referral as of 2017  . Colon polyps    Colonoscopy x 3 per pt--initial one with polyp but two 5 yr f/u colonoscopies normal per pt.  . Depression   . Gout    Doing ok since off allopurinol 2015  . H/O exercise stress test    about 5 yrs. ago, saw cardiologist, had normal stress test, told NO need  to f/u /w cardiologist   . History of anemia   . Hyperlipidemia   . Hypertension    takes meds daily  . Microhematuria    Renal u/s 08/2013 remarkable only for small nonobstructing stone in each kidney.  Urol feels like this is likely the source of the microhem, cystoscopy likely low yield--following stones with q38mo ultrasounds and office f/u ([facility])  . Severe aortic stenosis    cardiologist recommended TAVR 09/2015 and again 01/2016--pt declines this procedure b/c he is asymptomatic.  . Subclinical hypothyroidism 06/2014   TSH 5.2, normal T4 and T3.  Marland Kitchen UTI (urinary tract infection) 02/2016   MSSA    Past Surgical History:  Procedure Laterality Date  . COLONOSCOPY    . EYE SURGERY     cataract - right  . INGUINAL HERNIA REPAIR  12/27/2011   Procedure: HERNIA REPAIR INGUINAL  ADULT;  Surgeon: Odis Hollingshead, MD;  Location: [facility];  Service: General;  Laterality: Right;  . PROSTATE SURGERY     TURP 1980s/90s.--HELPED.  . TONSILLECTOMY    . TRANSTHORACIC ECHOCARDIOGRAM  07/22/14; 09/2015   EF 55-60%, AS worse than prior echo: mean gradient 44, peak >100: f/u 3 mo per cardiologist.  2017 f/u echo showed progression of AS to severe-to-critical.    Outpatient Medications Prior to Visit  Medication Sig Dispense Refill  . amitriptyline (ELAVIL) 50 MG tablet Take 1 tablet (50 mg total) by mouth at bedtime. 90 tablet 3  . apixaban (ELIQUIS) 2.5 MG TABS tablet Take 1 tablet (2.5 mg total) by mouth 2 (two) times daily. 60 tablet 9  . cefdinir (OMNICEF) 300 MG capsule Take 1 capsule (300 mg total) by mouth 2 (two) times daily. 18 capsule 0  . ezetimibe (ZETIA) 10 MG tablet Take 1 tablet (10 mg total) by mouth daily. 90 tablet 3  . metoprolol succinate (TOPROL-XL) 50 MG 24 hr tablet Take 1 tablet (50 mg total) by mouth daily. 90 tablet 3  . psyllium (METAMUCIL) 58.6 % packet Take 1 packet by mouth daily.    . tamsulosin (FLOMAX) 0.4 MG CAPS capsule Take 1 capsule (0.4 mg total) by mouth daily. 90 capsule 1  . lisinopril (PRINIVIL,ZESTRIL) 10 MG tablet  Take 1 tablet (10 mg total) by mouth daily. (Patient not taking: Reported on 03/07/2016) 30 tablet 11   No facility-administered medications prior to visit.     No Known Allergies  ROS As per HPI  PE: Blood pressure 132/68, pulse 76, temperature 97.5 F (36.4 C), temperature source Oral, resp. rate 16, height 6' (1.829 m), weight 157 lb 8 oz (71.4 kg), SpO2 100 %. BP recheck manually 132/68 Gen: Alert, well appearing.  Patient is oriented to person, place, time, and situation. AFFECT: pleasant, lucid thought and speech. CV: RRR, 1-6/6 harsh systolic murmur, no rub or gallop Chest is clear, no wheezing or rales. Normal symmetric air entry throughout both lung fields. No chest wall deformities or tenderness.  LABS:  None  today  IMPRESSION AND PLAN:  1) UTI: much improved.  MSSA.  Finish all cefdinir.  2) HTN and atrial flutter w/RVR: last visit he had this as a result of stopping his lisinopril (for low bp at home) and his infection/illness. He has regular rhythm at rate 75 today.   Instructions: Take ONE Toprol XL 50mg  tab once daily. Restart your lisinopril 10mg  once daily.  An After Visit Summary was printed and given to the patient.  FOLLOW UP: Return if symptoms worsen or fail to improve.  Signed:  Crissie Sickles, MD           03/07/2016

## 2016-03-07 NOTE — Patient Instructions (Signed)
Take ONE Toprol XL 50mg  tab once daily. Restart your lisinopril 10mg  once daily.

## 2016-03-16 ENCOUNTER — Ambulatory Visit (HOSPITAL_BASED_OUTPATIENT_CLINIC_OR_DEPARTMENT_OTHER): Payer: Medicare Other | Admitting: Hematology & Oncology

## 2016-03-16 ENCOUNTER — Other Ambulatory Visit (HOSPITAL_BASED_OUTPATIENT_CLINIC_OR_DEPARTMENT_OTHER): Payer: Medicare Other

## 2016-03-16 ENCOUNTER — Encounter: Payer: Self-pay | Admitting: Family Medicine

## 2016-03-16 VITALS — BP 135/58 | HR 75 | Temp 97.7°F | Resp 16 | Wt 160.0 lb

## 2016-03-16 DIAGNOSIS — R161 Splenomegaly, not elsewhere classified: Secondary | ICD-10-CM

## 2016-03-16 DIAGNOSIS — D696 Thrombocytopenia, unspecified: Secondary | ICD-10-CM | POA: Diagnosis not present

## 2016-03-16 DIAGNOSIS — D509 Iron deficiency anemia, unspecified: Secondary | ICD-10-CM

## 2016-03-16 DIAGNOSIS — N183 Chronic kidney disease, stage 3 unspecified: Secondary | ICD-10-CM

## 2016-03-16 DIAGNOSIS — R3121 Asymptomatic microscopic hematuria: Secondary | ICD-10-CM

## 2016-03-16 DIAGNOSIS — I4891 Unspecified atrial fibrillation: Secondary | ICD-10-CM

## 2016-03-16 DIAGNOSIS — D72819 Decreased white blood cell count, unspecified: Secondary | ICD-10-CM | POA: Diagnosis not present

## 2016-03-16 DIAGNOSIS — Z7901 Long term (current) use of anticoagulants: Secondary | ICD-10-CM

## 2016-03-16 LAB — CHCC SATELLITE - SMEAR

## 2016-03-16 LAB — COMPREHENSIVE METABOLIC PANEL
ALT: 17 U/L (ref 0–55)
AST: 20 U/L (ref 5–34)
Albumin: 3.1 g/dL — ABNORMAL LOW (ref 3.5–5.0)
Alkaline Phosphatase: 89 U/L (ref 40–150)
Anion Gap: 8 mEq/L (ref 3–11)
BUN: 45.8 mg/dL — ABNORMAL HIGH (ref 7.0–26.0)
CO2: 20 mEq/L — ABNORMAL LOW (ref 22–29)
Calcium: 10.4 mg/dL (ref 8.4–10.4)
Chloride: 113 mEq/L — ABNORMAL HIGH (ref 98–109)
Creatinine: 2.6 mg/dL — ABNORMAL HIGH (ref 0.7–1.3)
EGFR: 21 mL/min/{1.73_m2} — ABNORMAL LOW (ref >90)
Glucose: 94 mg/dl (ref 70–140)
Potassium: 5.8 mEq/L — ABNORMAL HIGH (ref 3.5–5.1)
Sodium: 141 mEq/L (ref 136–145)
Total Bilirubin: 0.93 mg/dL (ref 0.20–1.20)
Total Protein: 7 g/dL (ref 6.4–8.3)

## 2016-03-16 LAB — CBC WITH DIFFERENTIAL (CANCER CENTER ONLY)
BASO#: 0.1 10*3/uL (ref 0.0–0.2)
BASO%: 2.9 % — ABNORMAL HIGH (ref 0.0–2.0)
EOS%: 5.4 % (ref 0.0–7.0)
Eosinophils Absolute: 0.1 10*3/uL (ref 0.0–0.5)
HCT: 42.2 % (ref 38.7–49.9)
HGB: 14.8 g/dL (ref 13.0–17.1)
LYMPH#: 0.9 10*3/uL (ref 0.9–3.3)
LYMPH%: 42.6 % (ref 14.0–48.0)
MCH: 28.8 pg (ref 28.0–33.4)
MCHC: 35.1 g/dL (ref 32.0–35.9)
MCV: 82 fL (ref 82–98)
MONO#: 0.4 10*3/uL (ref 0.1–0.9)
MONO%: 17.2 % — ABNORMAL HIGH (ref 0.0–13.0)
NEUT#: 0.7 10*3/uL — ABNORMAL LOW (ref 1.5–6.5)
NEUT%: 31.9 % — ABNORMAL LOW (ref 40.0–80.0)
Platelets: 176 10*3/uL (ref 145–400)
RBC: 5.13 10*6/uL (ref 4.20–5.70)
RDW: 14.2 % (ref 11.1–15.7)
WBC: 2 10*3/uL — ABNORMAL LOW (ref 4.0–10.0)

## 2016-03-16 LAB — IRON AND TIBC
%SAT: 33 % (ref 20–55)
Iron: 90 ug/dL (ref 42–163)
TIBC: 271 ug/dL (ref 202–409)
UIBC: 181 ug/dL (ref 117–376)

## 2016-03-16 LAB — FERRITIN: Ferritin: 289 ng/ml (ref 22–316)

## 2016-03-16 NOTE — Progress Notes (Signed)
Hematology and Oncology Follow Up Visit  Richard Sullivan [id number] Oct 21, 1930 82 y.o. 03/16/2016   Principle Diagnosis:   Leukopenia and thrombocytopenia  Current Therapy:    Observation     Interim History:  Richard Sullivan is back for follow-up. He is doing okay. We last saw him back in July.   He had a good Thanksgiving. He really do not do all that much. He said that he had a urinary tract infection. I think he is put on some Keflex. This is much better.   He's had no problems with fever. He's had no rashes. He's had no bleeding. He's had no diarrhea.   There's not been any change in medications.  He continues on ELIQUIS for the atrial fibrillation.  Overall, his performance status is ECOG 1.  Medications:  Current Outpatient Prescriptions:  .  amitriptyline (ELAVIL) 50 MG tablet, Take 1 tablet (50 mg total) by mouth at bedtime., Disp: 90 tablet, Rfl: 3 .  apixaban (ELIQUIS) 2.5 MG TABS tablet, Take 1 tablet (2.5 mg total) by mouth 2 (two) times daily., Disp: 60 tablet, Rfl: 9 .  ezetimibe (ZETIA) 10 MG tablet, Take 1 tablet (10 mg total) by mouth daily., Disp: 90 tablet, Rfl: 3 .  lisinopril (PRINIVIL,ZESTRIL) 10 MG tablet, Take 1 tablet (10 mg total) by mouth daily., Disp: 30 tablet, Rfl: 11 .  metoprolol succinate (TOPROL-XL) 50 MG 24 hr tablet, Take 1 tablet (50 mg total) by mouth daily., Disp: 90 tablet, Rfl: 3 .  psyllium (METAMUCIL) 58.6 % packet, Take 1 packet by mouth daily., Disp: , Rfl:  .  tamsulosin (FLOMAX) 0.4 MG CAPS capsule, Take 1 capsule (0.4 mg total) by mouth daily., Disp: 90 capsule, Rfl: 1  Allergies: No Known Allergies  Past Medical History, Surgical history, Social history, and Family History were reviewed and updated.  Review of Systems: As above  Physical Exam:  weight is 160 lb (72.6 kg). His oral temperature is 97.7 F (36.5 C). His blood pressure is 135/58 (abnormal) and his pulse is 75. His respiration is 16.   Wt Readings from  Last 3 Encounters:  03/16/16 160 lb (72.6 kg)  03/07/16 157 lb 8 oz (71.4 kg)  03/03/16 156 lb 12.8 oz (71.1 kg)     Elderly white showman. Head and neck exam shows no ocular or oral lesions. There are no palpable cervical or supraclavicular lymph nodes. Lungs are clear. Cardiac exam regular rate and rhythm with a 2/6 systolic murmurs. . Abdomen is soft. He has good bowel sounds. There is no fluid wave. There is no palpable liver. His spleen might be palpable with deep inspiration. Back exam shows no tenderness over the spine, ribs or hips. Skin exam shows no rashes, ecchymoses or petechia. He does have the sites of the bee stings. There is minimal swelling. There is no erythema or tenderness. Neurological exam is nonfocal.  Lab Results  Component Value Date   WBC 2.0 (L) 03/16/2016   HGB 14.8 03/16/2016   HCT 42.2 03/16/2016   MCV 82 03/16/2016   PLT 176 03/16/2016     Chemistry      Component Value Date/Time   NA 138 12/18/2015 0830   NA 138 10/30/2015 1047   K 4.5 12/18/2015 0830   K 4.9 10/30/2015 1047   CL 107 12/18/2015 0830   CO2 27 12/18/2015 0830   CO2 21 (L) 10/30/2015 1047   BUN 28 (H) 12/18/2015 0830   BUN 31.7 (H) 10/30/2015 1047  CREATININE 2.14 (H) 12/18/2015 0830   CREATININE 2.1 (H) 10/30/2015 1047      Component Value Date/Time   CALCIUM 9.3 12/18/2015 0830   CALCIUM 9.9 10/30/2015 1047   ALKPHOS 84 10/30/2015 1047   AST 15 10/30/2015 1047   ALT 10 10/30/2015 1047   BILITOT 1.67 (H) 10/30/2015 1047         Impression and Plan: Richard Sullivan is 82 year old white male. He has leukopenia and thrombocytopenia. His white cell count is a little better. His neutrophils are a little bit higher.  I looked at his blood smear. I do not see anything that looked unusual. He had no immature myeloid or lymphoid forms. He had no hyper segmented polys.His platelets looked well granulated. The red cells were not nucleated. I saw no rouleau formation.  He does have  some slight splenomegaly on physical exam but nothing that appears to be worse.  Since he is asymptomatic, I really think we can just watch him for right now.  II want to get him back now in about 4 months. I think this would be reasonable for follow-up.   Volanda Napoleon, MD 11/29/201710:47 AM

## 2016-03-17 ENCOUNTER — Other Ambulatory Visit: Payer: Self-pay | Admitting: Hematology & Oncology

## 2016-03-17 ENCOUNTER — Ambulatory Visit (HOSPITAL_BASED_OUTPATIENT_CLINIC_OR_DEPARTMENT_OTHER): Payer: Medicare Other

## 2016-03-17 ENCOUNTER — Telehealth: Payer: Self-pay | Admitting: *Deleted

## 2016-03-17 DIAGNOSIS — D696 Thrombocytopenia, unspecified: Secondary | ICD-10-CM

## 2016-03-17 DIAGNOSIS — N183 Chronic kidney disease, stage 3 unspecified: Secondary | ICD-10-CM

## 2016-03-17 DIAGNOSIS — R161 Splenomegaly, not elsewhere classified: Secondary | ICD-10-CM

## 2016-03-17 DIAGNOSIS — D72819 Decreased white blood cell count, unspecified: Secondary | ICD-10-CM | POA: Diagnosis not present

## 2016-03-17 DIAGNOSIS — E875 Hyperkalemia: Secondary | ICD-10-CM

## 2016-03-17 LAB — BASIC METABOLIC PANEL - CANCER CENTER ONLY
BUN, Bld: 44 mg/dL — ABNORMAL HIGH (ref 7–22)
CO2: 21 mEq/L (ref 18–33)
Calcium: 9.7 mg/dL (ref 8.0–10.3)
Chloride: 112 mEq/L — ABNORMAL HIGH (ref 98–108)
Creat: 2.4 mg/dl — ABNORMAL HIGH (ref 0.6–1.2)
Glucose, Bld: 98 mg/dL (ref 73–118)
Potassium: 5.2 mEq/L — ABNORMAL HIGH (ref 3.3–4.7)
Sodium: 141 mEq/L (ref 128–145)

## 2016-03-17 LAB — RETICULOCYTES: Reticulocyte Count: 1.2 % (ref 0.6–2.6)

## 2016-03-17 NOTE — Addendum Note (Signed)
Addended by: Burney Gauze R on: 03/17/2016 11:20 AM   Modules accepted: Orders

## 2016-03-17 NOTE — Telephone Encounter (Signed)
-----   Message from Volanda Napoleon, MD sent at 03/17/2016 10:22 AM EST ----- I called Mr. Richard Sullivan. I told him about his labs. His potassium is 5.8. He has worsening renal function.  I told him that he needs come back to our office so we have his labs rechecked. If he still has hyperkalemia, then he probably will have to go to the emergency room and likely and up being admitted.  He will come to our office for lab work. I think this is very important that he do this today. He will come right now. I told him to make sure that he does not go anywhere until we get his labs back.  [name]

## 2016-03-31 ENCOUNTER — Other Ambulatory Visit: Payer: Medicare Other

## 2016-04-20 ENCOUNTER — Encounter: Payer: Self-pay | Admitting: Family Medicine

## 2016-04-20 ENCOUNTER — Ambulatory Visit (INDEPENDENT_AMBULATORY_CARE_PROVIDER_SITE_OTHER): Payer: Medicare Other | Admitting: Family Medicine

## 2016-04-20 VITALS — BP 128/76 | HR 74 | Temp 97.4°F | Resp 16 | Ht 72.0 in | Wt 165.0 lb

## 2016-04-20 DIAGNOSIS — Z0000 Encounter for general adult medical examination without abnormal findings: Secondary | ICD-10-CM

## 2016-04-20 DIAGNOSIS — E875 Hyperkalemia: Secondary | ICD-10-CM

## 2016-04-20 DIAGNOSIS — I10 Essential (primary) hypertension: Secondary | ICD-10-CM

## 2016-04-20 DIAGNOSIS — E7800 Pure hypercholesterolemia, unspecified: Secondary | ICD-10-CM | POA: Diagnosis not present

## 2016-04-20 DIAGNOSIS — N183 Chronic kidney disease, stage 3 unspecified: Secondary | ICD-10-CM

## 2016-04-20 LAB — BASIC METABOLIC PANEL
BUN: 35 mg/dL — ABNORMAL HIGH (ref 6–23)
CO2: 26 mEq/L (ref 19–32)
Calcium: 9.8 mg/dL (ref 8.4–10.5)
Chloride: 106 mEq/L (ref 96–112)
Creatinine, Ser: 2.25 mg/dL — ABNORMAL HIGH (ref 0.40–1.50)
GFR: 29.73 mL/min — ABNORMAL LOW (ref >60.00)
Glucose, Bld: 126 mg/dL — ABNORMAL HIGH (ref 70–99)
Potassium: 5.1 mEq/L (ref 3.5–5.1)
Sodium: 138 mEq/L (ref 135–145)

## 2016-04-20 NOTE — Progress Notes (Signed)
Subjective:   Richard Sullivan is a 83 y.o. male who presents for an Initial Medicare Annual Wellness Visit.  The Patient was informed that the wellness visit is to identify future health risk and educate and initiate measures that can reduce risk for increased disease through the lifespan.   Describes health as fair, good or great? "good" Active with beagle hunting dogs.  Review of Systems  No ROS.  Medicare Wellness Visit.  Cardiac Risk Factors include: advanced age (>1men, >36 women);dyslipidemia;family history of premature cardiovascular disease;hypertension;male gender   Sleep patterns: sleep 8 hours  Home Safety/Smoke Alarms: Smoke detectors in place.  Living environment; residence and Firearm Safety: Lives alone in single story home. Son lives next door. Firearms locked away.  Seat Belt Safety/Bike Helmet: Wears seat belt.   Counseling:   Eye Exam-Last exam > 5 years. Denies visual issues.   Dental-Last exam > 5 years. Full dentures. Gum care discussed.   Male:   CCS-colonoscopy 04/19/2003, no recall.  PSA- Followed by urology.    Objective:    Today's Vitals   04/20/16 1116  BP: 128/76  Pulse: 74  Resp: 16  Temp: 97.4 F (36.3 C)  TempSrc: Oral  SpO2: 99%  Weight: 165 lb (74.8 kg)  Height: 6' (1.829 m)   Body mass index is 22.38 kg/m.  Current Medications (verified) Outpatient Encounter Prescriptions as of 04/20/2016  Medication Sig  . amitriptyline (ELAVIL) 50 MG tablet Take 1 tablet (50 mg total) by mouth at bedtime.  Marland Kitchen apixaban (ELIQUIS) 2.5 MG TABS tablet Take 1 tablet (2.5 mg total) by mouth 2 (two) times daily.  Marland Kitchen ezetimibe (ZETIA) 10 MG tablet Take 1 tablet (10 mg total) by mouth daily.  Marland Kitchen lisinopril (PRINIVIL,ZESTRIL) 10 MG tablet Take 1 tablet (10 mg total) by mouth daily.  . metoprolol succinate (TOPROL-XL) 50 MG 24 hr tablet Take 1 tablet (50 mg total) by mouth daily.  . tamsulosin (FLOMAX) 0.4 MG CAPS capsule Take 1 capsule (0.4 mg total)  by mouth daily.  . [DISCONTINUED] psyllium (METAMUCIL) 58.6 % packet Take 1 packet by mouth daily.   No facility-administered encounter medications on file as of 04/20/2016.     Allergies (verified) Patient has no known allergies.   History: Past Medical History:  Diagnosis Date  . Atrial flutter ([place]) 10/2014   started on low dose eliquis by cardiologist  . BPH (benign prostatic hyperplasia)   . Chronic leukopenia    Neutropenia, monocytosis also with chronic thrombocytopenia--Dr. Marin Olp following; stable as of 03/16/16 f/u.  Marland Kitchen Chronic renal insufficiency, stage III (moderate)    CrCl@30  ml/min.  ?nephrotic syndrome in 28s?--no old records.  Pt declines nephrology referral as of 2017  . Colon polyps    Colonoscopy x 3 per pt--initial one with polyp but two 5 yr f/u colonoscopies normal per pt.  . Depression   . Gout    Doing ok since off allopurinol 2015  . H/O exercise stress test    about 5 yrs. ago, saw cardiologist, had normal stress test, told NO need  to f/u /w cardiologist   . History of anemia   . Hyperlipidemia   . Hypertension    takes meds daily  . Microhematuria    Renal u/s 08/2013 remarkable only for small nonobstructing stone in each kidney.  Urol feels like this is likely the source of the microhem, cystoscopy likely low yield--following stones with q46mo ultrasounds and office f/u ([facility])  . Severe aortic stenosis  cardiologist recommended TAVR 09/2015 and again 01/2016--pt declines this procedure b/c he is asymptomatic.  . Subclinical hypothyroidism 06/2014   TSH 5.2, normal T4 and T3.  Marland Kitchen UTI (urinary tract infection) 02/2016   MSSA   Past Surgical History:  Procedure Laterality Date  . COLONOSCOPY    . EYE SURGERY     cataract - right  . INGUINAL HERNIA REPAIR  12/27/2011   Procedure: HERNIA REPAIR INGUINAL ADULT;  Surgeon: Odis Hollingshead, MD;  Location: [facility];  Service: General;  Laterality: Right;  . PROSTATE SURGERY     TURP 1980s/90s.--HELPED.   . TONSILLECTOMY    . TRANSTHORACIC ECHOCARDIOGRAM  07/22/14; 09/2015   EF 55-60%, AS worse than prior echo: mean gradient 44, peak >100: f/u 3 mo per cardiologist.  2017 f/u echo showed progression of AS to severe-to-critical.   Family History  Problem Relation Age of Onset  . Arthritis Mother   . Hypertension Father    Social History   Occupational History  . Retired    Social History Main Topics  . Smoking status: Former Smoker    Packs/day: 1.00    Years: 32.00    Types: Cigarettes    Quit date: 12/05/1982  . Smokeless tobacco: Never Used  . Alcohol use No  . Drug use: No  . Sexual activity: Not on file   Tobacco Counseling Counseling given: No   Activities of Daily Living In your present state of health, do you have any difficulty performing the following activities: 04/20/2016 07/29/2015  Hearing? N N  Vision? N N  Difficulty concentrating or making decisions? N N  Walking or climbing stairs? N N  Dressing or bathing? N N  Doing errands, shopping? N N  Preparing Food and eating ? N -  Using the Toilet? N -  In the past six months, have you accidently leaked urine? N -  Do you have problems with loss of bowel control? N -  Managing your Medications? N -  Managing your Finances? N -  Housekeeping or managing your Housekeeping? N -  Some recent data might be hidden    Immunizations and Health Maintenance Immunization History  Administered Date(s) Administered  . Influenza,inj,Quad PF,36+ Mos 12/13/2013  . Influenza-Unspecified 12/30/2014, 12/07/2015  . Pneumococcal Conjugate-13 12/13/2013  . Pneumococcal Polysaccharide-23 03/20/2015  . Tdap 09/19/2014  . Zoster 01/09/2013   There are no preventive care reminders to display for this patient.  Patient Care Team: Tammi Sou, MD as PCP - General (Family Medicine) [name], MD as Consulting Physician (Cardiology) Volanda Napoleon, MD as Consulting Physician (Oncology) Ardis Hughs, MD as  Attending Physician (Urology)  Indicate any recent Medical Services you may have received from other than [facility] providers in the past year (date may be approximate).    Assessment:   This is a routine wellness examination for Adiel. Physical assessment deferred to PCP.   Hearing/Vision screen Hearing Screening Comments: Able to hear conversational tones w/o difficulty. No issues reported.   Vision Screening Comments: Reading glasses.   Dietary issues and exercise activities discussed: Exercise limited by: None identified Diet (meal preparation, eat out, water intake, caffeinated beverages, dairy products, fruits and vegetables): Drinks water.    Breakfast: Sausage biscuit, coffee (3-4 cups) Lunch: cheeseburger and fries Dinner:  Pinto beans, frozen dinners.   Discussed maintaining dietary intake and sodium content of frozen dinners. Encouraged to continue to drink water and stay active.   Goals  Patient Stated   . <enter goal here> (pt-stated)          Maintain current health by staying active.       Depression Screen PHQ 2/9 Scores 04/20/2016 09/19/2014  PHQ - 2 Score 0 0    Fall Risk Fall Risk  04/20/2016 03/16/2016 10/30/2015 04/29/2015 01/27/2015  Falls in the past year? No No No No No    Cognitive Function:       Ad8 score reviewed for issues:  Issues making decisions:no  Less interest in hobbies / activities:  Repeats questions, stories (family complaining):no  Trouble using ordinary gadgets (microwave, computer, phone):no  Forgets the month or year: no  Mismanaging finances: no  Remembering appts:no  Daily problems with thinking and/or memory:no Ad8 score is= 0     Screening Tests Health Maintenance  Topic Date Due  . TETANUS/TDAP  09/18/2024  . INFLUENZA VACCINE  Addressed  . ZOSTAVAX  Completed  . PNA vac Low Risk Adult  Completed        Plan:     Eat heart healthy diet (full of fruits, vegetables, whole grains, lean protein,  water--limit salt, fat, and sugar intake) and increase physical activity as tolerated.  Continue doing brain stimulating activities (puzzles, reading, adult coloring books, staying active) to keep memory sharp.    During the course of the visit Karion was educated and counseled about the following appropriate screening and preventive services:   Vaccines to include Pneumoccal, Influenza, Hepatitis B, Td, Zostavax, HCV  Colorectal cancer screening  Cardiovascular disease screening  Diabetes screening  Glaucoma screening  Nutrition counseling  Prostate cancer screening   Patient Instructions (the written plan) were given to the patient.   Gerilyn Nestle, RN   04/20/2016

## 2016-04-20 NOTE — Patient Instructions (Addendum)
Eat heart healthy diet (full of fruits, vegetables, whole grains, lean protein, water--limit salt, fat, and sugar intake) and increase physical activity as tolerated.  Continue doing brain stimulating activities (puzzles, reading, adult coloring books, staying active) to keep memory sharp.    Fall Prevention in the Home Introduction Falls can cause injuries. They can happen to people of all ages. There are many things you can do to make your home safe and to help prevent falls. What can I do on the outside of my home?  Regularly fix the edges of walkways and driveways and fix any cracks.  Remove anything that might make you trip as you walk through a door, such as a raised step or threshold.  Trim any bushes or trees on the path to your home.  Use bright outdoor lighting.  Clear any walking paths of anything that might make someone trip, such as rocks or tools.  Regularly check to see if handrails are loose or broken. Make sure that both sides of any steps have handrails.  Any raised decks and porches should have guardrails on the edges.  Have any leaves, snow, or ice cleared regularly.  Use sand or salt on walking paths during winter.  Clean up any spills in your garage right away. This includes oil or grease spills. What can I do in the bathroom?  Use night lights.  Install grab bars by the toilet and in the tub and shower. Do not use towel bars as grab bars.  Use non-skid mats or decals in the tub or shower.  If you need to sit down in the shower, use a plastic, non-slip stool.  Keep the floor dry. Clean up any water that spills on the floor as soon as it happens.  Remove soap buildup in the tub or shower regularly.  Attach bath mats securely with double-sided non-slip rug tape.  Do not have throw rugs and other things on the floor that can make you trip. What can I do in the bedroom?  Use night lights.  Make sure that you have a light by your bed that is easy to  reach.  Do not use any sheets or blankets that are too big for your bed. They should not hang down onto the floor.  Have a firm chair that has side arms. You can use this for support while you get dressed.  Do not have throw rugs and other things on the floor that can make you trip. What can I do in the kitchen?  Clean up any spills right away.  Avoid walking on wet floors.  Keep items that you use a lot in easy-to-reach places.  If you need to reach something above you, use a strong step stool that has a grab bar.  Keep electrical cords out of the way.  Do not use floor polish or wax that makes floors slippery. If you must use wax, use non-skid floor wax.  Do not have throw rugs and other things on the floor that can make you trip. What can I do with my stairs?  Do not leave any items on the stairs.  Make sure that there are handrails on both sides of the stairs and use them. Fix handrails that are broken or loose. Make sure that handrails are as long as the stairways.  Check any carpeting to make sure that it is firmly attached to the stairs. Fix any carpet that is loose or worn.  Avoid having throw rugs at  the top or bottom of the stairs. If you do have throw rugs, attach them to the floor with carpet tape.  Make sure that you have a light switch at the top of the stairs and the bottom of the stairs. If you do not have them, ask someone to add them for you. What else can I do to help prevent falls?  Wear shoes that:  Do not have high heels.  Have rubber bottoms.  Are comfortable and fit you well.  Are closed at the toe. Do not wear sandals.  If you use a stepladder:  Make sure that it is fully opened. Do not climb a closed stepladder.  Make sure that both sides of the stepladder are locked into place.  Ask someone to hold it for you, if possible.  Clearly mark and make sure that you can see:  Any grab bars or handrails.  First and last steps.  Where the  edge of each step is.  Use tools that help you move around (mobility aids) if they are needed. These include:  Canes.  Walkers.  Scooters.  Crutches.  Turn on the lights when you go into a dark area. Replace any light bulbs as soon as they burn out.  Set up your furniture so you have a clear path. Avoid moving your furniture around.  If any of your floors are uneven, fix them.  If there are any pets around you, be aware of where they are.  Review your medicines with your doctor. Some medicines can make you feel dizzy. This can increase your chance of falling. Ask your doctor what other things that you can do to help prevent falls. This information is not intended to replace advice given to you by your health care provider. Make sure you discuss any questions you have with your health care provider. Document Released: 01/29/2009 Document Revised: 09/10/2015 Document Reviewed: 05/09/2014  2017 Elsevier  Health Maintenance, Male A healthy lifestyle and preventative care can promote health and wellness.  Maintain regular health, dental, and eye exams.  Eat a healthy diet. Foods like vegetables, fruits, whole grains, low-fat dairy products, and lean protein foods contain the nutrients you need and are low in calories. Decrease your intake of foods high in solid fats, added sugars, and salt. Get information about a proper diet from your health care provider, if necessary.  Regular physical exercise is one of the most important things you can do for your health. Most adults should get at least 150 minutes of moderate-intensity exercise (any activity that increases your heart rate and causes you to sweat) each week. In addition, most adults need muscle-strengthening exercises on 2 or more days a week.   Maintain a healthy weight. The body mass index (BMI) is a screening tool to identify possible weight problems. It provides an estimate of body fat based on height and weight. Your health  care provider can find your BMI and can help you achieve or maintain a healthy weight. For males 20 years and older:  A BMI below 18.5 is considered underweight.  A BMI of 18.5 to 24.9 is normal.  A BMI of 25 to 29.9 is considered overweight.  A BMI of 30 and above is considered obese.  Maintain normal blood lipids and cholesterol by exercising and minimizing your intake of saturated fat. Eat a balanced diet with plenty of fruits and vegetables. Blood tests for lipids and cholesterol should begin at age 39 and be repeated every 5 years.  If your lipid or cholesterol levels are high, you are over age 78, or you are at high risk for heart disease, you may need your cholesterol levels checked more frequently.Ongoing high lipid and cholesterol levels should be treated with medicines if diet and exercise are not working.  If you smoke, find out from your health care provider how to quit. If you do not use tobacco, do not start.  Lung cancer screening is recommended for adults aged 32-80 years who are at high risk for developing lung cancer because of a history of smoking. A yearly low-dose CT scan of the lungs is recommended for people who have at least a 30-pack-year history of smoking and are current smokers or have quit within the past 15 years. A pack year of smoking is smoking an average of 1 pack of cigarettes a day for 1 year (for example, a 30-pack-year history of smoking could mean smoking 1 pack a day for 30 years or 2 packs a day for 15 years). Yearly screening should continue until the smoker has stopped smoking for at least 15 years. Yearly screening should be stopped for people who develop a health problem that would prevent them from having lung cancer treatment.  If you choose to drink alcohol, do not have more than 2 drinks per day. One drink is considered to be 12 oz (360 mL) of beer, 5 oz (150 mL) of wine, or 1.5 oz (45 mL) of liquor.  Avoid the use of street drugs. Do not share  needles with anyone. Ask for help if you need support or instructions about stopping the use of drugs.  High blood pressure causes heart disease and increases the risk of stroke. High blood pressure is more likely to develop in:  People who have blood pressure in the end of the normal range (100-139/85-89 mm Hg).  People who are overweight or obese.  People who are African American.  If you are 19-66 years of age, have your blood pressure checked every 3-5 years. If you are 59 years of age or older, have your blood pressure checked every year. You should have your blood pressure measured twice-once when you are at a hospital or clinic, and once when you are not at a hospital or clinic. Record the average of the two measurements. To check your blood pressure when you are not at a hospital or clinic, you can use:  An automated blood pressure machine at a pharmacy.  A home blood pressure monitor.  If you are 20-53 years old, ask your health care provider if you should take aspirin to prevent heart disease.  Diabetes screening involves taking a blood sample to check your fasting blood sugar level. This should be done once every 3 years after age 65 if you are at a normal weight and without risk factors for diabetes. Testing should be considered at a younger age or be carried out more frequently if you are overweight and have at least 1 risk factor for diabetes.  Colorectal cancer can be detected and often prevented. Most routine colorectal cancer screening begins at the age of 47 and continues through age 13. However, your health care provider may recommend screening at an earlier age if you have risk factors for colon cancer. On a yearly basis, your health care provider may provide home test kits to check for hidden blood in the stool. A small camera at the end of a tube may be used to directly examine the colon (sigmoidoscopy  or colonoscopy) to detect the earliest forms of colorectal cancer. Talk  to your health care provider about this at age 12 when routine screening begins. A direct exam of the colon should be repeated every 5-10 years through age 10, unless early forms of precancerous polyps or small growths are found.  People who are at an increased risk for hepatitis B should be screened for this virus. You are considered at high risk for hepatitis B if:  You were born in a country where hepatitis B occurs often. Talk with your health care provider about which countries are considered high risk.  Your parents were born in a high-risk country and you have not received a shot to protect against hepatitis B (hepatitis B vaccine).  You have HIV or AIDS.  You use needles to inject street drugs.  You live with, or have sex with, someone who has hepatitis B.  You are a man who has sex with other men (MSM).  You get hemodialysis treatment.  You take certain medicines for conditions like cancer, organ transplantation, and autoimmune conditions.  Hepatitis C blood testing is recommended for all people born from [birth year] through 1963 and any individual with known risk factors for hepatitis C.  Healthy men should no longer receive prostate-specific antigen (PSA) blood tests as part of routine cancer screening. Talk to your health care provider about prostate cancer screening.  Testicular cancer screening is not recommended for adolescents or adult males who have no symptoms. Screening includes self-exam, a health care provider exam, and other screening tests. Consult with your health care provider about any symptoms you have or any concerns you have about testicular cancer.  Practice safe sex. Use condoms and avoid high-risk sexual practices to reduce the spread of sexually transmitted infections (STIs).  You should be screened for STIs, including gonorrhea and chlamydia if:  You are sexually active and are younger than 24 years.  You are older than 24 years, and your health care  provider tells you that you are at risk for this type of infection.  Your sexual activity has changed since you were last screened, and you are at an increased risk for chlamydia or gonorrhea. Ask your health care provider if you are at risk.  If you are at risk of being infected with HIV, it is recommended that you take a prescription medicine daily to prevent HIV infection. This is called pre-exposure prophylaxis (PrEP). You are considered at risk if:  You are a man who has sex with other men (MSM).  You are a heterosexual man who is sexually active with multiple partners.  You take drugs by injection.  You are sexually active with a partner who has HIV.  Talk with your health care provider about whether you are at high risk of being infected with HIV. If you choose to begin PrEP, you should first be tested for HIV. You should then be tested every 3 months for as long as you are taking PrEP.  Use sunscreen. Apply sunscreen liberally and repeatedly throughout the day. You should seek shade when your shadow is shorter than you. Protect yourself by wearing long sleeves, pants, a wide-brimmed hat, and sunglasses year round whenever you are outdoors.  Tell your health care provider of new moles or changes in moles, especially if there is a change in shape or color. Also, tell your health care provider if a mole is larger than the size of a pencil eraser.  A one-time screening for abdominal  aortic aneurysm (AAA) and surgical repair of large AAAs by ultrasound is recommended for men aged 6-75 years who are current or former smokers.  Stay current with your vaccines (immunizations). This information is not intended to replace advice given to you by your health care provider. Make sure you discuss any questions you have with your health care provider. Document Released: 10/01/2007 Document Revised: 04/25/2014 Document Reviewed: 01/06/2015 Elsevier Interactive Patient Education  2017 [employer].

## 2016-04-20 NOTE — Progress Notes (Signed)
OFFICE VISIT  04/20/2016   CC:  Chief Complaint  Patient presents with  . Follow-up    RCI, Pt is not fasting.    HPI:    Patient is a 83 y.o. Caucasian male who presents for 4 mo f/u  HLD, HTN. He is followed by cardiology for moderate to severe aortic stenosis and atrial fibrillation. Pt feeling well. Denies any SOB or CP with walking.  No exercise lately more than walking due to freezing weather. No cough or fever lately.  He had moderate hyperkalemia on blood test by his oncologist at the end of November 2017.  Repeat the next day showed it came down into mild elevated range.  HTN: home monitoring 125-130 over 70s.  HLD: takes zetia daily w/out side effect.  ROS: no excessive bruising.  No nosebleeds, no blood in stool or urine.   Past Medical History:  Diagnosis Date  . Atrial flutter ([place]) 10/2014   started on low dose eliquis by cardiologist  . BPH (benign prostatic hyperplasia)   . Chronic leukopenia    Neutropenia, monocytosis also with chronic thrombocytopenia--Dr. Marin Olp following; stable as of 03/16/16 f/u.  Marland Kitchen Chronic renal insufficiency, stage III (moderate)    CrCl@30  ml/min.  ?nephrotic syndrome in 61s?--no old records.  Pt declines nephrology referral as of 2017  . Colon polyps    Colonoscopy x 3 per pt--initial one with polyp but two 5 yr f/u colonoscopies normal per pt.  . Depression   . Gout    Doing ok since off allopurinol 2015  . H/O exercise stress test    about 5 yrs. ago, saw cardiologist, had normal stress test, told NO need  to f/u /w cardiologist   . History of anemia   . Hyperlipidemia   . Hypertension    takes meds daily  . Microhematuria    Renal u/s 08/2013 remarkable only for small nonobstructing stone in each kidney.  Urol feels like this is likely the source of the microhem, cystoscopy likely low yield--following stones with q54mo ultrasounds and office f/u ([facility])  . Severe aortic stenosis    cardiologist recommended TAVR 09/2015  and again 01/2016--pt declines this procedure b/c he is asymptomatic.  . Subclinical hypothyroidism 06/2014   TSH 5.2, normal T4 and T3.  Marland Kitchen UTI (urinary tract infection) 02/2016   MSSA    Past Surgical History:  Procedure Laterality Date  . COLONOSCOPY    . EYE SURGERY     cataract - right  . INGUINAL HERNIA REPAIR  12/27/2011   Procedure: HERNIA REPAIR INGUINAL ADULT;  Surgeon: Odis Hollingshead, MD;  Location: [facility];  Service: General;  Laterality: Right;  . PROSTATE SURGERY     TURP 1980s/90s.--HELPED.  . TONSILLECTOMY    . TRANSTHORACIC ECHOCARDIOGRAM  07/22/14; 09/2015   EF 55-60%, AS worse than prior echo: mean gradient 44, peak >100: f/u 3 mo per cardiologist.  2017 f/u echo showed progression of AS to severe-to-critical.    Outpatient Medications Prior to Visit  Medication Sig Dispense Refill  . amitriptyline (ELAVIL) 50 MG tablet Take 1 tablet (50 mg total) by mouth at bedtime. 90 tablet 3  . apixaban (ELIQUIS) 2.5 MG TABS tablet Take 1 tablet (2.5 mg total) by mouth 2 (two) times daily. 60 tablet 9  . ezetimibe (ZETIA) 10 MG tablet Take 1 tablet (10 mg total) by mouth daily. 90 tablet 3  . lisinopril (PRINIVIL,ZESTRIL) 10 MG tablet Take 1 tablet (10 mg total) by mouth daily. 30 tablet  11  . metoprolol succinate (TOPROL-XL) 50 MG 24 hr tablet Take 1 tablet (50 mg total) by mouth daily. 90 tablet 3  . tamsulosin (FLOMAX) 0.4 MG CAPS capsule Take 1 capsule (0.4 mg total) by mouth daily. 90 capsule 1  . psyllium (METAMUCIL) 58.6 % packet Take 1 packet by mouth daily.     No facility-administered medications prior to visit.     No Known Allergies  ROS As per HPI  PE: Blood pressure 128/76, pulse 74, temperature 97.4 F (36.3 C), temperature source Oral, resp. rate 16, height 6' (1.829 m), weight 165 lb (74.8 kg), SpO2 99 %. Gen: Alert, well appearing.  Patient is oriented to person, place, time, and situation. CV: RRR, 4/6 harsh systolic murmur.  No r/g.   LUNGS: CTA  bilat, nonlabored resps, good aeration in all lung fields. EXT: no clubbing, cyanosis, or edema.    LABS:  Lab Results  Component Value Date   TSH 2.587 07/28/2015   Lab Results  Component Value Date   WBC 2.0 (L) 03/16/2016   HGB 14.8 03/16/2016   HCT 42.2 03/16/2016   MCV 82 03/16/2016   PLT 176 03/16/2016   Lab Results  Component Value Date   CREATININE 2.4 (H) 03/17/2016   BUN 44 (H) 03/17/2016   NA 141 03/17/2016   K 5.2 No Visible Hemolysis (H) 03/17/2016   CL 112 (H) 03/17/2016   CO2 21 03/17/2016   Lab Results  Component Value Date   ALT 17 03/16/2016   AST 20 03/16/2016   ALKPHOS 89 03/16/2016   BILITOT 0.93 03/16/2016   Lab Results  Component Value Date   CHOL 102 12/18/2015   Lab Results  Component Value Date   HDL 30.40 (L) 12/18/2015   Lab Results  Component Value Date   LDLCALC 54 12/18/2015   Lab Results  Component Value Date   TRIG 87.0 12/18/2015   Lab Results  Component Value Date   CHOLHDL 3 12/18/2015    IMPRESSION AND PLAN:  1) HTN; The current medical regimen is effective;  continue present plan and medications. Check lytes/cr today. If potassium going up then we may have to cut ACE-I dose in half or stop this med altogether.  2) HLD: tolerating Zetia.  Cholesterol panel 12/2015 looked good.  3) CRI stage III, with recent hyperkalemia 02/2016: recheck BMET today.  His GFR has been in the low 30s. Pt has been resistant to seeing a nephrologist in the past but I think this is because he thought this meant automatic dialysis.  We discussed possible referral again today and he is agreeable to seeing nephrologist if GFR remains around 30 ml/min (or worse) and/or if hyperkalemia remains an issue.  An After Visit Summary was printed and given to the patient.  FOLLOW UP: Return in about 3 months (around 07/19/2016) for annual CPE (fasting).  Signed:  Crissie Sickles, MD           04/20/2016

## 2016-04-20 NOTE — Progress Notes (Signed)
Pre visit review using our clinic review tool, if applicable. No additional management support is needed unless otherwise documented below in the visit note. 

## 2016-04-21 ENCOUNTER — Other Ambulatory Visit: Payer: Self-pay | Admitting: Family Medicine

## 2016-04-21 DIAGNOSIS — N184 Chronic kidney disease, stage 4 (severe): Secondary | ICD-10-CM

## 2016-04-21 DIAGNOSIS — E875 Hyperkalemia: Secondary | ICD-10-CM

## 2016-04-23 NOTE — Progress Notes (Signed)
AWV reviewed and agree.  Signed:  Crissie Sickles, MD           04/23/2016

## 2016-05-06 ENCOUNTER — Other Ambulatory Visit: Payer: Self-pay | Admitting: Family Medicine

## 2016-05-06 MED ORDER — EZETIMIBE 10 MG PO TABS: 10.0000 mg | ORAL_TABLET | Freq: Every day | ORAL | 1 refills | Status: DC

## 2016-05-06 MED ORDER — METOPROLOL SUCCINATE ER 50 MG PO TB24: 50.0000 mg | ORAL_TABLET | Freq: Every day | ORAL | 1 refills | Status: DC

## 2016-05-06 MED ORDER — TAMSULOSIN HCL 0.4 MG PO CAPS: 0.4000 mg | ORAL_CAPSULE | Freq: Every day | ORAL | 1 refills | Status: DC

## 2016-05-29 ENCOUNTER — Encounter: Payer: Self-pay | Admitting: Family Medicine

## 2016-06-03 ENCOUNTER — Other Ambulatory Visit: Payer: Self-pay | Admitting: Nephrology

## 2016-06-03 DIAGNOSIS — N184 Chronic kidney disease, stage 4 (severe): Secondary | ICD-10-CM

## 2016-06-03 DIAGNOSIS — I10 Essential (primary) hypertension: Secondary | ICD-10-CM

## 2016-06-17 ENCOUNTER — Ambulatory Visit: Payer: Medicare Other | Admitting: Hematology & Oncology

## 2016-06-17 ENCOUNTER — Other Ambulatory Visit: Payer: Medicare Other

## 2016-06-24 ENCOUNTER — Ambulatory Visit
Admission: RE | Admit: 2016-06-24 | Discharge: 2016-06-24 | Disposition: A | Discharge status: Home / Self Care | Payer: Medicare Other | Source: Ambulatory Visit | Attending: Nephrology | Admitting: Nephrology

## 2016-06-24 DIAGNOSIS — N184 Chronic kidney disease, stage 4 (severe): Secondary | ICD-10-CM

## 2016-06-24 DIAGNOSIS — I10 Essential (primary) hypertension: Secondary | ICD-10-CM

## 2016-06-27 ENCOUNTER — Encounter: Payer: Self-pay | Admitting: Family Medicine

## 2016-07-17 DIAGNOSIS — Z8639 Personal history of other endocrine, nutritional and metabolic disease: Secondary | ICD-10-CM

## 2016-07-17 HISTORY — DX: Personal history of other endocrine, nutritional and metabolic disease: Z86.39

## 2016-07-19 ENCOUNTER — Telehealth: Payer: Self-pay | Admitting: Family Medicine

## 2016-07-19 ENCOUNTER — Ambulatory Visit (INDEPENDENT_AMBULATORY_CARE_PROVIDER_SITE_OTHER): Payer: Medicare Other | Admitting: Family Medicine

## 2016-07-19 ENCOUNTER — Encounter: Payer: Self-pay | Admitting: Family Medicine

## 2016-07-19 VITALS — BP 157/70 | HR 69 | Temp 97.4°F | Resp 16 | Ht 71.0 in | Wt 164.5 lb

## 2016-07-19 DIAGNOSIS — E039 Hypothyroidism, unspecified: Secondary | ICD-10-CM | POA: Diagnosis not present

## 2016-07-19 DIAGNOSIS — Z0000 Encounter for general adult medical examination without abnormal findings: Secondary | ICD-10-CM

## 2016-07-19 DIAGNOSIS — Z7901 Long term (current) use of anticoagulants: Secondary | ICD-10-CM | POA: Diagnosis not present

## 2016-07-19 DIAGNOSIS — N184 Chronic kidney disease, stage 4 (severe): Secondary | ICD-10-CM | POA: Diagnosis not present

## 2016-07-19 DIAGNOSIS — E038 Other specified hypothyroidism: Secondary | ICD-10-CM

## 2016-07-19 LAB — BASIC METABOLIC PANEL
BUN: 36 mg/dL — ABNORMAL HIGH (ref 6–23)
CO2: 28 mEq/L (ref 19–32)
Calcium: 10 mg/dL (ref 8.4–10.5)
Chloride: 106 mEq/L (ref 96–112)
Creatinine, Ser: 2.44 mg/dL — ABNORMAL HIGH (ref 0.40–1.50)
GFR: 27.06 mL/min — ABNORMAL LOW (ref >60.00)
Glucose, Bld: 116 mg/dL — ABNORMAL HIGH (ref 70–99)
Potassium: 5.3 mEq/L — ABNORMAL HIGH (ref 3.5–5.1)
Sodium: 139 mEq/L (ref 135–145)

## 2016-07-19 LAB — CBC WITH DIFFERENTIAL/PLATELET
Basophils Absolute: 0.1 10*3/uL (ref 0.0–0.1)
Basophils Relative: 5.2 % — ABNORMAL HIGH (ref 0.0–3.0)
Eosinophils Absolute: 0.1 10*3/uL (ref 0.0–0.7)
Eosinophils Relative: 5.9 % — ABNORMAL HIGH (ref 0.0–5.0)
HCT: 47.1 % (ref 39.0–52.0)
Hemoglobin: 16.2 g/dL (ref 13.0–17.0)
Lymphocytes Relative: 48 % — ABNORMAL HIGH (ref 12.0–46.0)
Lymphs Abs: 0.8 10*3/uL (ref 0.7–4.0)
MCHC: 34.4 g/dL (ref 30.0–36.0)
MCV: 86 fl (ref 78.0–100.0)
Monocytes Absolute: 0.4 10*3/uL (ref 0.1–1.0)
Monocytes Relative: 21.5 % — ABNORMAL HIGH (ref 3.0–12.0)
Neutro Abs: 0.3 10*3/uL — ABNORMAL LOW (ref 1.4–7.7)
Neutrophils Relative %: 19.4 % — ABNORMAL LOW (ref 43.0–77.0)
Platelets: 144 10*3/uL — ABNORMAL LOW (ref 150.0–400.0)
RBC: 5.48 Mil/uL (ref 4.22–5.81)
RDW: 13.8 % (ref 11.5–15.5)
WBC: 1.7 10*3/uL — CL (ref 4.0–10.5)

## 2016-07-19 LAB — TSH: TSH: 3.82 u[IU]/mL (ref 0.35–4.50)

## 2016-07-19 NOTE — Progress Notes (Signed)
Pre visit review using our clinic review tool, if applicable. No additional management support is needed unless otherwise documented below in the visit note. 

## 2016-07-19 NOTE — Telephone Encounter (Signed)
Noted Low WBC (1.7), in review of EMR this is close to pts baseline. Will defer to PCP to follow given this is not a new issue for this patient. Thanks.

## 2016-07-19 NOTE — Telephone Encounter (Signed)
Lori from [place] lab called to report critic WBC 1.7.  Please advise.

## 2016-07-19 NOTE — Progress Notes (Signed)
Office Note 07/19/2016  CC:  Chief Complaint  Patient presents with  . Annual Exam    Pt is fasting.     HPI:  Richard Sullivan is a 83 y.o. White male who is here for annual health maintenance exam.  BP at home: 120s-130s over 70-75.  HR 85/min per pt (?).  No eye exam in last few years.  Denies vision c/o. No hearing complaints. He has dentures.  No acute complaints.  He push mows his yard, does other strenuous labor in yard and denies any SOB, CP, dizziness, or palpitations.   Past Medical History:  Diagnosis Date  . Atrial flutter ([facility]) 10/2014   started on low dose eliquis by cardiologist  . BPH (benign prostatic hyperplasia)   . Chronic leukopenia    Neutropenia, monocytosis also with chronic thrombocytopenia--Dr. Marin Olp following; stable as of 03/16/16 f/u.  Marland Kitchen Chronic renal insufficiency, stage 4 (severe) (HCC)    CrCl@30  ml/min.  ?nephrotic syndrome in 52s?--no old records.  Pt declined nephrology referral as of 2017; agreed to referral 2018 and got seen by Dr. Deterding---checking renal artery dopplers and renal u/s.  Marland Kitchen Colon polyps    Colonoscopy x 3 per pt--initial one with polyp but two 5 yr f/u colonoscopies normal per pt.  . Depression   . Gout    Doing ok since off allopurinol 2015  . H/O exercise stress test    about 5 yrs. ago, saw cardiologist, had normal stress test, told NO need  to f/u /w cardiologist   . History of anemia   . Hyperlipidemia   . Hypertension    takes meds daily  . Microhematuria    Renal u/s 08/2013 remarkable only for small nonobstructing stone in each kidney.  Urol feels like this is likely the source of the microhem, cystoscopy likely low yield--following stones with q31mo ultrasounds and office f/u ([facility])  . Severe aortic stenosis    cardiologist recommended TAVR 09/2015 and again 01/2016--pt declines this procedure b/c he is asymptomatic.  . Subclinical hypothyroidism 06/2014   TSH 5.2, normal T4 and T3.  Marland Kitchen UTI  (urinary tract infection) 02/2016   MSSA    Past Surgical History:  Procedure Laterality Date  . COLONOSCOPY    . EYE SURGERY     cataract - right  . INGUINAL HERNIA REPAIR  12/27/2011   Procedure: HERNIA REPAIR INGUINAL ADULT;  Surgeon: Odis Hollingshead, MD;  Location: [facility];  Service: General;  Laterality: Right;  . PROSTATE SURGERY     TURP 1980s/90s.--HELPED.  . TONSILLECTOMY    . TRANSTHORACIC ECHOCARDIOGRAM  07/22/14; 09/2015   EF 55-60%, AS worse than prior echo: mean gradient 44, peak >100: f/u 3 mo per cardiologist.  2017 f/u echo showed progression of AS to severe-to-critical.    Family History  Problem Relation Age of Onset  . Arthritis Mother   . Hypertension Father     Social History   Social History  . Marital status: Widowed    Spouse name: N/A  . Number of children: 1  . Years of education: N/A   Occupational History  . Retired    Social History Main Topics  . Smoking status: Former Smoker    Packs/day: 1.00    Years: 32.00    Types: Cigarettes    Quit date: 12/05/1982  . Smokeless tobacco: Never Used  . Alcohol use No  . Drug use: No  . Sexual activity: Not on file   Other Topics Concern  .  Not on file   Social History Narrative   Widower as of 08/2015, has one son.   Orig from [place], [place].   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   [employer] education.   Tob 30 pack-yr hx, quit 1984.   Alcohol-none    Drugso-none   Likes to hunt and fish.  Still mows yard with push mower.    Outpatient Medications Prior to Visit  Medication Sig Dispense Refill  . amitriptyline (ELAVIL) 50 MG tablet Take 1 tablet (50 mg total) by mouth at bedtime. 90 tablet 3  . apixaban (ELIQUIS) 2.5 MG TABS tablet Take 1 tablet (2.5 mg total) by mouth 2 (two) times daily. 60 tablet 9  . ezetimibe (ZETIA) 10 MG tablet Take 1 tablet (10 mg total) by mouth daily. 90 tablet 1  . lisinopril (PRINIVIL,ZESTRIL) 10 MG tablet Take 1 tablet (10 mg total) by mouth daily. 30 tablet 11  .  metoprolol succinate (TOPROL-XL) 50 MG 24 hr tablet Take 1 tablet (50 mg total) by mouth daily. 90 tablet 1  . tamsulosin (FLOMAX) 0.4 MG CAPS capsule Take 1 capsule (0.4 mg total) by mouth daily. 90 capsule 1   No facility-administered medications prior to visit.     No Known Allergies  ROS Review of Systems  Constitutional: Negative for appetite change, chills, fatigue and fever.  HENT: Negative for congestion, dental problem, ear pain and sore throat.   Eyes: Negative for discharge, redness and visual disturbance.  Respiratory: Negative for cough, chest tightness, shortness of breath and wheezing.   Cardiovascular: Negative for chest pain, palpitations and leg swelling.  Gastrointestinal: Negative for abdominal pain, blood in stool, diarrhea, nausea and vomiting.  Genitourinary: Negative for difficulty urinating, dysuria, flank pain, frequency, hematuria and urgency.  Musculoskeletal: Negative for arthralgias, back pain, joint swelling, myalgias and neck stiffness.  Skin: Negative for pallor and rash.  Neurological: Negative for dizziness, speech difficulty, weakness and headaches.  Hematological: Negative for adenopathy. Does not bruise/bleed easily.  Psychiatric/Behavioral: Negative for confusion and sleep disturbance. The patient is not nervous/anxious.     PE; Blood pressure (!) 157/70, pulse 69, temperature 97.4 F (36.3 C), temperature source Oral, resp. rate 16, height 5\' 11"  (1.803 m), weight 164 lb 8 oz (74.6 kg), SpO2 97 %. Body mass index is 22.94 kg/m.  Gen: Alert, well appearing.  Patient is oriented to person, place, time, and situation. AFFECT: pleasant, lucid thought and speech. ENT: Ears: EACs clear, normal epithelium.  TMs with good light reflex and landmarks bilaterally.  Eyes: no injection, icteris, swelling, or exudate.  EOMI, PERRLA. Nose: no drainage or turbinate edema/swelling.  No injection or focal lesion.  Mouth: lips without lesion/swelling.  Oral mucosa  pink and moist.  Dentures present.  Oropharynx without erythema, exudate, or swelling.  Neck: supple/nontender.  No LAD, mass, or TM.  Carotid pulses 2+ bilaterally, without bruits. CV: RRR, 3/6 harsh systolic murmur, S1 and S2 obscured, no diastolic murmur.  No r/g.   LUNGS: CTA bilat, nonlabored resps, good aeration in all lung fields. ABD: soft, NT, ND, BS normal.  No hepatospenomegaly or mass.  No bruits. EXT: no clubbing, cyanosis, or edema.  Musculoskeletal: no joint swelling, erythema, warmth, or tenderness.  ROM of all joints intact. Skin - no sores or suspicious lesions or rashes or color changes   Pertinent labs:  Lab Results  Component Value Date   TSH 2.587 07/28/2015   Lab Results  Component Value Date   WBC 2.0 (L) 03/16/2016  HGB 14.8 03/16/2016   HCT 42.2 03/16/2016   MCV 82 03/16/2016   PLT 176 03/16/2016   Lab Results  Component Value Date   CREATININE 2.25 (H) 04/20/2016   BUN 35 (H) 04/20/2016   NA 138 04/20/2016   K 5.1 04/20/2016   CL 106 04/20/2016   CO2 26 04/20/2016   Lab Results  Component Value Date   ALT 17 03/16/2016   AST 20 03/16/2016   ALKPHOS 89 03/16/2016   BILITOT 0.93 03/16/2016   Lab Results  Component Value Date   CHOL 102 12/18/2015   Lab Results  Component Value Date   HDL 30.40 (L) 12/18/2015   Lab Results  Component Value Date   LDLCALC 54 12/18/2015   Lab Results  Component Value Date   TRIG 87.0 12/18/2015   Lab Results  Component Value Date   CHOLHDL 3 12/18/2015    ASSESSMENT AND PLAN:   Health maintenance exam: Reviewed age and gender appropriate health maintenance issues (prudent diet, regular exercise, health risks of tobacco and excessive alcohol, use of seatbelts, fire alarms in home, use of sunscreen).  Also reviewed age and gender appropriate health screening as well as vaccine recommendations. Vaccines UTD. No longer desires colon cancer screening or prostate cancer screening and I definitely agree  with this. Labs today: BMET--f/u CRI stage 4 with hx of hyperkalemia.  TSH--hx of subclinical hypothyroidism.  CBC: pt on chronic anticoagulation therapy. He will continue routine f/u with cardiology for a fib and moderate to severe aortic stenosis. He will call his nephrologist to determine f/u plan.  An After Visit Summary was printed and given to the patient.  FOLLOW UP:  Return in about 6 months (around 01/18/2017) for routine chronic illness f/u.  Signed:  Crissie Sickles, MD           07/19/2016

## 2016-07-22 NOTE — Telephone Encounter (Signed)
Noted  

## 2016-07-28 ENCOUNTER — Other Ambulatory Visit (INDEPENDENT_AMBULATORY_CARE_PROVIDER_SITE_OTHER): Payer: Medicare Other

## 2016-07-28 DIAGNOSIS — E875 Hyperkalemia: Secondary | ICD-10-CM

## 2016-07-28 DIAGNOSIS — N289 Disorder of kidney and ureter, unspecified: Secondary | ICD-10-CM

## 2016-07-28 LAB — BASIC METABOLIC PANEL
BUN: 37 mg/dL — ABNORMAL HIGH (ref 6–23)
CO2: 27 mEq/L (ref 19–32)
Calcium: 9.7 mg/dL (ref 8.4–10.5)
Chloride: 104 mEq/L (ref 96–112)
Creatinine, Ser: 2.36 mg/dL — ABNORMAL HIGH (ref 0.40–1.50)
GFR: 28.12 mL/min — ABNORMAL LOW (ref >60.00)
Glucose, Bld: 102 mg/dL — ABNORMAL HIGH (ref 70–99)
Potassium: 5.6 mEq/L — ABNORMAL HIGH (ref 3.5–5.1)
Sodium: 135 mEq/L (ref 135–145)

## 2016-08-02 ENCOUNTER — Other Ambulatory Visit: Payer: Self-pay | Admitting: *Deleted

## 2016-08-02 DIAGNOSIS — E875 Hyperkalemia: Secondary | ICD-10-CM | POA: Insufficient documentation

## 2016-08-10 ENCOUNTER — Other Ambulatory Visit (INDEPENDENT_AMBULATORY_CARE_PROVIDER_SITE_OTHER): Payer: Medicare Other

## 2016-08-10 ENCOUNTER — Encounter: Payer: Self-pay | Admitting: Family Medicine

## 2016-08-10 DIAGNOSIS — E875 Hyperkalemia: Secondary | ICD-10-CM | POA: Diagnosis not present

## 2016-08-10 LAB — BASIC METABOLIC PANEL
BUN: 34 mg/dL — ABNORMAL HIGH (ref 6–23)
CO2: 25 mEq/L (ref 19–32)
Calcium: 9.9 mg/dL (ref 8.4–10.5)
Chloride: 106 mEq/L (ref 96–112)
Creatinine, Ser: 2.35 mg/dL — ABNORMAL HIGH (ref 0.40–1.50)
GFR: 28.25 mL/min — ABNORMAL LOW (ref >60.00)
Glucose, Bld: 101 mg/dL — ABNORMAL HIGH (ref 70–99)
Potassium: 4.8 mEq/L (ref 3.5–5.1)
Sodium: 136 mEq/L (ref 135–145)

## 2016-09-02 ENCOUNTER — Other Ambulatory Visit: Payer: Self-pay | Admitting: *Deleted

## 2016-09-02 MED ORDER — APIXABAN 2.5 MG PO TABS: 2.5000 mg | ORAL_TABLET | Freq: Two times a day (BID) | ORAL | 1 refills | Status: DC

## 2016-10-08 ENCOUNTER — Other Ambulatory Visit: Payer: Self-pay | Admitting: Family Medicine

## 2016-10-13 ENCOUNTER — Encounter: Payer: Self-pay | Admitting: Family Medicine

## 2016-10-30 ENCOUNTER — Other Ambulatory Visit: Payer: Self-pay | Admitting: Family Medicine

## 2016-10-31 ENCOUNTER — Other Ambulatory Visit: Payer: Self-pay | Admitting: *Deleted

## 2016-10-31 MED ORDER — AMITRIPTYLINE HCL 50 MG PO TABS: 50.0000 mg | ORAL_TABLET | Freq: Every day | ORAL | 3 refills | Status: DC

## 2016-10-31 NOTE — Telephone Encounter (Signed)
[facility] [address]  RF request for amitriptyline LOV: 07/19/16 Next ov: 01/18/17 Last written: 11/09/15 #90 w/ 3RF  Please advise. Thanks.

## 2017-01-18 ENCOUNTER — Telehealth: Payer: Self-pay | Admitting: Family Medicine

## 2017-01-18 ENCOUNTER — Ambulatory Visit (INDEPENDENT_AMBULATORY_CARE_PROVIDER_SITE_OTHER): Payer: Medicare Other | Admitting: Family Medicine

## 2017-01-18 ENCOUNTER — Encounter: Payer: Self-pay | Admitting: Family Medicine

## 2017-01-18 VITALS — BP 144/62 | HR 59 | Temp 98.0°F | Resp 16 | Ht 71.0 in | Wt 169.2 lb

## 2017-01-18 DIAGNOSIS — D72819 Decreased white blood cell count, unspecified: Secondary | ICD-10-CM | POA: Diagnosis not present

## 2017-01-18 DIAGNOSIS — I10 Essential (primary) hypertension: Secondary | ICD-10-CM

## 2017-01-18 DIAGNOSIS — N184 Chronic kidney disease, stage 4 (severe): Secondary | ICD-10-CM

## 2017-01-18 DIAGNOSIS — Z23 Encounter for immunization: Secondary | ICD-10-CM | POA: Diagnosis not present

## 2017-01-18 DIAGNOSIS — E7800 Pure hypercholesterolemia, unspecified: Secondary | ICD-10-CM | POA: Diagnosis not present

## 2017-01-18 DIAGNOSIS — E875 Hyperkalemia: Secondary | ICD-10-CM

## 2017-01-18 LAB — LIPID PANEL
Cholesterol: 109 mg/dL (ref 0–200)
HDL: 31.5 mg/dL — ABNORMAL LOW (ref >39.00)
LDL Cholesterol: 64 mg/dL (ref 0–99)
NonHDL: 77.57
Total CHOL/HDL Ratio: 3
Triglycerides: 68 mg/dL (ref 0.0–149.0)
VLDL: 13.6 mg/dL (ref 0.0–40.0)

## 2017-01-18 LAB — CBC WITH DIFFERENTIAL/PLATELET
Basophils Absolute: 0.1 10*3/uL (ref 0.0–0.1)
Basophils Relative: 5.3 % — ABNORMAL HIGH (ref 0.0–3.0)
Eosinophils Absolute: 0.1 10*3/uL (ref 0.0–0.7)
Eosinophils Relative: 5.7 % — ABNORMAL HIGH (ref 0.0–5.0)
HCT: 44.2 % (ref 39.0–52.0)
Hemoglobin: 15.2 g/dL (ref 13.0–17.0)
Lymphocytes Relative: 54.5 % — ABNORMAL HIGH (ref 12.0–46.0)
Lymphs Abs: 0.8 10*3/uL (ref 0.7–4.0)
MCHC: 34.3 g/dL (ref 30.0–36.0)
MCV: 90.2 fl (ref 78.0–100.0)
Monocytes Absolute: 0.4 10*3/uL (ref 0.1–1.0)
Monocytes Relative: 25.8 % — ABNORMAL HIGH (ref 3.0–12.0)
Neutro Abs: 0.1 10*3/uL — ABNORMAL LOW (ref 1.4–7.7)
Neutrophils Relative %: 8.7 % — ABNORMAL LOW (ref 43.0–77.0)
Platelets: 136 10*3/uL — ABNORMAL LOW (ref 150.0–400.0)
RBC: 4.9 Mil/uL (ref 4.22–5.81)
RDW: 14.7 % (ref 11.5–15.5)
WBC: 1.5 10*3/uL — CL (ref 4.0–10.5)

## 2017-01-18 LAB — COMPREHENSIVE METABOLIC PANEL
ALT: 11 U/L (ref 0–53)
AST: 16 U/L (ref 0–37)
Albumin: 3.7 g/dL (ref 3.5–5.2)
Alkaline Phosphatase: 76 U/L (ref 39–117)
BUN: 36 mg/dL — ABNORMAL HIGH (ref 6–23)
CO2: 26 mEq/L (ref 19–32)
Calcium: 10 mg/dL (ref 8.4–10.5)
Chloride: 104 mEq/L (ref 96–112)
Creatinine, Ser: 2.56 mg/dL — ABNORMAL HIGH (ref 0.40–1.50)
GFR: 25.57 mL/min — ABNORMAL LOW (ref >60.00)
Glucose, Bld: 101 mg/dL — ABNORMAL HIGH (ref 70–99)
Potassium: 5.8 mEq/L — ABNORMAL HIGH (ref 3.5–5.1)
Sodium: 136 mEq/L (ref 135–145)
Total Bilirubin: 1.4 mg/dL — ABNORMAL HIGH (ref 0.2–1.2)
Total Protein: 6.8 g/dL (ref 6.0–8.3)

## 2017-01-18 MED ORDER — SODIUM POLYSTYRENE SULFONATE 15 GM/60ML PO SUSP: ORAL | 0 refills | Status: DC

## 2017-01-18 NOTE — Progress Notes (Signed)
OFFICE VISIT  01/18/2017   CC:  Chief Complaint  Patient presents with  . Follow-up    RCI, pt is fasting.     HPI:    Patient is a 83 y.o. Caucasian male who presents for f/u CRI stage 4 w/hx of hyperkalemia, HTN, HLD. He has severe aortic stenosis that has been asymptomatic so he has elected not to get any procedure for this.  CRI: Dr. Jimmy Footman recently took him off his lisinopril. Pt thinks this was b/c of persistently high potassium A recheck of K after getting off this med was improved per pt report today.  HTN: home bp monitoring consistently 130s/70.  HR 60-70.  HLD: compliant with zetia, no side effects.  ROS: no SOB, no CP, no DOE.  He says he can push mow his yard w/out any physical symptoms. No fevers, no LE swelling, no lymph node swelling noted by pt.  No cough or URI sx's.    Past Medical History:  Diagnosis Date  . Anemia of chronic renal failure   . Atrial flutter ([facility]) 10/2014   started on low dose eliquis by cardiologist  . BPH (benign prostatic hyperplasia)   . Chronic leukopenia    Neutropenia, monocytosis also with chronic thrombocytopenia--Dr. Marin Olp following; stable as of 03/16/16 f/u.  Marland Kitchen Chronic renal insufficiency, stage 4 (severe) (HCC)    GFR 40ml/min as of 10/11/16 neph f/u.  ?nephrotic syndrome in 100s?--no old records. Dr. Jimmy Footman, 2018, renal u/s- medical renal dz/Nephrosclerosis per renal (no RAS).  . Colon polyps    Colonoscopy x 3 per pt--initial one with polyp but two 5 yr f/u colonoscopies normal per pt.  . Depression   . Gout    Doing ok since off allopurinol 2015  . H/O exercise stress test    about 5 yrs. ago, saw cardiologist, had normal stress test, told NO need  to f/u /w cardiologist   . History of hyperkalemia 07/2016   cutting ACE-I dose in half helped this normalize.  +Recurrence 09/2016 (K 5.7 on neph f/u labs)  . Hyperlipidemia   . Hypertension    takes meds daily  . Microhematuria    Renal u/s 08/2013 remarkable only  for small nonobstructing stone in each kidney.  Urol feels like this is likely the source of the microhem, cystoscopy likely low yield--following stones with q2mo ultrasounds and office f/u ([facility])  . Severe aortic stenosis    cardiologist recommended TAVR 09/2015 and again 01/2016--pt declines this procedure b/c he is asymptomatic.  . Subclinical hypothyroidism 06/2014   TSH 5.2, normal T4 and T3.  Marland Kitchen UTI (urinary tract infection) 02/2016   MSSA    Past Surgical History:  Procedure Laterality Date  . COLONOSCOPY    . EYE SURGERY     cataract - right  . INGUINAL HERNIA REPAIR  12/27/2011   Procedure: HERNIA REPAIR INGUINAL ADULT;  Surgeon: Odis Hollingshead, MD;  Location: [facility];  Service: General;  Laterality: Right;  . PROSTATE SURGERY     TURP 1980s/90s.--HELPED.  . TONSILLECTOMY    . TRANSTHORACIC ECHOCARDIOGRAM  07/22/14; 09/2015   EF 55-60%, AS worse than prior echo: mean gradient 44, peak >100: f/u 3 mo per cardiologist.  2017 f/u echo showed progression of AS to severe-to-critical.    Outpatient Medications Prior to Visit  Medication Sig Dispense Refill  . amitriptyline (ELAVIL) 50 MG tablet Take 1 tablet (50 mg total) by mouth at bedtime. 90 tablet 3  . apixaban (ELIQUIS) 2.5 MG TABS  tablet Take 1 tablet (2.5 mg total) by mouth 2 (two) times daily. 180 tablet 1  . ezetimibe (ZETIA) 10 MG tablet TAKE 1 TABLET (10 MG TOTAL) BY MOUTH DAILY. 90 tablet 0  . metoprolol succinate (TOPROL-XL) 50 MG 24 hr tablet TAKE 1 TABLET (50 MG TOTAL) BY MOUTH DAILY. 90 tablet 0  . tamsulosin (FLOMAX) 0.4 MG CAPS capsule TAKE 1 CAPSULE (0.4 MG TOTAL) BY MOUTH DAILY. 90 capsule 0  . lisinopril (PRINIVIL,ZESTRIL) 10 MG tablet TAKE 1 TABLET (10 MG TOTAL) BY MOUTH DAILY. (Patient not taking: Reported on 01/18/2017) 30 tablet 7   No facility-administered medications prior to visit.     No Known Allergies  ROS As per HPI  PE: Blood pressure (!) 144/62, pulse (!) 59, temperature 98 F (36.7 C),  temperature source Oral, resp. rate 16, height 5\' 11"  (1.803 m), weight 169 lb 4 oz (76.8 kg), SpO2 100 %. Gen: Alert, well appearing.  Patient is oriented to person, place, time, and situation. AFFECT: pleasant, lucid thought and speech. CV: irreg rhythm, rate about low 60s, 6-6/5 harsh systolic murmur, S1 and S2 obscured.  No diastolic murmur, no rub, no gallop. Chest is clear, no wheezing or rales. Normal symmetric air entry throughout both lung fields. No chest wall deformities or tenderness. EXT: no clubbing, cyanosis, or edema.    LABS:  Lab Results  Component Value Date   TSH 3.82 07/19/2016   Lab Results  Component Value Date   WBC 1.7 Repeated and verified X2. (LL) 07/19/2016   HGB 16.2 07/19/2016   HCT 47.1 07/19/2016   MCV 86.0 07/19/2016   PLT 144.0 (L) 07/19/2016   Lab Results  Component Value Date   CREATININE 2.35 (H) 08/10/2016   BUN 34 (H) 08/10/2016   NA 136 08/10/2016   K 4.8 08/10/2016   CL 106 08/10/2016   CO2 25 08/10/2016   Lab Results  Component Value Date   ALT 17 03/16/2016   AST 20 03/16/2016   ALKPHOS 89 03/16/2016   BILITOT 0.93 03/16/2016   Lab Results  Component Value Date   CHOL 102 12/18/2015   Lab Results  Component Value Date   HDL 30.40 (L) 12/18/2015   Lab Results  Component Value Date   LDLCALC 54 12/18/2015   Lab Results  Component Value Date   TRIG 87.0 12/18/2015   Lab Results  Component Value Date   CHOLHDL 3 12/18/2015    IMPRESSION AND PLAN:  1) HTN; The current medical regimen is effective;  continue present plan and medications. Lytes/cr today.  2) HLD: The current medical regimen is effective;  continue present plan and medications. FLP and AST/ALT today.  3) CRI stage 4, hyperkalemia: improved K off of ACE-I (nephrologist). Recheck BMET today.  4) Chronic leukopenia/thrombocytopenia: followed by specialist. Recheck CBC today.  5) severe aortic stenosis: asymptomatic.  Pt elects to NOT get any  procedure for this.  6) Afib-flutter: rate control with metoprolol, anticoag with eliquis low dose--per cardiology.  An After Visit Summary was printed and given to the patient.  FOLLOW UP: Return in about 6 months (around 07/19/2017) for routine chronic illness f/u.  Signed:  Crissie Sickles, MD           01/18/2017

## 2017-01-18 NOTE — Telephone Encounter (Signed)
I called pt and discussed high potassium result with him. Told him I wanted him to start kayexalate three days per week indefinitely, I sent eRx. I told him to come in for lab check in 1 week.   Pls call him and get him on lab schedule for somewhere around 01/25/17.  BMET ordered.-thx

## 2017-01-19 NOTE — Telephone Encounter (Signed)
Tried calling pt, NA and unable to leave a message.

## 2017-01-24 ENCOUNTER — Other Ambulatory Visit (INDEPENDENT_AMBULATORY_CARE_PROVIDER_SITE_OTHER): Payer: Medicare Other

## 2017-01-24 DIAGNOSIS — E875 Hyperkalemia: Secondary | ICD-10-CM

## 2017-01-24 LAB — BASIC METABOLIC PANEL
BUN: 33 mg/dL — ABNORMAL HIGH (ref 6–23)
CO2: 27 mEq/L (ref 19–32)
Calcium: 9.8 mg/dL (ref 8.4–10.5)
Chloride: 105 mEq/L (ref 96–112)
Creatinine, Ser: 2.32 mg/dL — ABNORMAL HIGH (ref 0.40–1.50)
GFR: 28.64 mL/min — ABNORMAL LOW (ref >60.00)
Glucose, Bld: 103 mg/dL — ABNORMAL HIGH (ref 70–99)
Potassium: 5.2 mEq/L — ABNORMAL HIGH (ref 3.5–5.1)
Sodium: 136 mEq/L (ref 135–145)

## 2017-01-24 NOTE — Telephone Encounter (Signed)
SW pt last week, lab apt made for 01/24/17, okay per Dr. Anitra Lauth.

## 2017-01-25 ENCOUNTER — Encounter: Payer: Self-pay | Admitting: Family Medicine

## 2017-01-25 ENCOUNTER — Other Ambulatory Visit: Payer: Self-pay | Admitting: *Deleted

## 2017-01-25 DIAGNOSIS — E875 Hyperkalemia: Secondary | ICD-10-CM

## 2017-01-29 ENCOUNTER — Other Ambulatory Visit: Payer: Self-pay | Admitting: Family Medicine

## 2017-02-15 ENCOUNTER — Other Ambulatory Visit (INDEPENDENT_AMBULATORY_CARE_PROVIDER_SITE_OTHER): Payer: Medicare Other

## 2017-02-15 ENCOUNTER — Encounter: Payer: Self-pay | Admitting: Family Medicine

## 2017-02-15 DIAGNOSIS — E875 Hyperkalemia: Secondary | ICD-10-CM

## 2017-02-15 LAB — BASIC METABOLIC PANEL
BUN: 39 mg/dL — ABNORMAL HIGH (ref 6–23)
CO2: 28 mEq/L (ref 19–32)
Calcium: 9.9 mg/dL (ref 8.4–10.5)
Chloride: 105 mEq/L (ref 96–112)
Creatinine, Ser: 2.16 mg/dL — ABNORMAL HIGH (ref 0.40–1.50)
GFR: 31.1 mL/min — ABNORMAL LOW (ref >60.00)
Glucose, Bld: 105 mg/dL — ABNORMAL HIGH (ref 70–99)
Potassium: 5.3 mEq/L — ABNORMAL HIGH (ref 3.5–5.1)
Sodium: 137 mEq/L (ref 135–145)

## 2017-02-16 ENCOUNTER — Encounter: Payer: Self-pay | Admitting: Family Medicine

## 2017-03-28 ENCOUNTER — Other Ambulatory Visit: Payer: Self-pay | Admitting: Cardiovascular Disease

## 2017-05-09 LAB — CBC AND DIFFERENTIAL
HCT: 46 (ref 41–53)
Hemoglobin: 16.1 (ref 13.5–17.5)
Platelets: 117 — AB (ref 150–399)
WBC: 1.5

## 2017-05-09 LAB — BASIC METABOLIC PANEL
BUN: 35 — AB (ref 4–21)
Creatinine: 2.5 — AB (ref 0.6–1.3)
Glucose: 114
Potassium: 5.1 (ref 3.4–5.3)
Sodium: 138 (ref 137–147)

## 2017-05-09 LAB — IRON,TIBC AND FERRITIN PANEL
Ferritin: 256
Iron: 104
TIBC: 293
UIBC: 189

## 2017-05-15 ENCOUNTER — Encounter: Payer: Self-pay | Admitting: Family Medicine

## 2017-05-17 ENCOUNTER — Encounter: Payer: Self-pay | Admitting: Family Medicine

## 2017-05-30 ENCOUNTER — Encounter: Payer: Self-pay | Admitting: Family Medicine

## 2017-05-30 ENCOUNTER — Ambulatory Visit: Payer: Medicare Other | Admitting: Family Medicine

## 2017-05-30 ENCOUNTER — Telehealth: Payer: Self-pay | Admitting: Family Medicine

## 2017-05-30 VITALS — BP 153/70 | HR 74 | Temp 97.9°F | Resp 20 | Wt 168.5 lb

## 2017-05-30 DIAGNOSIS — J320 Chronic maxillary sinusitis: Secondary | ICD-10-CM

## 2017-05-30 MED ORDER — CEFDINIR 300 MG PO CAPS: 300.0000 mg | ORAL_CAPSULE | Freq: Every day | ORAL | 0 refills | Status: DC

## 2017-05-30 MED ORDER — BENZONATATE 100 MG PO CAPS: 100.0000 mg | ORAL_CAPSULE | Freq: Two times a day (BID) | ORAL | 0 refills | Status: DC | PRN

## 2017-05-30 NOTE — Patient Instructions (Addendum)
Start Cefdinir once daily.  Tessalon perles (benzonatate) for cough.  Get Mucinex DM to help with cough and mucus production. This is over the counter.  Rest, hydrate.  nasal saline.  F/U 2 weeks of not improved.     Sinusitis, Adult Sinusitis is soreness and inflammation of your sinuses. Sinuses are hollow spaces in the bones around your face. They are located:  Around your eyes.  In the middle of your forehead.  Behind your nose.  In your cheekbones.  Your sinuses and nasal passages are lined with a stringy fluid (mucus). Mucus normally drains out of your sinuses. When your nasal tissues get inflamed or swollen, the mucus can get trapped or blocked so air cannot flow through your sinuses. This lets bacteria, viruses, and funguses grow, and that leads to infection. Follow these instructions at home: Medicines  Take, use, or apply over-the-counter and prescription medicines only as told by your doctor. These may include nasal sprays.  If you were prescribed an antibiotic medicine, take it as told by your doctor. Do not stop taking the antibiotic even if you start to feel better. Hydrate and Humidify  Drink enough water to keep your pee (urine) clear or pale yellow.  Use a cool mist humidifier to keep the humidity level in your home above 50%.  Breathe in steam for 10-15 minutes, 3-4 times a day or as told by your doctor. You can do this in the bathroom while a hot shower is running.  Try not to spend time in cool or dry air. Rest  Rest as much as possible.  Sleep with your head raised (elevated).  Make sure to get enough sleep each night. General instructions  Put a warm, moist washcloth on your face 3-4 times a day or as told by your doctor. This will help with discomfort.  Wash your hands often with soap and water. If there is no soap and water, use hand sanitizer.  Do not smoke. Avoid being around people who are smoking (secondhand smoke).  Keep all follow-up  visits as told by your doctor. This is important. Contact a doctor if:  You have a fever.  Your symptoms get worse.  Your symptoms do not get better within 10 days. Get help right away if:  You have a very bad headache.  You cannot stop throwing up (vomiting).  You have pain or swelling around your face or eyes.  You have trouble seeing.  You feel confused.  Your neck is stiff.  You have trouble breathing. This information is not intended to replace advice given to you by your health care provider. Make sure you discuss any questions you have with your health care provider. Document Released: 09/21/2007 Document Revised: 11/29/2015 Document Reviewed: 01/28/2015 Elsevier Interactive Patient Education  [employer].

## 2017-05-30 NOTE — Telephone Encounter (Signed)
Please advise. Thanks.  

## 2017-05-30 NOTE — Telephone Encounter (Signed)
Copied from [facility] [id number]. Topic: Quick Communication - Rx Refill/Question >> May 30, 2017 12:20 PM Arletha Grippe wrote: Medication: sodium polystyrene (KAYEXALATE) 15 GM/60ML suspension   Has the patient contacted their pharmacy? Yes.     (Agent: If no, request that the patient contact the pharmacy for the refill.)   Preferred Pharmacy (with phone number or street name): [facility] [place] - [phone] Pt did not have rx for this med and pharmacist is asking if this needs to be sent in. Pharmacist found KAIONEX in the computer and if this is the same medication, it will need to be ordered.  Please call for clarification     Agent: Please be advised that RX refills may take up to 3 business days. We ask that you follow-up with your pharmacy.

## 2017-05-30 NOTE — Telephone Encounter (Signed)
Pls call [facility] in [place] and see if Dr. Jimmy Footman has record of him taking this med. Also, ask their office to send Mr. Seabrooks most recent lab work results. We'll get back to Mr. Minerva about this med when I get this information.-thx

## 2017-05-30 NOTE — Progress Notes (Signed)
Richard Sullivan , [birth year], 84 y.o., male MRN: [id number] Patient Care Team    Relationship Specialty Notifications Start End  McGowen, Richard Blackwater, MD PCP - General Family Medicine  08/20/12   Richard Blanks, MD Consulting Physician Cardiology  09/28/13   Richard Napoleon, MD Consulting Physician Oncology  12/13/13   Richard Hughs, MD Attending Physician Urology  01/17/14   Richard Area, MD Consulting Physician Nephrology  05/29/16     Chief Complaint  Patient presents with  . URI    cough,congestion x 1 week     Subjective: Pt presents for an OV with complaints of nasal conhestion of > 1 week duration.  Associated symptoms include mild cough, mostly at night and can be productive at times. He endorses sinus pressure pain and headache. He denies fever, chills, nausea or vomit. He is making certain to drink plenty of water. He had a sinusitis and bronchitis a few years ago and this feel similar (but not as bad).   Depression screen [facility] 2/9 04/20/2016 09/19/2014  Decreased Interest 0 0  Down, Depressed, Hopeless 0 0  PHQ - 2 Score 0 0    No Known Allergies Social History   Tobacco Use  . Smoking status: Former Smoker    Packs/day: 1.00    Years: 32.00    Pack years: 32.00    Types: Cigarettes    Last attempt to quit: 12/05/1982    Years since quitting: 34.5  . Smokeless tobacco: Never Used  Substance Use Topics  . Alcohol use: No    Alcohol/week: 0.0 oz   Past Medical History:  Diagnosis Date  . Anemia of chronic renal failure   . Atrial flutter ([place]) 10/2014   started on low dose eliquis by cardiologist  . BPH (benign prostatic hyperplasia)   . Chronic leukopenia    Neutropenia, monocytosis also with chronic thrombocytopenia--Dr. Marin Sullivan following; stable as of 03/16/16 f/u.  Marland Kitchen Chronic renal insufficiency, stage 4 (severe) (HCC)    GFR stable at 21ml/min as of 04/2017 neph f/u.  ?nephrotic syndrome in 60s?--no old records. Dr. Jimmy Sullivan, 2018, renal  u/s- medical renal dz/Nephrosclerosis per renal (no RAS).  Cr 2.47/GFR 23 on 02/13/17 neph f/u.  K 5.5.  Marland Kitchen Colon polyps    Colonoscopy x 3 per pt--initial one with polyp but two 5 yr f/u colonoscopies normal per pt.  . Depression   . Gout    Doing ok since off allopurinol 2015  . H/O exercise stress test    about 5 yrs. ago, saw cardiologist, had normal stress test, told NO need  to f/u /w cardiologist   . History of hyperkalemia 07/2016   cutting ACE-I dose in half helped this normalize.  +Recurrence 09/2016 (K 5.7 on neph f/u labs).  Elevated again 01/2017--started pt on scheduled kayexalate.  . Hyperkalemia    Due to CKD stage 4 (off ACE-I)  . Hyperlipidemia   . Hypertension    takes meds daily  . Microhematuria    Renal u/s 08/2013 remarkable only for small nonobstructing stone in each kidney.  Urol feels like this is likely the source of the microhem, cystoscopy likely low yield--following stones with q13mo ultrasounds and office f/u ([facility])  . Severe aortic stenosis    cardiologist recommended TAVR 09/2015 and again 01/2016--pt declines this procedure b/c he is asymptomatic.  . Subclinical hypothyroidism 06/2014   TSH 5.2, normal T4 and T3.  Marland Kitchen UTI (urinary tract infection) 02/2016   MSSA  Past Surgical History:  Procedure Laterality Date  . COLONOSCOPY    . EYE SURGERY     cataract - right  . INGUINAL HERNIA REPAIR  12/27/2011   Procedure: HERNIA REPAIR INGUINAL ADULT;  Surgeon: Richard Hollingshead, MD;  Location: [facility];  Service: General;  Laterality: Right;  . PROSTATE SURGERY     TURP 1980s/90s.--HELPED.  . TONSILLECTOMY    . TRANSTHORACIC ECHOCARDIOGRAM  07/22/14; 09/2015   EF 55-60%, AS worse than prior echo: mean gradient 44, peak >100: f/u 3 mo per cardiologist.  2017 f/u echo showed progression of AS to severe-to-critical.   Family History  Problem Relation Age of Onset  . Arthritis Mother   . Hypertension Father    Allergies as of 05/30/2017   No Known Allergies      Medication List        Accurate as of 05/30/17 12:51 PM. Always use your most recent med list.          amitriptyline 50 MG tablet Commonly known as:  ELAVIL Take 1 tablet (50 mg total) by mouth at bedtime.   benzonatate 100 MG capsule Commonly known as:  TESSALON Take 1 capsule (100 mg total) by mouth 2 (two) times daily as needed for cough.   cefdinir 300 MG capsule Commonly known as:  OMNICEF Take 1 capsule (300 mg total) by mouth daily.   ELIQUIS 2.5 MG Tabs tablet Generic drug:  apixaban TAKE 1 TABLET BY MOUTH TWICE A DAY   ezetimibe 10 MG tablet Commonly known as:  ZETIA TAKE 1 TABLET (10 MG TOTAL) BY MOUTH DAILY.   metoprolol succinate 50 MG 24 hr tablet Commonly known as:  TOPROL-XL TAKE 1 TABLET (50 MG TOTAL) BY MOUTH DAILY.   sodium polystyrene 15 GM/60ML suspension Commonly known as:  KAYEXALATE Take 60 ml (15 g total) once a day on T/Th/Sat   tamsulosin 0.4 MG Caps capsule Commonly known as:  FLOMAX TAKE 1 CAPSULE (0.4 MG TOTAL) BY MOUTH DAILY.       All past medical history, surgical history, allergies, family history, immunizations andmedications were updated in the EMR today and reviewed under the history and medication portions of their EMR.     ROS: Negative, with the exception of above mentioned in HPI   Objective:  BP (!) 153/70 (BP Location: Right Arm, Patient Position: Sitting, Cuff Size: Normal)   Pulse 74   Temp 97.9 F (36.6 C)   Resp 20   Wt 168 lb 8 oz (76.4 kg)   SpO2 100%   BMI 23.50 kg/m  Body mass index is 23.5 kg/m. Gen: Afebrile. No acute distress. Nontoxic in appearance, well developed, well nourished. HENT: AT. [place]. Bilateral TM visualized with fullness, no erythema. MMM, no oral lesions. Bilateral nares with erythema, dried blood and drainage. Throat without erythema or exudates. Mild cough, TTP left max sinus Eyes:Pupils Equal Round Reactive to light, Extraocular movements intact,  Conjunctiva without redness, discharge  or icterus. Neck/lymp/endocrine: Supple,no lymphadenopathy CV: RRR  Chest: CTAB, no wheeze or crackles. Good air movement, normal resp effort. Neuro: Normal gait. PERLA. EOMi. Alert. Oriented x3   No exam data present No results found. No results found for this or any previous visit (from the past 24 hour(s)).  Assessment/Plan: Richard Sullivan is a 84 y.o. male present for OV for  1. Chronic bronchitis with acute exacerbation (HCC) Rest, hydrate.   mucinex (DM if cough), nettie pot or nasal saline.  Cefdinir (renally dosed)  prescribed,  take until completed.  Tessalon perles for cough.  If cough present it can last up to 6-8 weeks.  F/U 2 weeks of not improved with PCP    Reviewed expectations re: course of current medical issues.  Discussed self-management of symptoms.  Outlined signs and symptoms indicating need for more acute intervention.  Patient verbalized understanding and all questions were answered.  Patient received an After-Visit Summary.    No orders of the defined types were placed in this encounter.    Note is dictated utilizing voice recognition software. Although note has been proof read prior to signing, occasional typographical errors still can be missed. If any questions arise, please do not hesitate to call for verification.   electronically signed by:  Howard Pouch, DO  [place]

## 2017-05-31 ENCOUNTER — Other Ambulatory Visit: Payer: Self-pay | Admitting: Family Medicine

## 2017-05-31 NOTE — Telephone Encounter (Signed)
Spoke to Dr Deterding's nurse and they have no record of patient being on kayexalate.  She will fax over recent labs as requested.

## 2017-05-31 NOTE — Telephone Encounter (Signed)
Patient aware.

## 2017-05-31 NOTE — Telephone Encounter (Signed)
LMOM for Dr Deterding's nurse to [facility] to discuss.

## 2017-05-31 NOTE — Telephone Encounter (Signed)
OK.  Pls notify pt that he does NOT need to take this med at this time (the kayexalate).-thx

## 2017-06-06 ENCOUNTER — Encounter: Payer: Self-pay | Admitting: Family Medicine

## 2017-07-18 ENCOUNTER — Telehealth: Payer: Self-pay | Admitting: Family Medicine

## 2017-07-18 NOTE — Telephone Encounter (Signed)
Copied from [place]. Topic: Medicare AWV >> Jul 17, 2017  4:40 PM Gerilyn Nestle, RN wrote: Reason for CRM: Attempted to contact patient regarding AWV, no voicemail available.  Requesting patient to complete AWV with [facility] on Friday, 07/21/2017 @ 0830 after appt with PCP. If pt calls back, okay for PEC to schedule. Please skype or call Kim with issues/questions.  >> Jul 18, 2017 10:33 AM Ahmed Prima L wrote: Attempted to call patient to set up his AWV after his appt with PCP. No answer/ could not leave message >> Jul 18, 2017  1:47 PM Cleaster Corin, NT wrote: Pt. stated he doesn't need an AWV anyway he is only coming in to see the doctor on friday at 8am

## 2017-07-21 ENCOUNTER — Ambulatory Visit (INDEPENDENT_AMBULATORY_CARE_PROVIDER_SITE_OTHER): Payer: Medicare Other

## 2017-07-21 ENCOUNTER — Encounter: Payer: Self-pay | Admitting: Family Medicine

## 2017-07-21 ENCOUNTER — Ambulatory Visit (INDEPENDENT_AMBULATORY_CARE_PROVIDER_SITE_OTHER): Payer: Medicare Other | Admitting: Family Medicine

## 2017-07-21 VITALS — BP 161/76 | HR 58 | Temp 97.4°F | Resp 16 | Ht 71.0 in | Wt 172.2 lb

## 2017-07-21 VITALS — BP 161/76 | HR 58 | Temp 97.4°F | Resp 16 | Ht 71.0 in | Wt 172.4 lb

## 2017-07-21 DIAGNOSIS — E7800 Pure hypercholesterolemia, unspecified: Secondary | ICD-10-CM

## 2017-07-21 DIAGNOSIS — Z8639 Personal history of other endocrine, nutritional and metabolic disease: Secondary | ICD-10-CM | POA: Diagnosis not present

## 2017-07-21 DIAGNOSIS — Z0000 Encounter for general adult medical examination without abnormal findings: Secondary | ICD-10-CM | POA: Diagnosis not present

## 2017-07-21 DIAGNOSIS — I482 Chronic atrial fibrillation, unspecified: Secondary | ICD-10-CM

## 2017-07-21 DIAGNOSIS — I350 Nonrheumatic aortic (valve) stenosis: Secondary | ICD-10-CM | POA: Diagnosis not present

## 2017-07-21 DIAGNOSIS — I10 Essential (primary) hypertension: Secondary | ICD-10-CM

## 2017-07-21 DIAGNOSIS — N184 Chronic kidney disease, stage 4 (severe): Secondary | ICD-10-CM

## 2017-07-21 LAB — BASIC METABOLIC PANEL
BUN: 32 mg/dL — ABNORMAL HIGH (ref 6–23)
CO2: 26 mEq/L (ref 19–32)
Calcium: 9.9 mg/dL (ref 8.4–10.5)
Chloride: 105 mEq/L (ref 96–112)
Creatinine, Ser: 2.23 mg/dL — ABNORMAL HIGH (ref 0.40–1.50)
GFR: 29.94 mL/min — ABNORMAL LOW (ref >60.00)
Glucose, Bld: 103 mg/dL — ABNORMAL HIGH (ref 70–99)
Potassium: 5.3 mEq/L — ABNORMAL HIGH (ref 3.5–5.1)
Sodium: 136 mEq/L (ref 135–145)

## 2017-07-21 NOTE — Progress Notes (Signed)
Subjective:   Richard Sullivan is a 84 y.o. male who presents for Medicare Annual/Subsequent preventive examination.  Review of Systems:  No ROS.  Medicare Wellness Visit. Additional risk factors are reflected in the social history.  Cardiac Risk Factors include: advanced age (>38men, >49 women);male gender;dyslipidemia;hypertension   Sleep patterns: Sleeps 6-7 hours, feels rested.  Home Safety/Smoke Alarms: Feels safe in home. Smoke alarms in place.  Living environment; residence and Firearm Safety: Lives alone in single story home. Son lives next door.  Seat Belt Safety/Bike Helmet: Wears seat belt.    Male:   CCS-colonoscopy 04/19/2003, no recall.  PSA- Followed by Urology      Objective:    Vitals: There were no vitals taken for this visit.  There is no height or weight on file to calculate BMI.  Advanced Directives 07/21/2017 04/20/2016 03/16/2016 10/30/2015 10/03/2015 07/28/2015 04/29/2015  Does Patient Have a Medical Advance Directive? No No No No No No No  Would patient like information on creating a medical advance directive? No - Patient declined No - Patient declined No - Patient declined No - patient declined information Yes - Educational materials given - No - patient declined information  Pre-existing out of facility DNR order (yellow form or pink MOST form) - - - - - - -    Tobacco Social History   Tobacco Use  Smoking Status Former Smoker  . Packs/day: 1.00  . Years: 32.00  . Pack years: 32.00  . Types: Cigarettes  . Last attempt to quit: 12/05/1982  . Years since quitting: 34.6  Smokeless Tobacco Never Used     Counseling given: Not Answered    Past Medical History:  Diagnosis Date  . Anemia of chronic renal failure   . Atrial flutter ([place]) 10/2014   started on low dose eliquis by cardiologist  . BPH (benign prostatic hyperplasia)   . Chronic leukopenia    Neutropenia, monocytosis also with chronic thrombocytopenia--Dr. Marin Olp following; stable as  of 03/16/16 f/u.  Marland Kitchen Chronic renal insufficiency, stage 4 (severe) (HCC)    +Proteinuria.  GFR stable at 87ml/min as of 04/2017 neph f/u.  ?nephrotic syndrome in 83s?--no old records. Dr. Jimmy Footman, 2018, renal u/s- medical renal dz/Nephrosclerosis per renal (no RAS).  Cr 2.47/GFR 23 on 02/13/17 neph f/u.  K 5.5.  Marland Kitchen Colon polyps    Colonoscopy x 3 per pt--initial one with polyp but two 5 yr f/u colonoscopies normal per pt.  . Depression   . Gout    Doing ok since off allopurinol 2015  . H/O exercise stress test    about 5 yrs. ago, saw cardiologist, had normal stress test, told NO need  to f/u /w cardiologist   . History of hyperkalemia 07/2016   cutting ACE-I dose in half helped this normalize.  +Recurrence 09/2016 (K 5.7 on neph f/u labs).  Elevated again 01/2017--started pt on scheduled kayexalate.  . Hyperkalemia    Due to CKD stage 4 (off ACE-I)  . Hyperlipidemia   . Hypertension    takes meds daily  . Microhematuria    Renal u/s 08/2013 remarkable only for small nonobstructing stone in each kidney.  Urol feels like this is likely the source of the microhem, cystoscopy likely low yield--following stones with q46mo ultrasounds and office f/u ([facility])  . Severe aortic stenosis    cardiologist recommended TAVR 09/2015 and again 01/2016--pt declines this procedure b/c he is asymptomatic.  . Subclinical hypothyroidism 06/2014   TSH 5.2, normal T4 and T3.  Marland Kitchen  UTI (urinary tract infection) 02/2016   MSSA   Past Surgical History:  Procedure Laterality Date  . COLONOSCOPY    . EYE SURGERY     cataract - right  . INGUINAL HERNIA REPAIR  12/27/2011   Procedure: HERNIA REPAIR INGUINAL ADULT;  Surgeon: Odis Hollingshead, MD;  Location: [facility];  Service: General;  Laterality: Right;  . PROSTATE SURGERY     TURP 1980s/90s.--HELPED.  . TONSILLECTOMY    . TRANSTHORACIC ECHOCARDIOGRAM  07/22/14; 09/2015   EF 55-60%, AS worse than prior echo: mean gradient 44, peak >100: f/u 3 mo per cardiologist.   2017 f/u echo showed progression of AS to severe-to-critical.   Family History  Problem Relation Age of Onset  . Arthritis Mother   . Hypertension Father    Social History   Socioeconomic History  . Marital status: Widowed    Spouse name: Not on file  . Number of children: 1  . Years of education: Not on file  . Highest education level: Not on file  Occupational History  . Occupation: Retired  Scientific laboratory technician  . Financial resource strain: Not on file  . Food insecurity:    Worry: Not on file    Inability: Not on file  . Transportation needs:    Medical: Not on file    Non-medical: Not on file  Tobacco Use  . Smoking status: Former Smoker    Packs/day: 1.00    Years: 32.00    Pack years: 32.00    Types: Cigarettes    Last attempt to quit: 12/05/1982    Years since quitting: 34.6  . Smokeless tobacco: Never Used  Substance and Sexual Activity  . Alcohol use: No    Alcohol/week: 0.0 oz  . Drug use: No  . Sexual activity: Not on file  Lifestyle  . Physical activity:    Days per week: Not on file    Minutes per session: Not on file  . Stress: Not on file  Relationships  . Social connections:    Talks on phone: Not on file    Gets together: Not on file    Attends religious service: Not on file    Active member of club or organization: Not on file    Attends meetings of clubs or organizations: Not on file    Relationship status: Not on file  Other Topics Concern  . Not on file  Social History Narrative   Widower as of 08/2015, has one son.   Orig from [place], [place].   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   [employer] education.   Tob 30 pack-yr hx, quit 1984.   Alcohol-none    Drugso-none   Likes to hunt and fish.  Still mows yard with push mower.    Outpatient Encounter Medications as of 07/21/2017  Medication Sig  . amitriptyline (ELAVIL) 50 MG tablet Take 1 tablet (50 mg total) by mouth at bedtime.  Marland Kitchen ELIQUIS 2.5 MG TABS tablet TAKE 1 TABLET BY MOUTH TWICE A DAY  .  ezetimibe (ZETIA) 10 MG tablet TAKE 1 TABLET (10 MG TOTAL) BY MOUTH DAILY.  . metoprolol succinate (TOPROL-XL) 50 MG 24 hr tablet TAKE 1 TABLET (50 MG TOTAL) BY MOUTH DAILY.  . tamsulosin (FLOMAX) 0.4 MG CAPS capsule TAKE 1 CAPSULE (0.4 MG TOTAL) BY MOUTH DAILY.   No facility-administered encounter medications on file as of 07/21/2017.     Activities of Daily Living In your present state of health, do you have any  difficulty performing the following activities: 07/21/2017  Hearing? N  Vision? N  Difficulty concentrating or making decisions? N  Walking or climbing stairs? N  Dressing or bathing? N  Doing errands, shopping? N  Preparing Food and eating ? N  Using the Toilet? N  In the past six months, have you accidently leaked urine? N  Do you have problems with loss of bowel control? N  Managing your Medications? N  Managing your Finances? N  Housekeeping or managing your Housekeeping? N  Some recent data might be hidden    Patient Care Team: Tammi Sou, MD as PCP - General (Family Medicine) [name], MD as Consulting Physician (Cardiology) Volanda Napoleon, MD as Consulting Physician (Oncology) Ardis Hughs, MD as Attending Physician (Urology) Deterding, Jeneen Rinks, MD as Consulting Physician (Nephrology)   Assessment:   This is a routine wellness examination for [place].  Exercise Activities and Dietary recommendations Current Exercise Habits: The patient does not participate in regular exercise at present(yard work, house work), Exercise limited by: None identified   Diet (meal preparation, eat out, water intake, caffeinated beverages, dairy products, fruits and vegetables): Drinks water.   Breakfast: sausage biscuit, coffee Lunch: sandwich Dinner: soup  Goals    . Patient Stated     Maintain current health by staying active.        Fall Risk Fall Risk  07/21/2017 07/21/2017 04/20/2016 03/16/2016 10/30/2015  Falls in the past year? No No No No No     Depression Screen PHQ 2/9 Scores 07/21/2017 04/20/2016 09/19/2014  PHQ - 2 Score 0 0 0  PHQ- 9 Score 0 - -    Cognitive Function MMSE - Mini Mental State Exam 07/21/2017  Orientation to time 5  Orientation to Place 5  Registration 3  Attention/ Calculation 5  Recall 0  Language- name 2 objects 2  Language- repeat 1  Language- follow 3 step command 3  Language- read & follow direction 1  Write a sentence 1  Copy design 1  Total score 27        Immunization History  Administered Date(s) Administered  . Influenza, High Dose Seasonal PF 01/18/2017  . Influenza,inj,Quad PF,6+ Mos 12/13/2013  . Influenza-Unspecified 12/30/2014, 12/07/2015  . Pneumococcal Conjugate-13 12/13/2013  . Pneumococcal Polysaccharide-23 03/20/2015  . Tdap 09/19/2014  . Zoster 01/09/2013  . Zoster Recombinat (Shingrix) 01/02/2017, 03/04/2017    Screening Tests Health Maintenance  Topic Date Due  . INFLUENZA VACCINE  11/16/2017  . TETANUS/TDAP  09/18/2024  . PNA vac Low Risk Adult  Completed        Plan:    Continue doing brain stimulating activities (puzzles, reading, adult coloring books, staying active) to keep memory sharp.   Continue to eat heart healthy diet (full of fruits, vegetables, whole grains, lean protein, water--limit salt, fat, and sugar intake) and increase physical activity as tolerated.  I have personally reviewed and noted the following in the patient's chart:   . Medical and social history . Use of alcohol, tobacco or illicit drugs  . Current medications and supplements . Functional ability and status . Nutritional status . Physical activity . Advanced directives . List of other physicians . Hospitalizations, surgeries, and ER visits in previous 12 months . Vitals . Screenings to include cognitive, depression, and falls . Referrals and appointments  In addition, I have reviewed and discussed with patient certain preventive protocols, quality metrics, and best  practice recommendations. A written personalized care plan for preventive services as  well as general preventive health recommendations were provided to patient.     Gerilyn Nestle, RN  07/21/2017

## 2017-07-21 NOTE — Progress Notes (Signed)
AWV reviewed and agree. Signed:  Crissie Sickles, MD           07/21/2017

## 2017-07-21 NOTE — Patient Instructions (Addendum)
Continue doing brain stimulating activities (puzzles, reading, adult coloring books, staying active) to keep memory sharp.   Continue to eat heart healthy diet (full of fruits, vegetables, whole grains, lean protein, water--limit salt, fat, and sugar intake) and increase physical activity as tolerated.   Health Maintenance, Male A healthy lifestyle and preventive care is important for your health and wellness. Ask your health care provider about what schedule of regular examinations is right for you. What should I know about weight and diet? Eat a Healthy Diet  Eat plenty of vegetables, fruits, whole grains, low-fat dairy products, and lean protein.  Do not eat a lot of foods high in solid fats, added sugars, or salt.  Maintain a Healthy Weight Regular exercise can help you achieve or maintain a healthy weight. You should:  Do at least 150 minutes of exercise each week. The exercise should increase your heart rate and make you sweat (moderate-intensity exercise).  Do strength-training exercises at least twice a week.  Watch Your Levels of Cholesterol and Blood Lipids  Have your blood tested for lipids and cholesterol every 5 years starting at 82 years of age. If you are at high risk for heart disease, you should start having your blood tested when you are 82 years old. You may need to have your cholesterol levels checked more often if: ? Your lipid or cholesterol levels are high. ? You are older than 82 years of age. ? You are at high risk for heart disease.  What should I know about cancer screening? Many types of cancers can be detected early and may often be prevented. Lung Cancer  You should be screened every year for lung cancer if: ? You are a current smoker who has smoked for at least 30 years. ? You are a former smoker who has quit within the past 15 years.  Talk to your health care provider about your screening options, when you should start screening, and how often you  should be screened.  Colorectal Cancer  Routine colorectal cancer screening usually begins at 82 years of age and should be repeated every 5-10 years until you are 82 years old. You may need to be screened more often if early forms of precancerous polyps or small growths are found. Your health care provider may recommend screening at an earlier age if you have risk factors for colon cancer.  Your health care provider may recommend using home test kits to check for hidden blood in the stool.  A small camera at the end of a tube can be used to examine your colon (sigmoidoscopy or colonoscopy). This checks for the earliest forms of colorectal cancer.  Prostate and Testicular Cancer  Depending on your age and overall health, your health care provider may do certain tests to screen for prostate and testicular cancer.  Talk to your health care provider about any symptoms or concerns you have about testicular or prostate cancer.  Skin Cancer  Check your skin from head to toe regularly.  Tell your health care provider about any new moles or changes in moles, especially if: ? There is a change in a mole's size, shape, or color. ? You have a mole that is larger than a pencil eraser.  Always use sunscreen. Apply sunscreen liberally and repeat throughout the day.  Protect yourself by wearing long sleeves, pants, a wide-brimmed hat, and sunglasses when outside.  What should I know about heart disease, diabetes, and high blood pressure?  If you are 18-39  years of age, have your blood pressure checked every 3-5 years. If you are 81 years of age or older, have your blood pressure checked every year. You should have your blood pressure measured twice-once when you are at a hospital or clinic, and once when you are not at a hospital or clinic. Record the average of the two measurements. To check your blood pressure when you are not at a hospital or clinic, you can use: ? An automated blood pressure  machine at a pharmacy. ? A home blood pressure monitor.  Talk to your health care provider about your target blood pressure.  If you are between 34-26 years old, ask your health care provider if you should take aspirin to prevent heart disease.  Have regular diabetes screenings by checking your fasting blood sugar level. ? If you are at a normal weight and have a low risk for diabetes, have this test once every three years after the age of 107. ? If you are overweight and have a high risk for diabetes, consider being tested at a younger age or more often.  A one-time screening for abdominal aortic aneurysm (AAA) by ultrasound is recommended for men aged 57-75 years who are current or former smokers. What should I know about preventing infection? Hepatitis B If you have a higher risk for hepatitis B, you should be screened for this virus. Talk with your health care provider to find out if you are at risk for hepatitis B infection. Hepatitis C Blood testing is recommended for:  Everyone born from [birth year] through 1963.  Anyone with known risk factors for hepatitis C.  Sexually Transmitted Diseases (STDs)  You should be screened each year for STDs including gonorrhea and chlamydia if: ? You are sexually active and are younger than 82 years of age. ? You are older than 82 years of age and your health care provider tells you that you are at risk for this type of infection. ? Your sexual activity has changed since you were last screened and you are at an increased risk for chlamydia or gonorrhea. Ask your health care provider if you are at risk.  Talk with your health care provider about whether you are at high risk of being infected with HIV. Your health care provider may recommend a prescription medicine to help prevent HIV infection.  What else can I do?  Schedule regular health, dental, and eye exams.  Stay current with your vaccines (immunizations).  Do not use any tobacco products,  such as cigarettes, chewing tobacco, and e-cigarettes. If you need help quitting, ask your health care provider.  Limit alcohol intake to no more than 2 drinks per day. One drink equals 12 ounces of beer, 5 ounces of wine, or 1 ounces of hard liquor.  Do not use street drugs.  Do not share needles.  Ask your health care provider for help if you need support or information about quitting drugs.  Tell your health care provider if you often feel depressed.  Tell your health care provider if you have ever been abused or do not feel safe at home. This information is not intended to replace advice given to you by your health care provider. Make sure you discuss any questions you have with your health care provider. Document Released: 10/01/2007 Document Revised: 12/02/2015 Document Reviewed: 01/06/2015 Elsevier Interactive Patient Education  [employer].

## 2017-07-21 NOTE — Progress Notes (Signed)
OFFICE VISIT  07/21/2017   CC:  Chief Complaint  Patient presents with  . Follow-up    RCI, pt is fasting.     HPI:    Patient is a 84 y.o. Caucasian male who presents for f/u HTN, CRI stage 4 with hx of hyperkalemia, and HLD. He has had known severe AS for a few years and is followed by cardiologist and pt has repeatedly chosen to NOT pursue valve replacement surgery b/c he is asymptomatic.  Also followed by cardiology for chronic a-fib--on eliquis.   HTN: home monitoring consistently 130/60.    CRI: avoids NSAIDs, avoids potassium, hydrates well.  HLD: on zetia, compliant w/out side effects.  ROS: no CP, no SOB, no dizziness at all with activities.  He is looking forward to starting mowing--he push mows.No hematochezia or melena.  Past Medical History:  Diagnosis Date  . Anemia of chronic renal failure   . Atrial flutter ([place]) 10/2014   started on low dose eliquis by cardiologist  . BPH (benign prostatic hyperplasia)   . Chronic leukopenia    Neutropenia, monocytosis also with chronic thrombocytopenia--Dr. Marin Olp following; stable as of 03/16/16 f/u.  Marland Kitchen Chronic renal insufficiency, stage 4 (severe) (HCC)    +Proteinuria.  GFR stable at 38ml/min as of 04/2017 neph f/u.  ?nephrotic syndrome in 57s?--no old records. Dr. Jimmy Footman, 2018, renal u/s- medical renal dz/Nephrosclerosis per renal (no RAS).  Cr 2.47/GFR 23 on 02/13/17 neph f/u.  K 5.5.  Marland Kitchen Colon polyps    Colonoscopy x 3 per pt--initial one with polyp but two 5 yr f/u colonoscopies normal per pt.  . Depression   . Gout    Doing ok since off allopurinol 2015  . H/O exercise stress test    about 5 yrs. ago, saw cardiologist, had normal stress test, told NO need  to f/u /w cardiologist   . History of hyperkalemia 07/2016   cutting ACE-I dose in half helped this normalize.  +Recurrence 09/2016 (K 5.7 on neph f/u labs).  Elevated again 01/2017--started pt on scheduled kayexalate.  . Hyperkalemia    Due to CKD stage 4  (off ACE-I)  . Hyperlipidemia   . Hypertension    takes meds daily  . Microhematuria    Renal u/s 08/2013 remarkable only for small nonobstructing stone in each kidney.  Urol feels like this is likely the source of the microhem, cystoscopy likely low yield--following stones with q45mo ultrasounds and office f/u ([facility])  . Severe aortic stenosis    cardiologist recommended TAVR 09/2015 and again 01/2016--pt declines this procedure b/c he is asymptomatic.  . Subclinical hypothyroidism 06/2014   TSH 5.2, normal T4 and T3.  Marland Kitchen UTI (urinary tract infection) 02/2016   MSSA    Past Surgical History:  Procedure Laterality Date  . COLONOSCOPY    . EYE SURGERY     cataract - right  . INGUINAL HERNIA REPAIR  12/27/2011   Procedure: HERNIA REPAIR INGUINAL ADULT;  Surgeon: Odis Hollingshead, MD;  Location: [place];  Service: General;  Laterality: Right;  . PROSTATE SURGERY     TURP 1980s/90s.--HELPED.  . TONSILLECTOMY    . TRANSTHORACIC ECHOCARDIOGRAM  07/22/14; 09/2015   EF 55-60%, AS worse than prior echo: mean gradient 44, peak >100: f/u 3 mo per cardiologist.  2017 f/u echo showed progression of AS to severe-to-critical.    Outpatient Medications Prior to Visit  Medication Sig Dispense Refill  . amitriptyline (ELAVIL) 50 MG tablet Take 1 tablet (50 mg total)  by mouth at bedtime. 90 tablet 3  . ELIQUIS 2.5 MG TABS tablet TAKE 1 TABLET BY MOUTH TWICE A DAY 180 tablet 1  . ezetimibe (ZETIA) 10 MG tablet TAKE 1 TABLET (10 MG TOTAL) BY MOUTH DAILY. 90 tablet 1  . metoprolol succinate (TOPROL-XL) 50 MG 24 hr tablet TAKE 1 TABLET (50 MG TOTAL) BY MOUTH DAILY. 90 tablet 1  . tamsulosin (FLOMAX) 0.4 MG CAPS capsule TAKE 1 CAPSULE (0.4 MG TOTAL) BY MOUTH DAILY. 90 capsule 1  . benzonatate (TESSALON) 100 MG capsule Take 1 capsule (100 mg total) by mouth 2 (two) times daily as needed for cough. (Patient not taking: Reported on 07/21/2017) 20 capsule 0  . cefdinir (OMNICEF) 300 MG capsule Take 1 capsule (300 mg  total) by mouth daily. (Patient not taking: Reported on 07/21/2017) 10 capsule 0   No facility-administered medications prior to visit.     No Known Allergies  ROS As per HPI  PE: Initial bp 161/76.  F/u BP:  Blood pressure (!) 161/76, pulse (!) 58, temperature (!) 97.4 F (36.3 C), temperature source Oral, resp. rate 16, height 5\' 11"  (1.803 m), weight 172 lb 4 oz (78.1 kg), SpO2 100 %. Body mass index is 24.02 kg/m.  Gen: Alert, well appearing.  Patient is oriented to person, place, time, and situation. AFFECT: pleasant, lucid thought and speech. CV: Irreg irreg, rate 71Q, 3/6 systolic murmur, no diastolic murmur, no rub or gallop. Chest is clear, no wheezing or rales. Normal symmetric air entry throughout both lung fields. No chest wall deformities or tenderness. EXT: no clubbing, cyanosis, or edema.   LABS:  Lab Results  Component Value Date   TSH 3.82 07/19/2016   Lab Results  Component Value Date   WBC 1.5 05/09/2017   HGB 16.1 05/09/2017   HCT 46 05/09/2017   MCV 90.2 01/18/2017   PLT 117 (A) 05/09/2017   Lab Results  Component Value Date   CREATININE 2.5 (A) 05/09/2017   BUN 35 (A) 05/09/2017   NA 138 05/09/2017   K 5.1 05/09/2017   CL 105 02/15/2017   CO2 28 02/15/2017   Lab Results  Component Value Date   ALT 11 01/18/2017   AST 16 01/18/2017   ALKPHOS 76 01/18/2017   BILITOT 1.4 (H) 01/18/2017   Lab Results  Component Value Date   CHOL 109 01/18/2017   Lab Results  Component Value Date   HDL 31.50 (L) 01/18/2017   Lab Results  Component Value Date   LDLCALC 64 01/18/2017   Lab Results  Component Value Date   TRIG 68.0 01/18/2017   Lab Results  Component Value Date   CHOLHDL 3 01/18/2017    IMPRESSION AND PLAN:  1) HTN: home bp's consistently at goal.  No change in med today. BMET today.  2) CRI stage 4, hx of hyperkalemia: continue low K diet, avoid NSAIDs, hydrate well. Follow up with renal as planned next month.  BMET  today.  3) HLD: tolerating zetia.  Last LDL was 64 six months ago---plan repeat 6 mo.  4) Severe aortic stenosis--asymptomatic.  Pt declines intervention. Continue appropriate specialist f/u. Signs/symptoms to call or return for were reviewed and pt expressed understanding.Marland Kitchen  5) Chronic a-fib; rate controlled + eliquis. No sign of bleeding.  Last Hb 04/2017 was normal.  An After Visit Summary was printed and given to the patient.  FOLLOW UP: Return in about 3 months (around 10/20/2017) for routine chronic illness f/u.  Signed:  Abbe Amsterdam  McGowen, MD           07/21/2017

## 2017-07-24 NOTE — Progress Notes (Signed)
Phone call attempted on both home and cell phone, unable to leave message. Will call back again at later time.

## 2017-07-27 ENCOUNTER — Other Ambulatory Visit: Payer: Self-pay

## 2017-07-27 MED ORDER — EZETIMIBE 10 MG PO TABS: 10.0000 mg | ORAL_TABLET | Freq: Every day | ORAL | 2 refills | Status: DC

## 2017-07-27 MED ORDER — TAMSULOSIN HCL 0.4 MG PO CAPS: 0.4000 mg | ORAL_CAPSULE | Freq: Every day | ORAL | 2 refills | Status: DC

## 2017-07-27 MED ORDER — METOPROLOL SUCCINATE ER 50 MG PO TB24: 50.0000 mg | ORAL_TABLET | Freq: Every day | ORAL | 2 refills | Status: DC

## 2017-08-21 LAB — BASIC METABOLIC PANEL
BUN: 43 — AB (ref 4–21)
Creatinine: 2.6 — AB (ref 0.6–1.3)
Glucose: 98
Potassium: 5.7 — AB (ref 3.4–5.3)
Sodium: 132 — AB (ref 137–147)

## 2017-08-21 LAB — HEPATIC FUNCTION PANEL
ALT: 12 (ref 10–40)
AST: 16 (ref 14–40)
Alkaline Phosphatase: 89 (ref 25–125)
Bilirubin, Total: 1.1

## 2017-08-22 ENCOUNTER — Encounter: Payer: Self-pay | Admitting: Family Medicine

## 2017-09-12 ENCOUNTER — Encounter: Payer: Self-pay | Admitting: Family Medicine

## 2017-10-26 ENCOUNTER — Other Ambulatory Visit: Payer: Self-pay | Admitting: Cardiovascular Disease

## 2017-10-26 ENCOUNTER — Other Ambulatory Visit: Payer: Self-pay | Admitting: Family Medicine

## 2017-10-26 NOTE — Telephone Encounter (Signed)
RF request for amitriptyline LOV: 07/21/17 Next ov: 10/27/17 Last written: 10/31/16 #90 w/ 3RF  Please advise. Thanks.

## 2017-10-26 NOTE — Telephone Encounter (Addendum)
Eliquis 2.5mg  refill request received; pt is 84 yrs old, wt-78.1kg, Crea-2.6 on 08/21/17, pt has not been seen by Dr. Angelena Form since 01/20/2016; called pt at 1017am but no answer on primary phone line & no voicemail came on.

## 2017-10-27 ENCOUNTER — Encounter: Payer: Self-pay | Admitting: Family Medicine

## 2017-10-27 ENCOUNTER — Telehealth: Payer: Self-pay | Admitting: *Deleted

## 2017-10-27 ENCOUNTER — Ambulatory Visit: Payer: Medicare Other | Admitting: Family Medicine

## 2017-10-27 VITALS — BP 120/81 | HR 76 | Temp 98.3°F | Resp 16 | Ht 71.0 in | Wt 171.2 lb

## 2017-10-27 DIAGNOSIS — D696 Thrombocytopenia, unspecified: Secondary | ICD-10-CM | POA: Diagnosis not present

## 2017-10-27 DIAGNOSIS — I10 Essential (primary) hypertension: Secondary | ICD-10-CM | POA: Diagnosis not present

## 2017-10-27 DIAGNOSIS — N184 Chronic kidney disease, stage 4 (severe): Secondary | ICD-10-CM | POA: Diagnosis not present

## 2017-10-27 DIAGNOSIS — Z7901 Long term (current) use of anticoagulants: Secondary | ICD-10-CM | POA: Diagnosis not present

## 2017-10-27 DIAGNOSIS — I480 Paroxysmal atrial fibrillation: Secondary | ICD-10-CM | POA: Diagnosis not present

## 2017-10-27 DIAGNOSIS — D72819 Decreased white blood cell count, unspecified: Secondary | ICD-10-CM

## 2017-10-27 LAB — BASIC METABOLIC PANEL
BUN: 29 mg/dL — ABNORMAL HIGH (ref 6–23)
CO2: 28 mEq/L (ref 19–32)
Calcium: 10 mg/dL (ref 8.4–10.5)
Chloride: 103 mEq/L (ref 96–112)
Creatinine, Ser: 2.11 mg/dL — ABNORMAL HIGH (ref 0.40–1.50)
GFR: 31.9 mL/min — ABNORMAL LOW (ref >60.00)
Glucose, Bld: 101 mg/dL — ABNORMAL HIGH (ref 70–99)
Potassium: 4.6 mEq/L (ref 3.5–5.1)
Sodium: 137 mEq/L (ref 135–145)

## 2017-10-27 LAB — CBC WITH DIFFERENTIAL/PLATELET
Basophils Absolute: 0.1 10*3/uL (ref 0.0–0.1)
Basophils Relative: 3.5 % — ABNORMAL HIGH (ref 0.0–3.0)
Eosinophils Absolute: 0.1 10*3/uL (ref 0.0–0.7)
Eosinophils Relative: 4.3 % (ref 0.0–5.0)
HCT: 47.2 % (ref 39.0–52.0)
Hemoglobin: 16.4 g/dL (ref 13.0–17.0)
Lymphocytes Relative: 48.3 % — ABNORMAL HIGH (ref 12.0–46.0)
Lymphs Abs: 0.8 10*3/uL (ref 0.7–4.0)
MCHC: 34.7 g/dL (ref 30.0–36.0)
MCV: 88.1 fl (ref 78.0–100.0)
Monocytes Absolute: 0.5 10*3/uL (ref 0.1–1.0)
Monocytes Relative: 29.7 % — ABNORMAL HIGH (ref 3.0–12.0)
Neutro Abs: 0.2 10*3/uL — ABNORMAL LOW (ref 1.4–7.7)
Neutrophils Relative %: 14.2 % — ABNORMAL LOW (ref 43.0–77.0)
Platelets: 128 10*3/uL — ABNORMAL LOW (ref 150.0–400.0)
RBC: 5.36 Mil/uL (ref 4.22–5.81)
RDW: 14.2 % (ref 11.5–15.5)
WBC: 1.6 10*3/uL — CL (ref 4.0–10.5)

## 2017-10-27 NOTE — Telephone Encounter (Signed)
Holly w/ [facility] called with Critical Lab Result:  WBC: 1.6  Please advise. Thanks.

## 2017-10-27 NOTE — Telephone Encounter (Signed)
This result is consistent with patient's baseline level of leukopenia, for which he is getting hematology follow up. Signed:  Crissie Sickles, MD           10/27/2017

## 2017-10-27 NOTE — Progress Notes (Signed)
OFFICE VISIT  10/27/2017   CC:  Chief Complaint  Patient presents with  . Follow-up    RCI, pt is fasting.     HPI:    Patient is a 84 y.o. Caucasian male who presents for 3 mo f/u HTN, CRI stage IV with associated hyperkalemia, and a-fib on chronic anticoagulation and rate control. He has severe aortic stenosis being followed by cardiology but pt has declined any surgical intervention b/c he is asymptomatic.  HTN: 120s/75-80.  HR 90 per pt. Denies palpitations, heart racing, dizziness.  No CP or SOB. Still push mowing lawn and active in house and yard w/out any exertional sx's.  CRI: drinks about 48 oz water daily, 1 pepsi per day, 3-4 cups coffee per day.  Has chronic leukopenia and thrombocytopenia that he used to see Dr. Marin Olp for---last f/u in EMR is 03/16/16. Dr. Marin Olp recommended 4 mo f/u at that time.  ROS: no melena, hematochezia, nose bleeds, or LE swelling.  Also, see above in HPI.  Past Medical History:  Diagnosis Date  . Anemia of chronic renal failure   . Atrial flutter ([place]) 10/2014   started on low dose eliquis by cardiologist  . BPH (benign prostatic hyperplasia)   . Chronic leukopenia    Neutropenia, monocytosis also with chronic thrombocytopenia--Dr. Marin Olp following; stable as of 03/16/16 f/u.  Marland Kitchen Chronic renal insufficiency, stage 4 (severe) (HCC)    +Proteinuria.  GFR stable at 73ml/min as of 08/2017 neph f/u.  ?nephrotic syndrome in 20s?--no old records. Dr. Jimmy Footman, 2018, renal u/s- medical renal dz/Nephrosclerosis per renal (no RAS).  Cr 2.47/GFR 23 on 02/13/17 neph f/u.  K 5.5.  Marland Kitchen Colon polyps    Colonoscopy x 3 per pt--initial one with polyp but two 5 yr f/u colonoscopies normal per pt.  . Depression   . Gout    Doing ok since off allopurinol 2015  . H/O exercise stress test    about 5 yrs. ago, saw cardiologist, had normal stress test, told NO need  to f/u /w cardiologist   . History of hyperkalemia 07/2016   cutting ACE-I dose in half  helped this normalize.  +Recurrence 09/2016 (K 5.7 on neph f/u labs).  Elevated again 01/2017--started pt on scheduled kayexalate.  . Hyperkalemia    Due to CKD stage 4 (off ACE-I)  . Hyperlipidemia   . Hypertension    takes meds daily  . Microhematuria    Renal u/s 08/2013 remarkable only for small nonobstructing stone in each kidney.  Urol feels like this is likely the source of the microhem, cystoscopy likely low yield--following stones with q30mo ultrasounds and office f/u ([facility])  . Severe aortic stenosis    cardiologist recommended TAVR 09/2015 and again 01/2016--pt declines this procedure b/c he is asymptomatic.  . Subclinical hypothyroidism 06/2014   TSH 5.2, normal T4 and T3.  Marland Kitchen UTI (urinary tract infection) 02/2016   MSSA    Past Surgical History:  Procedure Laterality Date  . COLONOSCOPY    . EYE SURGERY     cataract - right  . INGUINAL HERNIA REPAIR  12/27/2011   Procedure: HERNIA REPAIR INGUINAL ADULT;  Surgeon: Odis Hollingshead, MD;  Location: [place];  Service: General;  Laterality: Right;  . PROSTATE SURGERY     TURP 1980s/90s.--HELPED.  . TONSILLECTOMY    . TRANSTHORACIC ECHOCARDIOGRAM  07/22/14; 09/2015   EF 55-60%, AS worse than prior echo: mean gradient 44, peak >100: f/u 3 mo per cardiologist.  2017 f/u echo showed  progression of AS to severe-to-critical.    Outpatient Medications Prior to Visit  Medication Sig Dispense Refill  . amitriptyline (ELAVIL) 50 MG tablet TAKE 1 TABLET BY MOUTH EVERYDAY AT BEDTIME 90 tablet 3  . amLODipine (NORVASC) 5 MG tablet Take 1 tablet by mouth daily.    Marland Kitchen ELIQUIS 2.5 MG TABS tablet TAKE 1 TABLET BY MOUTH TWICE A DAY 180 tablet 1  . ezetimibe (ZETIA) 10 MG tablet Take 1 tablet (10 mg total) by mouth daily. 90 tablet 2  . metoprolol succinate (TOPROL-XL) 50 MG 24 hr tablet Take 1 tablet (50 mg total) by mouth daily. 90 tablet 2  . tamsulosin (FLOMAX) 0.4 MG CAPS capsule Take 1 capsule (0.4 mg total) by mouth daily. 90 capsule 2   No  facility-administered medications prior to visit.     No Known Allergies  ROS As per HPI  PE: Blood pressure 120/81, pulse 76, temperature 98.3 F (36.8 C), temperature source Oral, resp. rate 16, height 5\' 11"  (1.803 m), weight 171 lb 4 oz (77.7 kg), SpO2 96 %. Gen: Alert, well appearing.  Patient is oriented to person, place, time, and situation. AFFECT: pleasant, lucid thought and speech. CV: RRR with occ ectopy, rate about 90, 2-5/0 harsh systolic murmur, no diastolic murmur.  No rub/gallop. Chest is clear, no wheezing or rales. Normal symmetric air entry throughout both lung fields. No chest wall deformities or tenderness. EXT: no clubbing, cyanosis, or edema.    LABS:  Lab Results  Component Value Date   TSH 3.82 07/19/2016   Lab Results  Component Value Date   WBC 1.5 05/09/2017   HGB 16.1 05/09/2017   HCT 46 05/09/2017   MCV 90.2 01/18/2017   PLT 117 (A) 05/09/2017   Lab Results  Component Value Date   CREATININE 2.6 (A) 08/21/2017   BUN 43 (A) 08/21/2017   NA 132 (A) 08/21/2017   K 5.7 (A) 08/21/2017   CL 105 07/21/2017   CO2 26 07/21/2017   Lab Results  Component Value Date   ALT 12 08/21/2017   AST 16 08/21/2017   ALKPHOS 89 08/21/2017   BILITOT 1.4 (H) 01/18/2017   Lab Results  Component Value Date   CHOL 109 01/18/2017   Lab Results  Component Value Date   HDL 31.50 (L) 01/18/2017   Lab Results  Component Value Date   LDLCALC 64 01/18/2017   Lab Results  Component Value Date   TRIG 68.0 01/18/2017   Lab Results  Component Value Date   CHOLHDL 3 01/18/2017    IMPRESSION AND PLAN:  1) HTN: The current medical regimen is effective;  continue present plan and medications. Lytes/cr today.  2) CRI IV: has f/u with Dr. Jimmy Footman likely in Aug or Sept per pt. Will check BMET today. Avoiding nephrotoxic agents.  Emphasized importance of good hydration.  3) A-fib with chronic anticoagulation. Asymptomatic: rate control. CBC  today.  4) Hx of leukopenia and thrombocytopenia: is overdue for f/u with hem/onc. CBC w/diff today. Instructions:  At your earliest convenience, call Dr. Antonieta Pert office to arrange a follow up appointment [phone]).  An After Visit Summary was printed and given to the patient.  FOLLOW UP: Return in about 4 months (around 02/27/2018) for routine chronic illness f/u.  Signed:  Crissie Sickles, MD           10/27/2017

## 2017-10-27 NOTE — Patient Instructions (Signed)
  At your earliest convenience, call Dr. Antonieta Pert office to arrange a follow up appointment [phone]).

## 2017-11-02 NOTE — Telephone Encounter (Signed)
Sent in rx with no refills and msg for pt to call to schedule f/u, overdue for appt with Dr Angelena Form. Will also sent note to scheduling to help coordinate follow up with pt.

## 2017-11-23 ENCOUNTER — Ambulatory Visit: Payer: Medicare Other | Admitting: Family Medicine

## 2017-11-23 ENCOUNTER — Other Ambulatory Visit: Payer: Self-pay

## 2017-11-23 ENCOUNTER — Encounter: Payer: Self-pay | Admitting: Family Medicine

## 2017-11-23 VITALS — BP 160/82 | HR 100 | Temp 97.5°F | Ht 71.65 in | Wt 172.6 lb

## 2017-11-23 DIAGNOSIS — L309 Dermatitis, unspecified: Secondary | ICD-10-CM | POA: Diagnosis not present

## 2017-11-23 MED ORDER — CLOBETASOL PROPIONATE 0.05 % EX CREA: 1.0000 "application " | TOPICAL_CREAM | Freq: Two times a day (BID) | CUTANEOUS | 0 refills | Status: DC

## 2017-11-23 NOTE — Progress Notes (Signed)
8/8/201912:07 PM  [place] April 06, 1931, 84 y.o. male [id number]  Chief Complaint  Patient presents with  . Pruritis    on the back for 6 weeks, also has bump on the back that feels like it has fluid inside    HPI:   Patient is a 84 y.o. male with past medical history significant for HTN, CKD4, HLP,  afib on anticoag and severe AS and BPH who presents today for rash on back for past 6 weeks  Patient reports a very itchy rash for the past 6 weeks Denies any new soaps, detergents, etc Has been putting on OTC hydrocortisone wo relief Has never had similar rash before Denies fever, chills, malaise, rash anywhere else  Patient also with a cyst on right upper back, his PCP has reassured him that it is benign   Checks BP at home - good, last visit with PCP in July 2019  Patient Care Team: Tammi Sou, MD as PCP - General (Family Medicine) [name], MD as Consulting Physician (Cardiology) Marin Olp Rudell Cobb, MD as Consulting Physician (Oncology) Ardis Hughs, MD as Attending Physician (Urology) Deterding, Jeneen Rinks, MD as Consulting Physician (Nephrology)  Fall Risk  11/23/2017 07/21/2017 07/21/2017 04/20/2016 03/16/2016  Falls in the past year? No No No No No     Depression screen [facility] 2/9 11/23/2017 07/21/2017 04/20/2016  Decreased Interest 0 0 0  Down, Depressed, Hopeless 0 0 0  PHQ - 2 Score 0 0 0  Altered sleeping - 0 -  Tired, decreased energy - 0 -  Change in appetite - 0 -  Feeling bad or failure about yourself  - 0 -  Trouble concentrating - 0 -  Moving slowly or fidgety/restless - 0 -  Suicidal thoughts - 0 -  PHQ-9 Score - 0 -    No Known Allergies  Prior to Admission medications   Medication Sig Start Date End Date Taking? Authorizing Provider  amitriptyline (ELAVIL) 50 MG tablet TAKE 1 TABLET BY MOUTH EVERYDAY AT BEDTIME 10/27/17  Yes McGowen, Adrian Blackwater, MD  amLODipine (NORVASC) 5 MG tablet Take 1 tablet by mouth daily. 10/26/17  Yes  [provider]  apixaban (ELIQUIS) 2.5 MG TABS tablet Take 1 tablet by mouth twice a day. Call clinic - overdue for appt with Dr Angelena Form. 11/02/17  Yes [name], MD  ezetimibe (ZETIA) 10 MG tablet Take 1 tablet (10 mg total) by mouth daily. 07/27/17  Yes McGowen, Adrian Blackwater, MD  metoprolol succinate (TOPROL-XL) 50 MG 24 hr tablet Take 1 tablet (50 mg total) by mouth daily. 07/27/17  Yes McGowen, Adrian Blackwater, MD  tamsulosin (FLOMAX) 0.4 MG CAPS capsule Take 1 capsule (0.4 mg total) by mouth daily. 07/27/17  Yes McGowen, Adrian Blackwater, MD    Past Medical History:  Diagnosis Date  . Anemia of chronic renal failure   . Atrial flutter ([place]) 10/2014   started on low dose eliquis by cardiologist  . BPH (benign prostatic hyperplasia)   . Chronic leukopenia    Neutropenia, monocytosis also with chronic thrombocytopenia--Dr. Marin Olp following; stable as of 03/16/16 f/u.  Marland Kitchen Chronic renal insufficiency, stage 4 (severe) (HCC)    +Proteinuria.  GFR stable at 21ml/min as of 08/2017 neph f/u.  ?nephrotic syndrome in 16s?--no old records. Dr. Jimmy Footman, 2018, renal u/s- medical renal dz/Nephrosclerosis per renal (no RAS).  Cr 2.47/GFR 23 on 02/13/17 neph f/u.  K 5.5.  Marland Kitchen Colon polyps    Colonoscopy x 3 per pt--initial one  with polyp but two 5 yr f/u colonoscopies normal per pt.  . Depression   . Gout    Doing ok since off allopurinol 2015  . H/O exercise stress test    about 5 yrs. ago, saw cardiologist, had normal stress test, told NO need  to f/u /w cardiologist   . History of hyperkalemia 07/2016   cutting ACE-I dose in half helped this normalize.  +Recurrence 09/2016 (K 5.7 on neph f/u labs).  Elevated again 01/2017--started pt on scheduled kayexalate.  . Hyperkalemia    Due to CKD stage 4 (off ACE-I)  . Hyperlipidemia   . Hypertension    takes meds daily  . Microhematuria    Renal u/s 08/2013 remarkable only for small nonobstructing stone in each kidney.  Urol feels like this is likely  the source of the microhem, cystoscopy likely low yield--following stones with q73mo ultrasounds and office f/u ([facility])  . Severe aortic stenosis    cardiologist recommended TAVR 09/2015 and again 01/2016--pt declines this procedure b/c he is asymptomatic.  . Subclinical hypothyroidism 06/2014   TSH 5.2, normal T4 and T3.  Marland Kitchen UTI (urinary tract infection) 02/2016   MSSA    Past Surgical History:  Procedure Laterality Date  . COLONOSCOPY    . EYE SURGERY     cataract - right  . INGUINAL HERNIA REPAIR  12/27/2011   Procedure: HERNIA REPAIR INGUINAL ADULT;  Surgeon: Odis Hollingshead, MD;  Location: [facility];  Service: General;  Laterality: Right;  . PROSTATE SURGERY     TURP 1980s/90s.--HELPED.  . TONSILLECTOMY    . TRANSTHORACIC ECHOCARDIOGRAM  07/22/14; 09/2015   EF 55-60%, AS worse than prior echo: mean gradient 44, peak >100: f/u 3 mo per cardiologist.  2017 f/u echo showed progression of AS to severe-to-critical.    Social History   Tobacco Use  . Smoking status: Former Smoker    Packs/day: 1.00    Years: 32.00    Pack years: 32.00    Types: Cigarettes    Last attempt to quit: 12/05/1982    Years since quitting: 34.9  . Smokeless tobacco: Never Used  Substance Use Topics  . Alcohol use: No    Alcohol/week: 0.0 standard drinks    Family History  Problem Relation Age of Onset  . Arthritis Mother   . Hypertension Father     ROS Per hpi  OBJECTIVE:  Blood pressure (!) 160/82, pulse 100, temperature (!) 97.5 F (36.4 C), temperature source Oral, height 5' 11.65" (1.82 m), weight 172 lb 9.6 oz (78.3 kg), SpO2 95 %. Body mass index is 23.64 kg/m.   BP Readings from Last 3 Encounters:  11/23/17 (!) 160/82  10/27/17 120/81  07/21/17 (!) 161/76    Physical Exam   Gen: AAOx3, NAD Skin: Discrete erythematous plaques with scaling across upper back with some degree of excoriation.    ASSESSMENT and PLAN   1. Dermatitis Discussed supportive measures, new medication  r/se/b. - clobetasol cream (TEMOVATE) 0.05 %; Apply 1 application topically 2 (two) times daily.  BP elevated today in clinic. Reports home readings "are good". Advised followup with PCP as scheduled  Return if symptoms worsen or fail to improve.    Rutherford Guys, MD [facility] [place], [place] [address] Ph.  [phone] Fax [phone]

## 2017-11-23 NOTE — Patient Instructions (Addendum)
IF you received an x-ray today, you will receive an invoice from [facility]. Please contact [facility] at [phone] with questions or concerns regarding your invoice.   IF you received labwork today, you will receive an invoice from [place]. Please contact [facility] at [phone] with questions or concerns regarding your invoice.   Our billing staff will not be able to assist you with questions regarding bills from these companies.  You will be contacted with the lab results as soon as they are available. The fastest way to get your results is to activate your My Chart account. Instructions are located on the last page of this paperwork. If you have not heard from [place] regarding the results in 2 weeks, please contact this office.     Contact Dermatitis Dermatitis is redness, soreness, and swelling (inflammation) of the skin. Contact dermatitis is a reaction to certain substances that touch the skin. There are two types of contact dermatitis:  Irritant contact dermatitis. This type is caused by something that irritates your skin, such as dry hands from washing them too much. This type does not require previous exposure to the substance for a reaction to occur. This type is more common.  Allergic contact dermatitis. This type is caused by a substance that you are allergic to, such as a nickel allergy or poison ivy. This type only occurs if you have been exposed to the substance (allergen) before. Upon a repeat exposure, your body reacts to the substance. This type is less common.  What are the causes? Many different substances can cause contact dermatitis. Irritant contact dermatitis is most commonly caused by exposure to:  Makeup.  Soaps.  Detergents.  Bleaches.  Acids.  Metal salts, such as nickel.  Allergic contact dermatitis is most commonly caused by exposure to:  Poisonous plants.  Chemicals.  Jewelry.  Latex.  Medicines.  Preservatives  in products, such as clothing.  What increases the risk? This condition is more likely to develop in:  People who have jobs that expose them to irritants or allergens.  People who have certain medical conditions, such as asthma or eczema.  What are the signs or symptoms? Symptoms of this condition may occur anywhere on your body where the irritant has touched you or is touched by you. Symptoms include:  Dryness or flaking.  Redness.  Cracks.  Itching.  Pain or a burning feeling.  Blisters.  Drainage of small amounts of blood or clear fluid from skin cracks.  With allergic contact dermatitis, there may also be swelling in areas such as the eyelids, mouth, or genitals. How is this diagnosed? This condition is diagnosed with a medical history and physical exam. A patch skin test may be performed to help determine the cause. If the condition is related to your job, you may need to see an occupational medicine specialist. How is this treated? Treatment for this condition includes figuring out what caused the reaction and protecting your skin from further contact. Treatment may also include:  Steroid creams or ointments. Oral steroid medicines may be needed in more severe cases.  Antibiotics or antibacterial ointments, if a skin infection is present.  Antihistamine lotion or an antihistamine taken by mouth to ease itching.  A bandage (dressing).  Follow these instructions at home: [place] your skin as needed.  Apply cool compresses to the affected areas.  Try taking a bath with: ? Epsom salts. Follow the instructions on the packaging. You can get these  at your local pharmacy or grocery store. ? Baking soda. Pour a small amount into the bath as directed by your health care provider. ? Colloidal oatmeal. Follow the instructions on the packaging. You can get this at your local pharmacy or grocery store.  Try applying baking soda paste to your skin. Stir water  into baking soda until it reaches a paste-like consistency.  Do not scratch your skin.  Bathe less frequently, such as every other day.  Bathe in lukewarm water. Avoid using hot water. Medicines  Take or apply over-the-counter and prescription medicines only as told by your health care provider.  If you were prescribed an antibiotic medicine, take or apply your antibiotic as told by your health care provider. Do not stop using the antibiotic even if your condition starts to improve. General instructions  Keep all follow-up visits as told by your health care provider. This is important.  Avoid the substance that caused your reaction. If you do not know what caused it, keep a journal to try to track what caused it. Write down: ? What you eat. ? What cosmetic products you use. ? What you drink. ? What you wear in the affected area. This includes jewelry.  If you were given a dressing, take care of it as told by your health care provider. This includes when to change and remove it. Contact a health care provider if:  Your condition does not improve with treatment.  Your condition gets worse.  You have signs of infection such as swelling, tenderness, redness, soreness, or warmth in the affected area.  You have a fever.  You have new symptoms. Get help right away if:  You have a severe headache, neck pain, or neck stiffness.  You vomit.  You feel very sleepy.  You notice red streaks coming from the affected area.  Your bone or joint underneath the affected area becomes painful after the skin has healed.  The affected area turns darker.  You have difficulty breathing. This information is not intended to replace advice given to you by your health care provider. Make sure you discuss any questions you have with your health care provider. Document Released: 04/01/2000 Document Revised: 09/10/2015 Document Reviewed: 08/20/2014 Elsevier Interactive Patient Education  2018  [employer].

## 2018-01-14 ENCOUNTER — Encounter: Payer: Self-pay | Admitting: Family Medicine

## 2018-01-28 ENCOUNTER — Other Ambulatory Visit: Payer: Self-pay | Admitting: Cardiovascular Disease

## 2018-01-29 NOTE — Telephone Encounter (Signed)
Eliquis 2.5mg  refill request received; pt is 84 yrs old, wt-78.3kg, Crea-2.11 on 10/27/17, last seen by Dr. Angelena Form ion 04/22/15-pt is overdue and needs an appt. Transferred her to [facility] for pt to schedule an appt

## 2018-01-31 ENCOUNTER — Ambulatory Visit (INDEPENDENT_AMBULATORY_CARE_PROVIDER_SITE_OTHER): Payer: Medicare Other | Admitting: Cardiovascular Disease

## 2018-01-31 ENCOUNTER — Encounter: Payer: Self-pay | Admitting: Cardiovascular Disease

## 2018-01-31 VITALS — BP 158/62 | HR 88 | Ht 71.65 in | Wt 169.4 lb

## 2018-01-31 DIAGNOSIS — E7800 Pure hypercholesterolemia, unspecified: Secondary | ICD-10-CM | POA: Diagnosis not present

## 2018-01-31 DIAGNOSIS — I10 Essential (primary) hypertension: Secondary | ICD-10-CM

## 2018-01-31 DIAGNOSIS — I483 Typical atrial flutter: Secondary | ICD-10-CM | POA: Diagnosis not present

## 2018-01-31 DIAGNOSIS — I350 Nonrheumatic aortic (valve) stenosis: Secondary | ICD-10-CM | POA: Diagnosis not present

## 2018-01-31 MED ORDER — APIXABAN 2.5 MG PO TABS: ORAL_TABLET | ORAL | 3 refills | Status: DC

## 2018-01-31 NOTE — Patient Instructions (Signed)
Medication Instructions:  Your physician recommends that you continue on your current medications as directed. Please refer to the Current Medication list given to you today.  If you need a refill on your cardiac medications before your next appointment, please call your pharmacy.   Lab work: none If you have labs (blood work) drawn today and your tests are completely normal, you will receive your results only by: Marland Kitchen MyChart Message (if you have MyChart) OR . A paper copy in the mail If you have any lab test that is abnormal or we need to change your treatment, we will call you to review the results.  Testing/Procedures: Your physician has requested that you have an echocardiogram. Echocardiography is a painless test that uses sound waves to create images of your heart. It provides your doctor with information about the size and shape of your heart and how well your heart's chambers and valves are working. This procedure takes approximately one hour. There are no restrictions for this procedure.    Follow-Up: At [facility], you and your health needs are our priority.  As part of our continuing mission to provide you with exceptional heart care, we have created designated Provider Care Teams.  These Care Teams include your primary Cardiologist (physician) and Advanced Practice Providers (APPs -  Physician Assistants and Nurse Practitioners) who all work together to provide you with the care you need, when you need it. You will need a follow up appointment in 6 months.  Please call our office 2 months in advance to schedule this appointment.  You may see Dr. Angelena Form  or one of the following Advanced Practice Providers on your designated Care Team:   [place], PA-C Melina Copa, PA-C . Ermalinda Barrios, PA-C  Any Other Special Instructions Will Be Listed Below (If Applicable).

## 2018-01-31 NOTE — Progress Notes (Signed)
Chief Complaint  Patient presents with  . Follow-up    Aortic stenosis    History of Present Illness: 84 yo male with history of HTN, HLD, CKD, leukopenia, thrombocytopenic, atrial flutter and aortic stenosis here today for cardiac follow up. I saw him in June 2015 for evaluation of aortic stenosis. Echo 08/21/13 in primary care with moderate to severe AS (mean gradient 37 mm Hg). At our first visit in 2015 he had no symptoms and has remained asymptomatic since then. Repeat echo 10/05/15 with progression of aortic stenosis. (mean gradient 55 mmHg, peak gradient 96 mmHg). He was found to be in atrial flutter at the office visit in July 2016 and was started on Eliquis.  He is followed in oncology for his leukopenia and thrombocytopenia. I last saw him in October 2017 and we discussed TAVR but he did not wish to consider at that time since he had no symptoms.  He is here today for follow up. The patient denies any chest pain, dyspnea, palpitations, lower extremity edema, orthopnea, PND, dizziness, near syncope or syncope.   Primary Care Physician: Tammi Sou, MD   Past Medical History:  Diagnosis Date  . Anemia of chronic renal failure   . Atrial flutter ([place]) 10/2014   started on low dose eliquis by cardiologist  . BPH (benign prostatic hyperplasia)   . Chronic leukopenia    Neutropenia, monocytosis also with chronic thrombocytopenia--Dr. Marin Olp following; stable as of 03/16/16 f/u.  Marland Kitchen Chronic renal insufficiency, stage 4 (severe) (HCC)    Nephrosclerosis.  +Proteinuria.  GFR stable at 26 ml/min (sCr 2.21) as of 12/2017 neph f/u.  ?nephrotic syndrome in 52s?--no old records. Dr. Jimmy Footman, 2018, renal u/s- medical renal dz/Nephrosclerosis per renal (no RAS).  Cr 2.47/GFR 23 on 02/13/17 neph f/u.  K 5.5.  Marland Kitchen Colon polyps    Colonoscopy x 3 per pt--initial one with polyp but two 5 yr f/u colonoscopies normal per pt.  . Depression   . Gout    Doing ok since off allopurinol 2015  . H/O  exercise stress test    about 5 yrs. ago, saw cardiologist, had normal stress test, told NO need  to f/u /w cardiologist   . History of hyperkalemia 07/2016   cutting ACE-I dose in half helped this normalize.  +Recurrence 09/2016 (K 5.7 on neph f/u labs).  Elevated again 01/2017--started pt on scheduled kayexalate.  . Hyperkalemia    Due to CKD stage 4 (improved when ACE-I d/c'd).  . Hyperlipidemia   . Hypertension    takes meds daily  . Microhematuria    Renal u/s 08/2013 remarkable only for small nonobstructing stone in each kidney.  Urol feels like this is likely the source of the microhem, cystoscopy likely low yield--following stones with q51mo ultrasounds and office f/u ([facility])  . Severe aortic stenosis    cardiologist recommended TAVR 09/2015 and again 01/2016--pt declines this procedure b/c he is asymptomatic.  . Subclinical hypothyroidism 06/2014   TSH 5.2, normal T4 and T3.  Marland Kitchen UTI (urinary tract infection) 02/2016   MSSA    Past Surgical History:  Procedure Laterality Date  . COLONOSCOPY    . EYE SURGERY     cataract - right  . INGUINAL HERNIA REPAIR  12/27/2011   Procedure: HERNIA REPAIR INGUINAL ADULT;  Surgeon: Odis Hollingshead, MD;  Location: [facility];  Service: General;  Laterality: Right;  . PROSTATE SURGERY     TURP 1980s/90s.--HELPED.  . TONSILLECTOMY    .  TRANSTHORACIC ECHOCARDIOGRAM  07/22/14; 09/2015   EF 55-60%, AS worse than prior echo: mean gradient 44, peak >100: f/u 3 mo per cardiologist.  2017 f/u echo showed progression of AS to severe-to-critical.    Current Outpatient Medications  Medication Sig Dispense Refill  . amitriptyline (ELAVIL) 50 MG tablet TAKE 1 TABLET BY MOUTH EVERYDAY AT BEDTIME 90 tablet 3  . amLODipine (NORVASC) 5 MG tablet Take 1 tablet by mouth daily.    Marland Kitchen apixaban (ELIQUIS) 2.5 MG TABS tablet Take 1 tablet by mouth twice a day. 180 tablet 3  . ezetimibe (ZETIA) 10 MG tablet Take 1 tablet (10 mg total) by mouth daily. 90 tablet 2  .  metoprolol succinate (TOPROL-XL) 50 MG 24 hr tablet Take 1 tablet (50 mg total) by mouth daily. 90 tablet 2  . tamsulosin (FLOMAX) 0.4 MG CAPS capsule Take 1 capsule (0.4 mg total) by mouth daily. 90 capsule 2   No current facility-administered medications for this visit.     No Known Allergies  Social History   Socioeconomic History  . Marital status: Widowed    Spouse name: Not on file  . Number of children: 1  . Years of education: Not on file  . Highest education level: Not on file  Occupational History  . Occupation: Retired  Scientific laboratory technician  . Financial resource strain: Not on file  . Food insecurity:    Worry: Not on file    Inability: Not on file  . Transportation needs:    Medical: Not on file    Non-medical: Not on file  Tobacco Use  . Smoking status: Former Smoker    Packs/day: 1.00    Years: 32.00    Pack years: 32.00    Types: Cigarettes    Last attempt to quit: 12/05/1982    Years since quitting: 35.1  . Smokeless tobacco: Never Used  Substance and Sexual Activity  . Alcohol use: No    Alcohol/week: 0.0 standard drinks  . Drug use: No  . Sexual activity: Not on file  Lifestyle  . Physical activity:    Days per week: Not on file    Minutes per session: Not on file  . Stress: Not on file  Relationships  . Social connections:    Talks on phone: Not on file    Gets together: Not on file    Attends religious service: Not on file    Active member of club or organization: Not on file    Attends meetings of clubs or organizations: Not on file    Relationship status: Not on file  . Intimate partner violence:    Fear of current or ex partner: Not on file    Emotionally abused: Not on file    Physically abused: Not on file    Forced sexual activity: Not on file  Other Topics Concern  . Not on file  Social History Narrative   Widower as of 08/2015, has one son.   Orig from [place], [place].   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   [employer] education.   Tob 30  pack-yr hx, quit 1984.   Alcohol-none    Drugso-none   Likes to hunt and fish.  Still mows yard with push mower.    Family History  Problem Relation Age of Onset  . Arthritis Mother   . Hypertension Father     Review of Systems:  As stated in the HPI and otherwise negative.   BP (!) 158/62  Pulse 88   Ht 5' 11.65" (1.82 m)   Wt 169 lb 6.4 oz (76.8 kg)   SpO2 97%   BMI 23.20 kg/m   Physical Examination: General: Well developed, well nourished, NAD  HEENT: OP clear, mucus membranes moist  SKIN: warm, dry. No rashes. Neuro: No focal deficits  Musculoskeletal: Muscle strength 5/5 all ext  Psychiatric: Mood and affect normal  Neck: No JVD, no carotid bruits, no thyromegaly, no lymphadenopathy.  Lungs:Clear bilaterally, no wheezes, rhonci, crackles Cardiovascular: Regular rate and rhythm. Loud harsh systolic murmur.  Abdomen:Soft. Bowel sounds present. Non-tender.  Extremities: No lower extremity edema. Pulses are 2 + in the bilateral DP/PT.  Echo 10/05/15: Left ventricle: Systolic function was normal. The estimated  ejection fraction was in the range of 60% to 65%. The study is  not technically sufficient to allow evaluation of LV diastolic  function. - Aortic valve: AV is thickened, calcified with restricted motion.  Peak and mean gradients through the valve are 96 and 55 mm Hg  respectively consistent with severe to critical AS. There is mild  AI. SInce report of echo from 2016, mean gradient has increased  significantly - Left atrium: The atrium was mildly dilated.  EKG:  EKG is ordered today. The ekg ordered today demonstrates atrial flutter, rate 88 bpm. Non-specific T wave abn  Recent Labs: 08/21/2017: ALT 12 10/27/2017: BUN 29; Creatinine, Ser 2.11; Hemoglobin 16.4; Platelets 128.0; Potassium 4.6; Sodium 137   Lipid Panel    Component Value Date/Time   CHOL 109 01/18/2017 0845   TRIG 68.0 01/18/2017 0845   HDL 31.50 (L) 01/18/2017 0845   CHOLHDL 3  01/18/2017 0845   VLDL 13.6 01/18/2017 0845   LDLCALC 64 01/18/2017 0845     Wt Readings from Last 3 Encounters:  01/31/18 169 lb 6.4 oz (76.8 kg)  11/23/17 172 lb 9.6 oz (78.3 kg)  10/27/17 171 lb 4 oz (77.7 kg)     Other studies Reviewed: Additional studies/ records that were reviewed today include: . Review of the above records demonstrates:    Assessment and Plan:   1. Aortic stenosis: He has severe AS by echo in 2017 but missed his f/u appt in our office last year. He denies any dyspnea, chest pain, dizziness, lower extremity edema, near syncope or syncope. He is very active. He is still mowing his grass with a push mower. Will repeat echo now. Given advanced age, he is not a good candidate for surgical AVR but he would be a candidate for TAVR. He is not interested in considering TAVR right now. I have outlined the symptoms that he should be looking for regarding his aortic stenosis. He will call if he begins to have any of these symptoms.   2. HLD: Followed in primary care. Continue Zetia.   3. Atrial flutter: He is in atrial flutter today. Rate controlled on Toprol. Will continue Eliquis and Toprol.   Current medicines are reviewed at length with the patient today.  The patient does not have concerns regarding medicines.  The following changes have been made:    Labs/ tests ordered today include:   Orders Placed This Encounter  Procedures  . EKG 12-Lead  . ECHOCARDIOGRAM COMPLETE    Disposition:   FU with me in 6 months  Signed, Lauree Chandler, MD 01/31/2018 4:29 PM    [address], [place], [place]  [address] Phone: [phone]; Fax: [phone]

## 2018-02-04 ENCOUNTER — Encounter: Payer: Self-pay | Admitting: Family Medicine

## 2018-02-09 ENCOUNTER — Other Ambulatory Visit: Payer: Self-pay

## 2018-02-09 ENCOUNTER — Ambulatory Visit (HOSPITAL_COMMUNITY): Payer: Medicare Other | Attending: Cardiovascular Disease

## 2018-02-09 DIAGNOSIS — I350 Nonrheumatic aortic (valve) stenosis: Secondary | ICD-10-CM | POA: Insufficient documentation

## 2018-03-23 ENCOUNTER — Encounter: Payer: Self-pay | Admitting: Family Medicine

## 2018-03-23 ENCOUNTER — Ambulatory Visit: Payer: Medicare Other | Admitting: Family Medicine

## 2018-03-23 VITALS — BP 149/75 | HR 69 | Temp 97.6°F | Resp 16 | Ht 71.65 in | Wt 174.1 lb

## 2018-03-23 DIAGNOSIS — D72819 Decreased white blood cell count, unspecified: Secondary | ICD-10-CM

## 2018-03-23 DIAGNOSIS — N184 Chronic kidney disease, stage 4 (severe): Secondary | ICD-10-CM | POA: Diagnosis not present

## 2018-03-23 DIAGNOSIS — D696 Thrombocytopenia, unspecified: Secondary | ICD-10-CM | POA: Diagnosis not present

## 2018-03-23 DIAGNOSIS — I10 Essential (primary) hypertension: Secondary | ICD-10-CM | POA: Diagnosis not present

## 2018-03-23 DIAGNOSIS — E7800 Pure hypercholesterolemia, unspecified: Secondary | ICD-10-CM

## 2018-03-23 DIAGNOSIS — L219 Seborrheic dermatitis, unspecified: Secondary | ICD-10-CM

## 2018-03-23 DIAGNOSIS — Z8639 Personal history of other endocrine, nutritional and metabolic disease: Secondary | ICD-10-CM | POA: Diagnosis not present

## 2018-03-23 DIAGNOSIS — I480 Paroxysmal atrial fibrillation: Secondary | ICD-10-CM

## 2018-03-23 LAB — LIPID PANEL
Cholesterol: 121 mg/dL (ref 0–200)
HDL: 28.4 mg/dL — ABNORMAL LOW (ref >39.00)
LDL Cholesterol: 71 mg/dL (ref 0–99)
NonHDL: 92.82
Total CHOL/HDL Ratio: 4
Triglycerides: 107 mg/dL (ref 0.0–149.0)
VLDL: 21.4 mg/dL (ref 0.0–40.0)

## 2018-03-23 LAB — BASIC METABOLIC PANEL
BUN: 41 mg/dL — ABNORMAL HIGH (ref 6–23)
CO2: 23 mEq/L (ref 19–32)
Calcium: 10.1 mg/dL (ref 8.4–10.5)
Chloride: 107 mEq/L (ref 96–112)
Creatinine, Ser: 2.11 mg/dL — ABNORMAL HIGH (ref 0.40–1.50)
GFR: 31.87 mL/min — ABNORMAL LOW (ref >60.00)
Glucose, Bld: 125 mg/dL — ABNORMAL HIGH (ref 70–99)
Potassium: 5.2 mEq/L — ABNORMAL HIGH (ref 3.5–5.1)
Sodium: 138 mEq/L (ref 135–145)

## 2018-03-23 LAB — CBC WITH DIFFERENTIAL/PLATELET
Basophils Absolute: 0.1 10*3/uL (ref 0.0–0.1)
Basophils Relative: 2.4 % (ref 0.0–3.0)
Eosinophils Absolute: 0.1 10*3/uL (ref 0.0–0.7)
Eosinophils Relative: 4.5 % (ref 0.0–5.0)
HCT: 48.1 % (ref 39.0–52.0)
Hemoglobin: 16.6 g/dL (ref 13.0–17.0)
Lymphocytes Relative: 30 % (ref 12.0–46.0)
Lymphs Abs: 0.7 10*3/uL (ref 0.7–4.0)
MCHC: 34.5 g/dL (ref 30.0–36.0)
MCV: 89.3 fl (ref 78.0–100.0)
Monocytes Absolute: 0.6 10*3/uL (ref 0.1–1.0)
Monocytes Relative: 22.7 % — ABNORMAL HIGH (ref 3.0–12.0)
Neutro Abs: 1 10*3/uL — ABNORMAL LOW (ref 1.4–7.7)
Neutrophils Relative %: 40.4 % — ABNORMAL LOW (ref 43.0–77.0)
Platelets: 167 10*3/uL (ref 150.0–400.0)
RBC: 5.39 Mil/uL (ref 4.22–5.81)
RDW: 14.7 % (ref 11.5–15.5)
WBC: 2.4 10*3/uL — ABNORMAL LOW (ref 4.0–10.5)

## 2018-03-23 MED ORDER — CLOBETASOL PROPIONATE 0.05 % EX CREA: 1.0000 "application " | TOPICAL_CREAM | Freq: Two times a day (BID) | CUTANEOUS | 2 refills | Status: DC

## 2018-03-23 NOTE — Progress Notes (Signed)
OFFICE VISIT  03/23/2018   CC:  Chief Complaint  Patient presents with  . Follow-up    RCI, pt is fasting.   HPI:    Patient is a 84 y.o. Caucasian male who presents for 5 mo f/u PAF/flutter, CRI IV with hx of hyperkalemia, HTN.  He has severe AS, followed by Dr. Angelena Form, has been asymptomatic so he has declined any intervention. Most recent echo 02/09/18: no change (LVEF 60-65%, mild LVH, normal wall motion, severe AS, peak gradient 100, mean 63, AV area0.85cm).  Candidate for TAVR but not surgical AVR. No changes at most recent cardiology f/u 01/31/18.  Interim hx: Feeling NO CP, SOB, palpitations, fatigue,or dizziness.  He does whatever physical activity he wants and has no problems. Spending 50% of his time in [place] taking care of his sister b/c of her poor health.  Has some itchy rash on back of neck, was rx'd clobetasol cream 0.05% by an UC MD and it helped. This was around 01/2018.  Says it is itching again.    Tries to stay well hydrated.  He avoids NSAID and ASA.  Taking zetia daily for hyperchol, takes this daily.  Taking eliquis: no melena, hematochezia, or nosebleeds or excessive bruising. Past Medical History:  Diagnosis Date  . Anemia of chronic renal failure   . Atrial flutter ([place]) 10/2014   started on low dose eliquis by cardiologist  . BPH (benign prostatic hyperplasia)   . Chronic leukopenia    Neutropenia, monocytosis also with chronic thrombocytopenia--Dr. Marin Olp following; stable as of 03/16/16 f/u.  Marland Kitchen Chronic renal insufficiency, stage 4 (severe) (HCC)    Nephrosclerosis.  +Proteinuria.  GFR stable at 26 ml/min (sCr 2.21) as of 12/2017 neph f/u.  ?nephrotic syndrome in 75s?--no old records. Dr. Jimmy Footman, 2018, renal u/s- medical renal dz/Nephrosclerosis per renal (no RAS).  Cr 2.47/GFR 23 on 02/13/17 neph f/u.  K 5.5.  Marland Kitchen Colon polyps    Colonoscopy x 3 per pt--initial one with polyp but two 5 yr f/u colonoscopies normal per pt.  . Depression   .  Gout    Doing ok since off allopurinol 2015  . H/O exercise stress test    about 5 yrs. ago, saw cardiologist, had normal stress test, told NO need  to f/u /w cardiologist   . History of hyperkalemia 07/2016   cutting ACE-I dose in half helped this normalize.  +Recurrence 09/2016 (K 5.7 on neph f/u labs).  Elevated again 01/2017--started pt on scheduled kayexalate.  . Hyperkalemia    Due to CKD stage 4 (improved when ACE-I d/c'd).  . Hyperlipidemia   . Hypertension    takes meds daily  . Microhematuria    Renal u/s 08/2013 remarkable only for small nonobstructing stone in each kidney.  Urol feels like this is likely the source of the microhem, cystoscopy likely low yield--following stones with q49mo ultrasounds and office f/u ([facility])  . Severe aortic stenosis    cardiologist recommended TAVR 09/2015 and again 01/2016--pt declines this procedure b/c he is asymptomatic. Rpt echo planned as of 01/31/18.  . Subclinical hypothyroidism 06/2014   TSH 5.2, normal T4 and T3.  Marland Kitchen UTI (urinary tract infection) 02/2016   MSSA    Past Surgical History:  Procedure Laterality Date  . COLONOSCOPY    . EYE SURGERY     cataract - right  . INGUINAL HERNIA REPAIR  12/27/2011   Procedure: HERNIA REPAIR INGUINAL ADULT;  Surgeon: Odis Hollingshead, MD;  Location: [facility];  Service:  General;  Laterality: Right;  . PROSTATE SURGERY     TURP 1980s/90s.--HELPED.  . TONSILLECTOMY    . TRANSTHORACIC ECHOCARDIOGRAM  07/22/14; 09/2015   EF 55-60%, AS worse than prior echo: mean gradient 44, peak >100: f/u 3 mo per cardiologist.  2017 f/u echo showed progression of AS to severe-to-critical.    Outpatient Medications Prior to Visit  Medication Sig Dispense Refill  . amitriptyline (ELAVIL) 50 MG tablet TAKE 1 TABLET BY MOUTH EVERYDAY AT BEDTIME 90 tablet 3  . amLODipine (NORVASC) 5 MG tablet Take 1 tablet by mouth daily.    Marland Kitchen apixaban (ELIQUIS) 2.5 MG TABS tablet Take 1 tablet by mouth twice a day. 180 tablet 3  .  ezetimibe (ZETIA) 10 MG tablet Take 1 tablet (10 mg total) by mouth daily. 90 tablet 2  . metoprolol succinate (TOPROL-XL) 50 MG 24 hr tablet Take 1 tablet (50 mg total) by mouth daily. 90 tablet 2  . tamsulosin (FLOMAX) 0.4 MG CAPS capsule Take 1 capsule (0.4 mg total) by mouth daily. 90 capsule 2   No facility-administered medications prior to visit.     No Known Allergies  ROS As per HPI  PE: Blood pressure (!) 149/75, pulse 69, temperature 97.6 F (36.4 C), temperature source Oral, resp. rate 16, height 5' 11.65" (1.82 m), weight 174 lb 2 oz (79 kg), SpO2 97 %. Gen: Alert, well appearing.  Patient is oriented to person, place, time, and situation. AFFECT: pleasant, lucid thought and speech. Back of neck and hairline in back shows pinkish, flaky macular rash w/out well defined borders.  No vesicals or pustules. CV: irreg irreg, 3-4/6 syst murmur, S1 obscured, S2 clear, no diastolic murmur, no rub. Chest is clear, no wheezing or rales. Normal symmetric air entry throughout both lung fields. No chest wall deformities or tenderness. EXT: no clubbing or cyanosis.  no edema.    LABS:    Chemistry      Component Value Date/Time   NA 137 10/27/2017 0916   NA 132 (A) 08/21/2017   NA 141 03/17/2016 1044   NA 141 03/16/2016 0955   K 4.6 10/27/2017 0916   K 5.2 No Visible Hemolysis (H) 03/17/2016 1044   K 5.8 No visable hemolysis, repeated to verify (H) 03/16/2016 0955   CL 103 10/27/2017 0916   CL 112 (H) 03/17/2016 1044   CO2 28 10/27/2017 0916   CO2 21 03/17/2016 1044   CO2 20 (L) 03/16/2016 0955   BUN 29 (H) 10/27/2017 0916   BUN 43 (A) 08/21/2017   BUN 44 (H) 03/17/2016 1044   BUN 45.8 (H) 03/16/2016 0955   CREATININE 2.11 (H) 10/27/2017 0916   CREATININE 2.4 (H) 03/17/2016 1044   CREATININE 2.6 (H) 03/16/2016 0955   GLU 98 08/21/2017      Component Value Date/Time   CALCIUM 10.0 10/27/2017 0916   CALCIUM 9.7 03/17/2016 1044   CALCIUM 10.4 03/16/2016 0955   ALKPHOS 89  08/21/2017   ALKPHOS 89 03/16/2016 0955   AST 16 08/21/2017   AST 20 03/16/2016 0955   ALT 12 08/21/2017   ALT 17 03/16/2016 0955   BILITOT 1.4 (H) 01/18/2017 0845   BILITOT 0.93 03/16/2016 0955     Lab Results  Component Value Date   WBC 1.6 Repeated and verified X2. (LL) 10/27/2017   HGB 16.4 10/27/2017   HCT 47.2 10/27/2017   MCV 88.1 10/27/2017   PLT 128.0 (L) 10/27/2017   Lab Results  Component Value Date   CHOL  109 01/18/2017   HDL 31.50 (L) 01/18/2017   LDLCALC 64 01/18/2017   TRIG 68.0 01/18/2017   CHOLHDL 3 01/18/2017    IMPRESSION AND PLAN:  1) PAF: toprol xl 50mg  qd for rate control + eliquis 2.5mg  bid. CBC today.  2) CRI IV with hx of hyperkalema,  Avoids NSAIDs.  Hydrates adequately. BMET today.  3) HTN: The current medical regimen is effective;  continue present plan and medications. Lytes/cr today.  4) Severe AS: asymptomatic.  He is candidate for TAVR but he declines b/c he feels well. Most recent echo 01/2018 no change.  Followed by cardiology regularly.  5) Hyperipidemia: tolerating zetia.  Check FLp today.  6) Seborrheic dermatitis back of neck/ocip hairline: clobetasol 0.05% cream eRx'd.  An After Visit Summary was printed and given to the patient.  FOLLOW UP: 4 mo  Signed:  Crissie Sickles, MD           03/23/2018

## 2018-04-25 ENCOUNTER — Other Ambulatory Visit: Payer: Self-pay | Admitting: Family Medicine

## 2018-05-15 LAB — BASIC METABOLIC PANEL
BUN: 28 — AB (ref 4–21)
Creatinine: 2 — AB (ref 0.6–1.3)
Glucose: 136
Potassium: 4.8 (ref 3.4–5.3)
Sodium: 139 (ref 137–147)

## 2018-05-15 LAB — HEPATIC FUNCTION PANEL
ALT: 88 — AB (ref 10–40)
AST: 20 (ref 14–40)
Alkaline Phosphatase: 88 (ref 25–125)
Bilirubin, Total: 1.3

## 2018-05-17 ENCOUNTER — Encounter: Payer: Self-pay | Admitting: Family Medicine

## 2018-07-09 ENCOUNTER — Ambulatory Visit: Payer: Medicare Other | Admitting: Physician Assistant

## 2018-07-20 ENCOUNTER — Ambulatory Visit (INDEPENDENT_AMBULATORY_CARE_PROVIDER_SITE_OTHER): Payer: Medicare Other | Admitting: Family Medicine

## 2018-07-20 ENCOUNTER — Other Ambulatory Visit: Payer: Self-pay

## 2018-07-20 ENCOUNTER — Encounter: Payer: Self-pay | Admitting: Family Medicine

## 2018-07-20 VITALS — BP 145/65 | HR 95

## 2018-07-20 DIAGNOSIS — I350 Nonrheumatic aortic (valve) stenosis: Secondary | ICD-10-CM | POA: Diagnosis not present

## 2018-07-20 DIAGNOSIS — I480 Paroxysmal atrial fibrillation: Secondary | ICD-10-CM | POA: Diagnosis not present

## 2018-07-20 DIAGNOSIS — I10 Essential (primary) hypertension: Secondary | ICD-10-CM

## 2018-07-20 DIAGNOSIS — N184 Chronic kidney disease, stage 4 (severe): Secondary | ICD-10-CM | POA: Diagnosis not present

## 2018-07-20 NOTE — Progress Notes (Signed)
OFFICE VISIT  07/20/2018   CC:  Chief Complaint  Patient presents with  . Follow-up    RCI     HPI:    Patient is a 85 y.o. Caucasian male With whom I am doing a telephone visit today (due to COVID-19 pandemic restrictions). Prior to proceeding with our visit today, the telephone visit process was explained  and pt gave consent to proceed and understood that this process would be used to assess and treat the patient.   This is a 4 mo f/u CRI IV (being followed by Dr. Jimmy Footman), PAF, HTN, HLD, and severe aortic stenosis.  PAF and aortic stenosis:  Most recent echo 01/2018 showed stable AV area and flow gradients--all in severe range. He has historically been completely asymptomatic from a CV standpoint so he has repeatedly declined to have valve replacement.  He continues to deny any dizziness, DOE, CP, or palpitations. No melena, hematochezia, excessive bruising, gross hematuria, or nosebleeds. Next cardiology f/u visit is scheduled for 08/09/2018.  HTN: checking bp weekly : typically 120s-130s over 50s-60s.  CRI IV: he'll be returning to see neph in May this year.  He'll get labs at that visit. sCr has been stable last 3 checks since 10/2017.  HLD: tolerating zetia well.  Past Medical History:  Diagnosis Date  . Anemia of chronic renal failure   . Atrial flutter ([place]) 10/2014   started on low dose eliquis by cardiologist  . BPH (benign prostatic hyperplasia)   . Chronic leukopenia    Neutropenia, monocytosis also with chronic thrombocytopenia--Dr. Marin Olp following; stable as of 03/16/16 f/u.  Marland Kitchen Chronic renal insufficiency, stage 4 (severe) (HCC)    Nephrosclerosis.  +Proteinuria.  GFR stable at 26 ml/min (sCr 2.21) as of 12/2017 neph f/u.  ?nephrotic syndrome in 36s?--no old records. Dr. Jimmy Footman, 2018, renal u/s- medical renal dz/Nephrosclerosis per renal (no RAS).  Cr 2.47/GFR 23 on 02/13/17 neph f/u.  K 5.5.  sCr stable at 2.04, K 4.25 Apr 2018 neph f/u.  Marland Kitchen Colon polyps     Colonoscopy x 3 per pt--initial one with polyp but two 5 yr f/u colonoscopies normal per pt.  . Depression   . Gout    Doing ok since off allopurinol 2015  . H/O exercise stress test    about 5 yrs. ago, saw cardiologist, had normal stress test, told NO need  to f/u /w cardiologist   . History of hyperkalemia 07/2016   cutting ACE-I dose in half helped this normalize.  +Recurrence 09/2016 (K 5.7 on neph f/u labs).  Elevated again 01/2017--started pt on scheduled kayexalate.  . Hyperkalemia    Due to CKD stage 4 (improved when ACE-I d/c'd).  . Hyperlipidemia   . Hypertension    takes meds daily  . Microhematuria    Renal u/s 08/2013 remarkable only for small nonobstructing stone in each kidney.  Urol feels like this is likely the source of the microhem, cystoscopy likely low yield--following stones with q44mo ultrasounds and office f/u ([facility])  . Severe aortic stenosis    cardiologist recommended TAVR 09/2015 and again 01/2016--pt declines this procedure b/c he is asymptomatic. Rpt echo 01/2018 stable AV area and flow gradients (all in severe range).  . Subclinical hypothyroidism 06/2014   TSH 5.2, normal T4 and T3.  Marland Kitchen UTI (urinary tract infection) 02/2016   MSSA    Past Surgical History:  Procedure Laterality Date  . COLONOSCOPY    . EYE SURGERY     cataract - right  .  INGUINAL HERNIA REPAIR  12/27/2011   Procedure: HERNIA REPAIR INGUINAL ADULT;  Surgeon: Odis Hollingshead, MD;  Location: [facility];  Service: General;  Laterality: Right;  . PROSTATE SURGERY     TURP 1980s/90s.--HELPED.  . TONSILLECTOMY    . TRANSTHORACIC ECHOCARDIOGRAM  07/22/14; 09/2015; 02/09/18   EF 55-60%, AS worse than prior echo: mean gradient 44, peak >100.  2017 f/u echo showed progression of AS to severe-to-critical. 01/2018 stable-->LVEF 60-65%, mild LVH, normal wall motion, severe AS, peak gradient 100, mean 63, AV area0.85cm    Outpatient Medications Prior to Visit  Medication Sig Dispense Refill  .  amitriptyline (ELAVIL) 50 MG tablet TAKE 1 TABLET BY MOUTH EVERYDAY AT BEDTIME 90 tablet 3  . amLODipine (NORVASC) 5 MG tablet Take 1 tablet by mouth daily.    Marland Kitchen apixaban (ELIQUIS) 2.5 MG TABS tablet Take 1 tablet by mouth twice a day. 180 tablet 3  . clobetasol cream (TEMOVATE) 1.91 % Apply 1 application topically 2 (two) times daily. 30 g 2  . ezetimibe (ZETIA) 10 MG tablet TAKE 1 TABLET BY MOUTH EVERY DAY 90 tablet 1  . metoprolol succinate (TOPROL-XL) 50 MG 24 hr tablet TAKE 1 TABLET BY MOUTH EVERY DAY 90 tablet 1  . tamsulosin (FLOMAX) 0.4 MG CAPS capsule TAKE 1 CAPSULE BY MOUTH EVERY DAY 90 capsule 1   No facility-administered medications prior to visit.     No Known Allergies  ROS As per HPI  PE: Blood pressure (!) 145/65, pulse 95. Gen: Alert, NAD by voice/interaction on phone.  Patient is oriented to person, place, time, and situation. Lucid thought and speech.   LABS:  Lab Results  Component Value Date   TSH 3.82 07/19/2016   Lab Results  Component Value Date   WBC 2.4 (L) 03/23/2018   HGB 16.6 03/23/2018   HCT 48.1 03/23/2018   MCV 89.3 03/23/2018   PLT 167.0 03/23/2018   Lab Results  Component Value Date   CREATININE 2.0 (A) 05/15/2018   BUN 28 (A) 05/15/2018   NA 139 05/15/2018   K 4.8 05/15/2018   CL 107 03/23/2018   CO2 23 03/23/2018   Lab Results  Component Value Date   ALT 88 (A) 05/15/2018   AST 20 05/15/2018   ALKPHOS 88 05/15/2018   BILITOT 1.4 (H) 01/18/2017   Lab Results  Component Value Date   CHOL 121 03/23/2018   Lab Results  Component Value Date   HDL 28.40 (L) 03/23/2018   Lab Results  Component Value Date   LDLCALC 71 03/23/2018   Lab Results  Component Value Date   TRIG 107.0 03/23/2018   Lab Results  Component Value Date   CHOLHDL 4 03/23/2018     IMPRESSION AND PLAN:  1) HTN: The current medical regimen is effective;  continue present plan and medications. Next lytes/cr will be done via Dr. Jimmy Footman in  May.  2) PAF: asymptomatic.  No sign of bleeding. Continue toprol xl and eliquis.  3) Severe aortic stenosis: he remains asymptomatic and his most recent echo showed stability in AV area and gradients. He has f/u with cardiology 08/09/2018.  4) CRI IV: avoids NSAIDs/nephrotoxic drugs.  Eliquis renal- dosed appropriately. Maintain good fluid intake.  5) HLD: tolerating zetia.  Lipid panel 03/2018 excellent. Plan repeat [place] 03/2019.  Spent 15 min with pt today, with >50% of this time spent in counseling and care coordination regarding the above problems.  An After Visit Summary was printed and given to  the patient.  FOLLOW UP: Return in about 4 months (around 11/19/2018) for annual CPE (fasting).  Signed:  Crissie Sickles, MD           07/20/2018

## 2018-07-31 ENCOUNTER — Telehealth: Payer: Self-pay | Admitting: Physician Assistant

## 2018-07-31 NOTE — Telephone Encounter (Signed)
    Chart Scrub Note  Richard Sullivan is currently scheduled for in-person visit on 08/09/18 (6 month f/u). Due to the recent COVID-19 pandemic, we are transitioning in-person office visits to tele-medicine visits in an effort to decrease non-essential exposure to our patients, their families, and staff. This patient has been deemed a candidate to change their previously scheduled office visit to virtual visit.   Please call patient:  1. Inform them we will be conducting visit via tele-health, instead of coming to the office physically in person. 2. Please discuss doing telephone versus video visit. If the patient has a smartphone, video visit is preferred method. Note:  I will be using Doximity video dialer instead of Webex. There is no prep needed on the patient's side. This merely involves the patient receiving a text at the time of their visit to launch the video (on a smartphone). The workflow would be for my assist to call the patient on the phone 15 mins prior to visit to get vitals and meds, then I will send the patient a text via mobile phone to launch the video. If the patient decides to proceed with video visit, please let him know the plan. 3. When contact is confirmed, please change appointment type to Virtual Office Visit and indicate appointment type in notes. 4. Can review consent at time of this phone call when contact is established or 2-3 days prior to visit (dotphrase is hcevisitprecall).  If the patient has any urgent concerns, please send me back any pertinent information and we will decide whether we need to transition them to the doctor-of-the-day's schedule in person.  Thank you!   Dayna Dunn PA-C

## 2018-08-01 NOTE — Telephone Encounter (Signed)
Patient set up for MyChart?  declined  Is patient using Smartphone/computer/tablet? Does not have  Did audio/video work? n/a   Does patient need telephone visit? Yes   Best phone number to use? [phone]  Special Instructions? Patient gave verbal consent - does not have smartphone or computer     Virtual Visit Pre-Appointment Phone Call  Steps For Call:  1. Confirm consent - "In the setting of the current Covid19 crisis, you are scheduled for a (phone or video) visit with your provider on (08/09/18) at (1030a).  Just as we do with many in-office visits, in order for you to participate in this visit, we must obtain consent.  If you'd like, I can send this to your mychart (if signed up) or email for you to review.  Otherwise, I can obtain your verbal consent now.  All virtual visits are billed to your insurance company just like a normal visit would be.  By agreeing to a virtual visit, we'd like you to understand that the technology does not allow for your provider to perform an examination, and thus may limit your provider's ability to fully assess your condition.  Finally, though the technology is pretty good, we cannot assure that it will always work on either your or our end, and in the setting of a video visit, we may have to convert it to a phone-only visit.  In either situation, we cannot ensure that we have a secure connection.  Are you willing to proceed?" STAFF: Did the patient verbally acknowledge consent to telehealth visit? Document YES/NO here:  Yes  2. Confirm the BEST phone number to call the day of the visit by including in appointment notes  3. Give patient instructions for WebEx/MyChart download to smartphone as below or Doximity/Doxy.me if video visit (depending on what platform provider is using)  4. Advise patient to be prepared with their blood pressure, heart rate, weight, any heart rhythm information, their current medicines, and a piece of paper and pen handy for  any instructions they may receive the day of their visit  5. Inform patient they will receive a phone call 15 minutes prior to their appointment time (may be from unknown caller ID) so they should be prepared to answer  6. Confirm that appointment type is correct in Epic appointment notes (VIDEO vs PHONE)     TELEPHONE CALL NOTE  Richard Sullivan has been deemed a candidate for a follow-up tele-health visit to limit community exposure during the Covid-19 pandemic. I spoke with the patient via phone to ensure availability of phone/video source, confirm preferred email & phone number, and discuss instructions and expectations.  I reminded Richard Sullivan to be prepared with any vital sign and/or heart rhythm information that could potentially be obtained via home monitoring, at the time of his visit. I reminded Richard Sullivan to expect a phone call at the time of his visit if his visit.  [name] 08/01/2018 9:12 AM   INSTRUCTIONS FOR DOWNLOADING THE WEBEX APP TO SMARTPHONE  - If Apple, ask patient to go to App Store and type in WebEx in the search bar. [employer], the blue/green circle. If Android, go to [employer] and type in [employer] in the search bar. The app is free but as with any other app downloads, their phone may require them to verify saved payment information or Apple/Android password.  - The patient does NOT have to create an account. - On the day  of the visit, the assist will walk the patient through joining the meeting with the meeting number/password.  INSTRUCTIONS FOR DOWNLOADING THE MYCHART APP TO SMARTPHONE  - The patient must first make sure to have activated MyChart and know their login information - If Apple, go to [employer] and type in MyChart in the search bar and download the app. If Android, ask patient to go to [employer] and type in [place] in the search bar and download the app. The app is free but as with any other  app downloads, their phone may require them to verify saved payment information or Apple/Android password.  - The patient will need to then log into the app with their MyChart username and password, and select [facility] as their healthcare provider to link the account. When it is time for your visit, go to the MyChart app, find appointments, and click Begin Video Visit. Be sure to Select Allow for your device to access the Microphone and Camera for your visit. You will then be connected, and your provider will be with you shortly.  **If they have any issues connecting, or need assistance please contact MyChart service desk [phone])**  **If using a computer, in order to ensure the best quality for their visit they will need to use either of the following Internet Browsers: [employer], or Google Chrome**  IF USING DOXIMITY or DOXY.ME - The patient will receive a link just prior to their visit, either by text or email (to be determined day of appointment depending on if it's doxy.me or Doximity).     FULL LENGTH CONSENT FOR TELE-HEALTH VISIT   I hereby voluntarily request, consent and authorize [facility] and its employed or contracted physicians, physician assistants, nurse practitioners or other licensed health care professionals (the Practitioner), to provide me with telemedicine health care services (the Services") as deemed necessary by the treating Practitioner. I acknowledge and consent to receive the Services by the Practitioner via telemedicine. I understand that the telemedicine visit will involve communicating with the Practitioner through live audiovisual communication technology and the disclosure of certain medical information by electronic transmission. I acknowledge that I have been given the opportunity to request an in-person assessment or other available alternative prior to the telemedicine visit and am voluntarily participating in the telemedicine  visit.  I understand that I have the right to withhold or withdraw my consent to the use of telemedicine in the course of my care at any time, without affecting my right to future care or treatment, and that the Practitioner or I may terminate the telemedicine visit at any time. I understand that I have the right to inspect all information obtained and/or recorded in the course of the telemedicine visit and may receive copies of available information for a reasonable fee.  I understand that some of the potential risks of receiving the Services via telemedicine include:   Delay or interruption in medical evaluation due to technological equipment failure or disruption;  Information transmitted may not be sufficient (e.g. poor resolution of images) to allow for appropriate medical decision making by the Practitioner; and/or   In rare instances, security protocols could fail, causing a breach of personal health information.  Furthermore, I acknowledge that it is my responsibility to provide information about my medical history, conditions and care that is complete and accurate to the best of my ability. I acknowledge that Practitioner's advice, recommendations, and/or decision may be based on factors not within their control,  such as incomplete or inaccurate data provided by me or distortions of diagnostic images or specimens that may result from electronic transmissions. I understand that the practice of medicine is not an exact science and that Practitioner makes no warranties or guarantees regarding treatment outcomes. I acknowledge that I will receive a copy of this consent concurrently upon execution via email to the email address I last provided but may also request a printed copy by calling the office of [facility].    I understand that my insurance will be billed for this visit.   I have read or had this consent read to me.  I understand the contents of this consent, which adequately explains  the benefits and risks of the Services being provided via telemedicine.   I have been provided ample opportunity to ask questions regarding this consent and the Services and have had my questions answered to my satisfaction.  I give my informed consent for the services to be provided through the use of telemedicine in my medical care  By participating in this telemedicine visit I agree to the above.

## 2018-08-07 ENCOUNTER — Telehealth: Payer: Self-pay | Admitting: Cardiovascular Disease

## 2018-08-07 NOTE — Telephone Encounter (Signed)
°  4/21 :  Spoke with patient regarding appointment being changed from [facility] on 08/09/2018 at 10:30am to Dr. Angelena Form on 08/09/2018 at 11:20am. Patient agreed with appointment being changed.

## 2018-08-09 ENCOUNTER — Encounter: Payer: Self-pay | Admitting: Cardiovascular Disease

## 2018-08-09 ENCOUNTER — Other Ambulatory Visit: Payer: Self-pay

## 2018-08-09 ENCOUNTER — Telehealth (INDEPENDENT_AMBULATORY_CARE_PROVIDER_SITE_OTHER): Payer: Medicare Other | Admitting: Cardiovascular Disease

## 2018-08-09 VITALS — BP 129/61 | HR 59 | Ht 71.65 in | Wt 170.0 lb

## 2018-08-09 DIAGNOSIS — I483 Typical atrial flutter: Secondary | ICD-10-CM

## 2018-08-09 DIAGNOSIS — E7800 Pure hypercholesterolemia, unspecified: Secondary | ICD-10-CM

## 2018-08-09 DIAGNOSIS — I350 Nonrheumatic aortic (valve) stenosis: Secondary | ICD-10-CM | POA: Diagnosis not present

## 2018-08-09 NOTE — Patient Instructions (Signed)
Medication Instructions:  Your physician recommends that you continue on your current medications as directed. Please refer to the Current Medication list given to you today.  If you need a refill on your cardiac medications before your next appointment, please call your pharmacy.   Lab work: None Ordered   Testing/Procedures: Your physician has requested that you have an echocardiogram on Tuesday October 6 at 10:30 am . Echocardiography is a painless test that uses sound waves to create images of your heart. It provides your doctor with information about the size and shape of your heart and how well your heart's chambers and valves are working. This procedure takes approximately one hour. There are no restrictions for this procedure. **If this appointment date/time is not convenient for you, please call us to reschedule.    Follow-Up: At [facility], you and your health needs are our priority.  As part of our continuing mission to provide you with exceptional heart care, we have created designated Provider Care Teams.  These Care Teams include your primary Cardiologist (physician) and Advanced Practice Providers (APPs -  Physician Assistants and Nurse Practitioners) who all work together to provide you with the care you need, when you need it. You will need a follow up appointment in 6 months.  Please call our office 2 months in advance to schedule this appointment.  You may see Lauree Chandler, MD or one of the following Advanced Practice Providers on your designated Care Team:   Arial, PA-C Melina Copa, PA-C . Ermalinda Barrios, PA-C **Your appointment with Dr. Angelena Form can be on the same day as your echo or any day after.

## 2018-08-09 NOTE — Progress Notes (Signed)
Virtual Visit via Video Note   This visit type was conducted due to national recommendations for restrictions regarding the COVID-19 Pandemic (e.g. social distancing) in an effort to limit this patient's exposure and mitigate transmission in our community.  Due to his co-morbid illnesses, this patient is at least at moderate risk for complications without adequate follow up.  This format is felt to be most appropriate for this patient at this time.  All issues noted in this document were discussed and addressed.  A limited physical exam was performed with this format.  Please refer to the patient's chart for his consent to telehealth for [facility]   Evaluation Performed:  Follow-up visit  Date:  08/09/2018   ID:  Richard Sullivan, DOB 06/13/1930, MRN [id number]  Patient Location: Home Provider Location: Home  PCP:  Tammi Sou, MD  Cardiologist:  Lauree Chandler, MD  Electrophysiologist:  None   Chief Complaint:  Follow up-AS/atrial flutter  History of Present Illness:    Richard Sullivan is a 85 y.o. male with history of HTN, HLD, CKD, leukopenia, thrombocytopenic, atrial flutter and aortic stenosis who is being seen today by virtual e-visit due to the [place] pandemic. I saw him in June 2015 for evaluation of aortic stenosis. Echo 08/21/13 in primary care with moderate to severe AS (mean gradient 37 mm Hg). At our first visit in 2015 he had no symptoms and has remained asymptomatic since then. Repeat echo 10/05/15 with progression of aortic stenosis. (mean gradient 55 mmHg, peak gradient 96 mmHg). He was found to be in atrial flutter at the office visit in July 2016 and was started on Eliquis.  He is followed in oncology for his leukopenia and thrombocytopenia. I last saw him in October 2019 and we discussed TAVR but he did not wish to consider at that time since he had no symptoms. Echo October 2019 with severe AS, normal LV systolic function.   He tells me today  that he feels great. He has been mowing his grass. No dyspnea, chest pain, palpitations, lack of energy or dizziness.   The patient does not have symptoms concerning for COVID-19 infection (fever, chills, cough, or new shortness of breath).    Past Medical History:  Diagnosis Date  . Anemia of chronic renal failure   . Atrial flutter ([place]) 10/2014   started on low dose eliquis by cardiologist  . BPH (benign prostatic hyperplasia)   . Chronic leukopenia    Neutropenia, monocytosis also with chronic thrombocytopenia--Dr. Marin Olp following; stable as of 03/16/16 f/u.  Marland Kitchen Chronic renal insufficiency, stage 4 (severe) (HCC)    Nephrosclerosis.  +Proteinuria.  GFR stable at 26 ml/min (sCr 2.21) as of 12/2017 neph f/u.  ?nephrotic syndrome in 16s?--no old records. Dr. Jimmy Footman, 2018, renal u/s- medical renal dz/Nephrosclerosis per renal (no RAS).  Cr 2.47/GFR 23 on 02/13/17 neph f/u.  K 5.5.  sCr stable at 2.04, K 4.25 Apr 2018 neph f/u.  Marland Kitchen Colon polyps    Colonoscopy x 3 per pt--initial one with polyp but two 5 yr f/u colonoscopies normal per pt.  . Depression   . Gout    Doing ok since off allopurinol 2015  . H/O exercise stress test    about 5 yrs. ago, saw cardiologist, had normal stress test, told NO need  to f/u /w cardiologist   . History of hyperkalemia 07/2016   cutting ACE-I dose in half helped this normalize.  +Recurrence 09/2016 (K 5.7 on  neph f/u labs).  Elevated again 01/2017--started pt on scheduled kayexalate.  . Hyperkalemia    Due to CKD stage 4 (improved when ACE-I d/c'd).  . Hyperlipidemia   . Hypertension    takes meds daily  . Microhematuria    Renal u/s 08/2013 remarkable only for small nonobstructing stone in each kidney.  Urol feels like this is likely the source of the microhem, cystoscopy likely low yield--following stones with q37moultrasounds and office f/u ([facility])  . Severe aortic stenosis    cardiologist recommended TAVR 09/2015 and again 01/2016--pt declines  this procedure b/c he is asymptomatic. Rpt echo 01/2018 stable AV area and flow gradients (all in severe range).  . Subclinical hypothyroidism 06/2014   TSH 5.2, normal T4 and T3.  .Marland KitchenUTI (urinary tract infection) 02/2016   MSSA   Past Surgical History:  Procedure Laterality Date  . COLONOSCOPY    . EYE SURGERY     cataract - right  . INGUINAL HERNIA REPAIR  12/27/2011   Procedure: HERNIA REPAIR INGUINAL ADULT;  Surgeon: TOdis Hollingshead MD;  Location: [facility]  Service: General;  Laterality: Right;  . PROSTATE SURGERY     TURP 1980s/90s.--HELPED.  . TONSILLECTOMY    . TRANSTHORACIC ECHOCARDIOGRAM  07/22/14; 09/2015; 02/09/18   EF 55-60%, AS worse than prior echo: mean gradient 44, peak >100.  2017 f/u echo showed progression of AS to severe-to-critical. 01/2018 stable-->LVEF 60-65%, mild LVH, normal wall motion, severe AS, peak gradient 100, mean 63, AV area0.85cm     Current Meds  Medication Sig  . amitriptyline (ELAVIL) 50 MG tablet TAKE 1 TABLET BY MOUTH EVERYDAY AT BEDTIME  . amLODipine (NORVASC) 5 MG tablet Take 1 tablet by mouth daily.  .Marland Kitchenapixaban (ELIQUIS) 2.5 MG TABS tablet Take 1 tablet by mouth twice a day.  . clobetasol cream (TEMOVATE) 02.75% Apply 1 application topically 2 (two) times daily.  .Marland Kitchenezetimibe (ZETIA) 10 MG tablet TAKE 1 TABLET BY MOUTH EVERY DAY  . metoprolol succinate (TOPROL-XL) 50 MG 24 hr tablet TAKE 1 TABLET BY MOUTH EVERY DAY  . tamsulosin (FLOMAX) 0.4 MG CAPS capsule TAKE 1 CAPSULE BY MOUTH EVERY DAY     Allergies:   Patient has no known allergies.   Social History   Tobacco Use  . Smoking status: Former Smoker    Packs/day: 1.00    Years: 32.00    Pack years: 32.00    Types: Cigarettes    Last attempt to quit: 12/05/1982    Years since quitting: 35.7  . Smokeless tobacco: Never Used  Substance Use Topics  . Alcohol use: No    Alcohol/week: 0.0 standard drinks  . Drug use: No     Family Hx: The patient's family history includes Arthritis in  his mother; Hypertension in his father.  ROS:   Please see the history of present illness.    All other systems reviewed and are negative.   Prior CV studies:   The following studies were reviewed today:  Echo October 2019: Left ventricle: The cavity size was normal. Wall thickness was   increased in a pattern of mild LVH. Systolic function was normal.   The estimated ejection fraction was in the range of 60% to 65%.   Wall motion was normal; there were no regional wall motion   abnormalities. The study is not technically sufficient to allow   evaluation of LV diastolic function. - Aortic valve: Calcified leaflets with severe stenosis. There was   mild  regurgitation. Mean gradient (S): 63 mm Hg. Peak gradient   (S): 100 mm Hg. Valve area (VTI): 0.74 cm^2. Valve area (Vmax):   0.75 cm^2. Valve area (Vmean): 0.85 cm^2. - Mitral valve: MAC. Mild thickening, trivial regurgitation. - Left atrium: Moderately dilated. - Tricuspid valve: There was trivial regurgitation. - Pulmonary arteries: PA peak pressure: 30 mm Hg (S). - Inferior vena cava: The vessel was normal in size. The   respirophasic diameter changes were in the normal range (>= 50%),   consistent with normal central venous pressure.  Impressions:  - Atrial flutter is noted. LVEF 60-65%, mild LVH, normal wall   motion, severe aortic stenosis - AVA around 0.8 cm2 - mean   gradient of 63 mmHg, trace to mild MR, moderate LAE, trivial TR,   RVSP 30 mmHg, normal IVC.  Labs/Other Tests and Data Reviewed:    EKG:  No ECG reviewed.  Recent Labs: 03/23/2018: Hemoglobin 16.6; Platelets 167.0 05/15/2018: ALT 88; BUN 28; Creatinine 2.0; Potassium 4.8; Sodium 139   Recent Lipid Panel Lab Results  Component Value Date/Time   CHOL 121 03/23/2018 09:09 AM   TRIG 107.0 03/23/2018 09:09 AM   HDL 28.40 (L) 03/23/2018 09:09 AM   CHOLHDL 4 03/23/2018 09:09 AM   LDLCALC 71 03/23/2018 09:09 AM    Wt Readings from Last 3 Encounters:   08/09/18 170 lb (77.1 kg)  03/23/18 174 lb 2 oz (79 kg)  01/31/18 169 lb 6.4 oz (76.8 kg)     Objective:    Vital Signs:  BP 129/61   Pulse (!) 59   Ht 5' 11.65" (1.82 m)   Wt 170 lb (77.1 kg)   BMI 23.28 kg/m    No exam. Telephone visit due to issues with webcam.   ASSESSMENT & PLAN:    1. Aortic stenosis: He has severe AS by echo in 2019. He is asymptomatic. He would be a TAVR candidate when he is ready to consider. I have again reviewed the symptoms of severe AS and he will call with onset of symptoms. I will plan an echo in October 2020. I will see him back after that echo.   2. HLD: Followed in primary care. Will continue Zetia.   3. Atrial flutter: No palpitations. Will continue Toprol and Eliquis. Marland Kitchen   COVID-19 Education: The signs and symptoms of COVID-19 were discussed with the patient and how to seek care for testing (follow up with PCP or arrange E-visit).  The importance of social distancing was discussed today.  Time:   Today, I have spent 15  minutes with the patient with telehealth technology discussing the above problems.     Medication Adjustments/Labs and Tests Ordered: Current medicines are reviewed at length with the patient today.  Concerns regarding medicines are outlined above.   Tests Ordered: Orders Placed This Encounter  Procedures  . ECHOCARDIOGRAM COMPLETE    Medication Changes: No orders of the defined types were placed in this encounter.   Disposition:  Follow up in 6 month(s)  Signed, Lauree Chandler, MD  08/09/2018 11:41 AM    [place]

## 2018-08-27 ENCOUNTER — Encounter: Payer: Self-pay | Admitting: Family Medicine

## 2018-10-16 ENCOUNTER — Other Ambulatory Visit: Payer: Self-pay | Admitting: Family Medicine

## 2018-10-16 ENCOUNTER — Other Ambulatory Visit: Payer: Self-pay

## 2018-10-16 MED ORDER — METOPROLOL SUCCINATE ER 50 MG PO TB24: 50.0000 mg | ORAL_TABLET | Freq: Every day | ORAL | 1 refills | Status: DC

## 2018-10-16 MED ORDER — AMITRIPTYLINE HCL 50 MG PO TABS: ORAL_TABLET | ORAL | 1 refills | Status: DC

## 2018-10-17 ENCOUNTER — Other Ambulatory Visit: Payer: Self-pay | Admitting: Family Medicine

## 2018-11-22 ENCOUNTER — Ambulatory Visit (INDEPENDENT_AMBULATORY_CARE_PROVIDER_SITE_OTHER): Payer: Medicare Other | Admitting: Family Medicine

## 2018-11-22 ENCOUNTER — Other Ambulatory Visit: Payer: Self-pay

## 2018-11-22 ENCOUNTER — Encounter: Payer: Self-pay | Admitting: Family Medicine

## 2018-11-22 ENCOUNTER — Telehealth: Payer: Self-pay

## 2018-11-22 VITALS — BP 151/71 | HR 74 | Temp 98.0°F | Resp 16 | Ht 71.65 in | Wt 172.8 lb

## 2018-11-22 DIAGNOSIS — N184 Chronic kidney disease, stage 4 (severe): Secondary | ICD-10-CM | POA: Diagnosis not present

## 2018-11-22 DIAGNOSIS — E785 Hyperlipidemia, unspecified: Secondary | ICD-10-CM | POA: Diagnosis not present

## 2018-11-22 DIAGNOSIS — D631 Anemia in chronic kidney disease: Secondary | ICD-10-CM

## 2018-11-22 DIAGNOSIS — I10 Essential (primary) hypertension: Secondary | ICD-10-CM | POA: Diagnosis not present

## 2018-11-22 DIAGNOSIS — Z0000 Encounter for general adult medical examination without abnormal findings: Secondary | ICD-10-CM

## 2018-11-22 LAB — COMPREHENSIVE METABOLIC PANEL
ALT: 9 U/L (ref 0–53)
AST: 15 U/L (ref 0–37)
Albumin: 3.9 g/dL (ref 3.5–5.2)
Alkaline Phosphatase: 68 U/L (ref 39–117)
BUN: 28 mg/dL — ABNORMAL HIGH (ref 6–23)
CO2: 25 mEq/L (ref 19–32)
Calcium: 10.2 mg/dL (ref 8.4–10.5)
Chloride: 105 mEq/L (ref 96–112)
Creatinine, Ser: 2.24 mg/dL — ABNORMAL HIGH (ref 0.40–1.50)
GFR: 27.94 mL/min — ABNORMAL LOW (ref >60.00)
Glucose, Bld: 97 mg/dL (ref 70–99)
Potassium: 5.4 mEq/L — ABNORMAL HIGH (ref 3.5–5.1)
Sodium: 136 mEq/L (ref 135–145)
Total Bilirubin: 1.4 mg/dL — ABNORMAL HIGH (ref 0.2–1.2)
Total Protein: 6.8 g/dL (ref 6.0–8.3)

## 2018-11-22 LAB — CBC WITH DIFFERENTIAL/PLATELET
Basophils Absolute: 0 10*3/uL (ref 0.0–0.1)
Basophils Relative: 2.4 % (ref 0.0–3.0)
Eosinophils Absolute: 0.1 10*3/uL (ref 0.0–0.7)
Eosinophils Relative: 3.5 % (ref 0.0–5.0)
HCT: 47.9 % (ref 39.0–52.0)
Hemoglobin: 16 g/dL (ref 13.0–17.0)
Lymphocytes Relative: 43.9 % (ref 12.0–46.0)
Lymphs Abs: 0.7 10*3/uL (ref 0.7–4.0)
MCHC: 33.5 g/dL (ref 30.0–36.0)
MCV: 90 fl (ref 78.0–100.0)
Monocytes Absolute: 0.4 10*3/uL (ref 0.1–1.0)
Monocytes Relative: 26.1 % — ABNORMAL HIGH (ref 3.0–12.0)
Neutro Abs: 0.4 10*3/uL — ABNORMAL LOW (ref 1.4–7.7)
Neutrophils Relative %: 24.1 % — ABNORMAL LOW (ref 43.0–77.0)
Platelets: 122 10*3/uL — ABNORMAL LOW (ref 150.0–400.0)
RBC: 5.32 Mil/uL (ref 4.22–5.81)
RDW: 14.4 % (ref 11.5–15.5)
WBC: 1.6 10*3/uL — CL (ref 4.0–10.5)

## 2018-11-22 LAB — LIPID PANEL
Cholesterol: 118 mg/dL (ref 0–200)
HDL: 30 mg/dL — ABNORMAL LOW (ref >39.00)
LDL Cholesterol: 71 mg/dL (ref 0–99)
NonHDL: 88.27
Total CHOL/HDL Ratio: 4
Triglycerides: 88 mg/dL (ref 0.0–149.0)
VLDL: 17.6 mg/dL (ref 0.0–40.0)

## 2018-11-22 NOTE — Progress Notes (Signed)
Office Note 11/22/2018  CC:  Chief Complaint  Patient presents with  . Annual Exam    pt is fasting    HPI:  Richard Sullivan is a 85 y.o. White male who is here for annual health maintenance exam.  BP consistently <130/80 at home still.   Active as per his usual. No SOB, CP, presyncope/syncope. Mows yard.  Trying to drink lots of water in the heat.  He last saw his cardiologist 08/09/18 (Dr. Angelena Form) for his severe aortic stenosis and atrial flutter.  Pt w/out any sx's at that time.  No management changes were made. He is candidate for TAVR when he is ready. Plan is for repeat echo 01/2019.   Past Medical History:  Diagnosis Date  . Anemia of chronic renal failure   . Atrial flutter ([place]) 10/2014   started on low dose eliquis by cardiologist  . BPH (benign prostatic hyperplasia)   . Chronic leukopenia    Neutropenia, monocytosis also with chronic thrombocytopenia--Dr. Marin Olp following; stable as of 03/16/16 f/u.  Marland Kitchen Chronic renal insufficiency, stage 4 (severe) (HCC)    Nephrosclerosis.  +Proteinuria.  GFR stable at 26 ml/min (sCr 2.21) as of 12/2017 neph f/u.  ?nephrotic syndrome in 84s?--no old records. Dr. Jimmy Footman, 2018, renal u/s- medical renal dz/Nephrosclerosis per renal (no RAS).  Cr 2.47/GFR 23 on 02/13/17 neph f/u.  K 5.5.  sCr stable at 2.04, K 4.25 Apr 2018 neph f/u.  Marland Kitchen Colon polyps    Colonoscopy x 3 per pt--initial one with polyp but two 5 yr f/u colonoscopies normal per pt.  . Depression   . Gout    Doing ok since off allopurinol 2015  . H/O exercise stress test    about 5 yrs. ago, saw cardiologist, had normal stress test, told NO need  to f/u /w cardiologist   . History of hyperkalemia 07/2016   cutting ACE-I dose in half helped this normalize.  +Recurrence 09/2016 (K 5.7 on neph f/u labs).  Elevated again 01/2017--started pt on scheduled kayexalate.  . Hyperkalemia    Due to CKD stage 4 (improved when ACE-I d/c'd).  . Hyperlipidemia   . Hypertension     takes meds daily  . Microhematuria    Renal u/s 08/2013 remarkable only for small nonobstructing stone in each kidney.  Urol feels like this is likely the source of the microhem, cystoscopy likely low yield--following stones with q35mo ultrasounds and office f/u ([facility])  . Severe aortic stenosis    cardiologist recommended TAVR 09/2015 and again 01/2016--pt declines this procedure b/c he is asymptomatic. Rpt echo 01/2018 stable AV area and flow gradients (all in severe range). Next echo 01/2019 per cards.  . Subclinical hypothyroidism 06/2014   TSH 5.2, normal T4 and T3.  Marland Kitchen UTI (urinary tract infection) 02/2016   MSSA    Past Surgical History:  Procedure Laterality Date  . COLONOSCOPY    . EYE SURGERY     cataract - right  . INGUINAL HERNIA REPAIR  12/27/2011   Procedure: HERNIA REPAIR INGUINAL ADULT;  Surgeon: Odis Hollingshead, MD;  Location: [facility];  Service: General;  Laterality: Right;  . PROSTATE SURGERY     TURP 1980s/90s.--HELPED.  . TONSILLECTOMY    . TRANSTHORACIC ECHOCARDIOGRAM  07/22/14; 09/2015; 02/09/18   EF 55-60%, AS worse than prior echo: mean gradient 44, peak >100.  2017 f/u echo showed progression of AS to severe-to-critical. 01/2018 stable-->LVEF 60-65%, mild LVH, normal wall motion, severe AS, peak gradient 100, mean 63,  AV area0.85cm    Family History  Problem Relation Age of Onset  . Arthritis Mother   . Hypertension Father     Social History   Socioeconomic History  . Marital status: Widowed    Spouse name: Not on file  . Number of children: 1  . Years of education: Not on file  . Highest education level: Not on file  Occupational History  . Occupation: Retired  Scientific laboratory technician  . Financial resource strain: Not on file  . Food insecurity    Worry: Not on file    Inability: Not on file  . Transportation needs    Medical: Not on file    Non-medical: Not on file  Tobacco Use  . Smoking status: Former Smoker    Packs/day: 1.00    Years: 32.00     Pack years: 32.00    Types: Cigarettes    Quit date: 12/05/1982    Years since quitting: 35.9  . Smokeless tobacco: Never Used  Substance and Sexual Activity  . Alcohol use: No    Alcohol/week: 0.0 standard drinks  . Drug use: No  . Sexual activity: Not on file  Lifestyle  . Physical activity    Days per week: Not on file    Minutes per session: Not on file  . Stress: Not on file  Relationships  . Social Herbalist on phone: Not on file    Gets together: Not on file    Attends religious service: Not on file    Active member of club or organization: Not on file    Attends meetings of clubs or organizations: Not on file    Relationship status: Not on file  . Intimate partner violence    Fear of current or ex partner: Not on file    Emotionally abused: Not on file    Physically abused: Not on file    Forced sexual activity: Not on file  Other Topics Concern  . Not on file  Social History Narrative   Widower as of 08/2015, has one son.   Orig from [place], [place].   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   [employer] education.   Tob 30 pack-yr hx, quit 1984.   Alcohol-none    Drugso-none   Likes to hunt and fish.  Still mows yard with push mower.    Outpatient Medications Prior to Visit  Medication Sig Dispense Refill  . amitriptyline (ELAVIL) 50 MG tablet TAKE 1 TABLET BY MOUTH EVERYDAY AT BEDTIME 90 tablet 1  . amLODipine (NORVASC) 5 MG tablet Take 1 tablet by mouth daily.    Marland Kitchen apixaban (ELIQUIS) 2.5 MG TABS tablet Take 1 tablet by mouth twice a day. 180 tablet 3  . clobetasol cream (TEMOVATE) [id number] % Apply 1 application topically 2 (two) times daily. 30 g 2  . ezetimibe (ZETIA) 10 MG tablet TAKE 1 TABLET BY MOUTH EVERY DAY 90 tablet 1  . metoprolol succinate (TOPROL-XL) 50 MG 24 hr tablet Take 1 tablet (50 mg total) by mouth daily. 90 tablet 1  . tamsulosin (FLOMAX) 0.4 MG CAPS capsule TAKE 1 CAPSULE BY MOUTH EVERY DAY 90 capsule 1   No facility-administered medications  prior to visit.     No Known Allergies  ROS Review of Systems  Constitutional: Negative for appetite change, chills, fatigue and fever.  HENT: Negative for congestion, dental problem, ear pain and sore throat.   Eyes: Negative for discharge, redness and visual disturbance.  Respiratory:  Negative for cough, chest tightness, shortness of breath and wheezing.   Cardiovascular: Negative for chest pain, palpitations and leg swelling.  Gastrointestinal: Negative for abdominal pain, blood in stool, diarrhea, nausea and vomiting.  Genitourinary: Negative for difficulty urinating, dysuria, flank pain, frequency, hematuria and urgency.  Musculoskeletal: Negative for arthralgias, back pain, joint swelling, myalgias and neck stiffness.  Skin: Negative for pallor and rash.  Neurological: Negative for dizziness, speech difficulty, weakness and headaches.  Hematological: Negative for adenopathy. Does not bruise/bleed easily.  Psychiatric/Behavioral: Negative for confusion and sleep disturbance. The patient is not nervous/anxious.     PE; Blood pressure (!) 151/71, pulse 74, temperature 98 F (36.7 C), temperature source Temporal, resp. rate 16, height 5' 11.65" (1.82 m), weight 172 lb 12.8 oz (78.4 kg), SpO2 97 %. Body mass index is 23.67 kg/m.  Gen: Alert, well appearing.  Patient is oriented to person, place, time, and situation. AFFECT: pleasant, lucid thought and speech. ENT: Ears: EACs clear, normal epithelium.  TMs with good light reflex and landmarks bilaterally.  Eyes: no injection, icteris, swelling, or exudate.  EOMI, PERRLA. Nose: no drainage or turbinate edema/swelling.  No injection or focal lesion.  Mouth: lips without lesion/swelling.  Oral mucosa pink and moist.  Dentition intact and without obvious caries or gingival swelling.  Oropharynx without erythema, exudate, or swelling.  Neck: supple/nontender.  No LAD, mass, or TM.  Carotid pulses 2+ bilaterally, without bruits. CV: Irreg  irreg, 4/6 harsh systolic murmur.  No diastolic murmur.  No r/g.   LUNGS: CTA bilat, nonlabored resps, good aeration in all lung fields. ABD: soft, NT, ND, BS normal.  No hepatospenomegaly or mass.  No bruits. EXT: no clubbing, cyanosis, or edema.  Musculoskeletal: no joint swelling, erythema, warmth, or tenderness.  ROM of all joints intact. Skin - no sores or suspicious lesions or rashes or color changes   Pertinent labs:  Lab Results  Component Value Date   TSH 3.82 07/19/2016   Lab Results  Component Value Date   WBC 2.4 (L) 03/23/2018   HGB 16.6 03/23/2018   HCT 48.1 03/23/2018   MCV 89.3 03/23/2018   PLT 167.0 03/23/2018   Lab Results  Component Value Date   CREATININE 2.0 (A) 05/15/2018   BUN 28 (A) 05/15/2018   NA 139 05/15/2018   K 4.8 05/15/2018   CL 107 03/23/2018   CO2 23 03/23/2018   Lab Results  Component Value Date   ALT 88 (A) 05/15/2018   AST 20 05/15/2018   ALKPHOS 88 05/15/2018   BILITOT 1.4 (H) 01/18/2017   Lab Results  Component Value Date   CHOL 121 03/23/2018   Lab Results  Component Value Date   HDL 28.40 (L) 03/23/2018   Lab Results  Component Value Date   LDLCALC 71 03/23/2018   Lab Results  Component Value Date   TRIG 107.0 03/23/2018   Lab Results  Component Value Date   CHOLHDL 4 03/23/2018     ASSESSMENT AND PLAN:   Health maintenance exam: Reviewed age and gender appropriate health maintenance issues (prudent diet, regular exercise, health risks of tobacco and excessive alcohol, use of seatbelts, fire alarms in home, use of sunscreen).  Also reviewed age and gender appropriate health screening as well as vaccine recommendations. Vaccines: all UTD. Labs: CBC w/diff, CMET, FLP (CRI IV, chronic leukemia, anemia of CRI, HTN, HLD). Prostate ca screening: aged out. Colon ca screening: aged out.  An After Visit Summary was printed and given to the patient.  FOLLOW UP:  Return in about 6 months (around 05/25/2019) for routine  chronic illness f/u.  Signed:  Crissie Sickles, MD           11/22/2018

## 2018-11-22 NOTE — Telephone Encounter (Signed)
CRITICAL VALUE STICKER  CRITICAL VALUE:  RECEIVER Doran Clay, CMA  DATE & TIME NOTIFIED:8.6.20 @ 330 pm  Marijean Bravo   MD NOTIFIED: [place]   Critical WBC @ 1.6

## 2018-11-22 NOTE — Patient Instructions (Signed)

## 2018-11-23 ENCOUNTER — Other Ambulatory Visit: Payer: Self-pay

## 2018-11-23 DIAGNOSIS — E875 Hyperkalemia: Secondary | ICD-10-CM

## 2018-11-23 DIAGNOSIS — N184 Chronic kidney disease, stage 4 (severe): Secondary | ICD-10-CM

## 2018-11-23 NOTE — Telephone Encounter (Signed)
Noted.  Long hx of leukopenia.

## 2018-11-29 ENCOUNTER — Other Ambulatory Visit: Payer: Self-pay

## 2018-11-29 ENCOUNTER — Ambulatory Visit (INDEPENDENT_AMBULATORY_CARE_PROVIDER_SITE_OTHER): Payer: Medicare Other

## 2018-11-29 DIAGNOSIS — E875 Hyperkalemia: Secondary | ICD-10-CM | POA: Diagnosis not present

## 2018-11-29 DIAGNOSIS — N184 Chronic kidney disease, stage 4 (severe): Secondary | ICD-10-CM

## 2018-11-29 LAB — BASIC METABOLIC PANEL
BUN: 30 mg/dL — ABNORMAL HIGH (ref 6–23)
CO2: 21 mEq/L (ref 19–32)
Calcium: 10 mg/dL (ref 8.4–10.5)
Chloride: 106 mEq/L (ref 96–112)
Creatinine, Ser: 2.3 mg/dL — ABNORMAL HIGH (ref 0.40–1.50)
GFR: 27.1 mL/min — ABNORMAL LOW (ref >60.00)
Glucose, Bld: 99 mg/dL (ref 70–99)
Potassium: 5.2 mEq/L — ABNORMAL HIGH (ref 3.5–5.1)
Sodium: 136 mEq/L (ref 135–145)

## 2019-01-22 ENCOUNTER — Other Ambulatory Visit (HOSPITAL_COMMUNITY): Payer: Medicare Other

## 2019-01-25 ENCOUNTER — Other Ambulatory Visit: Payer: Self-pay | Admitting: Cardiovascular Disease

## 2019-01-25 NOTE — Telephone Encounter (Signed)
Pt last saw Dr Angelena Form 08/09/18 telemedicine visit Covid-19, last labs 11/22/18 Creat 2.24, age 85, weight 78.4kg, based on specified criteria pt is on appropriate dosage of Eliquis 2.5mg  BID.  Will refill rx.

## 2019-02-08 ENCOUNTER — Encounter: Payer: Self-pay | Admitting: Physician Assistant

## 2019-02-08 NOTE — Progress Notes (Signed)
Cardiology Office Note    Date:  02/11/2019   ID:  KARDEN SCHNEEKLOTH, DOB 1931-02-28, MRN [id number]  PCP:  Tammi Sou, MD  Cardiologist:  Lauree Chandler, MD  Electrophysiologist:  None   Chief Complaint: f/u aortic stenosis and atrial flutter  History of Present Illness:   Richard Sullivan is a 85 y.o. male with history of persistent atrial flutter, aortic stenosis, HTN, HLD, CKD stage IV, hyperkalemia, leukopenia, thrombocytopenia, anemia, subclinical hypothyroidism who presents for 6 month follow-up.  He has history of aortic stenosis, followed by Dr. Angelena Form. He also was found to be in atrial flutter at the office in July 2016 and was started on Eliquis.He has been followed in oncology for his leukopenia and thrombocytopenia. Last echo 02/09/18 showed mild LVH, EF 60-65%, severe aortic stenosis, mild aortic regurgitation, trace to mild mitral regurgitation, moderate left atrial enlargement, RVSP 82mmHg. Repeat echo was done prior to this appointment and is pending. TAVR was previously discussed but the patient has declined to pursue this in the past given that he's been generally asymptomatic. Last labs 11/2018 showed K 5.2, BUN 30, Cr 2.3, normal LFTs, WBC 1.6, Hgb 16, Plt 122, LDL 71. His hyperkalemia has been managed by primary care with low potassium diet. He is also followed by [facility] and recently switched to a male doctor when Dr Deterding retired - he thinks the name Dr. Moshe Cipro sounds familiar. He was in atrial flutter by EKG last year.  He is seen back today and continues to affirm he's been generally asymptomatic and stable from cardiac standpoint. He states "I'm not like I'm 22 again, but I am able push mow and do all our plumbing without any problems." He is also able to walk through [employer] to the pharmacy without any functional limitation beyond his baseline. Denies CP, SOB, fluid retention, palpitations, pre-syncope, syncope,  orthopnea or bleeding.   Past Medical History:  Diagnosis Date  . Anemia of chronic renal failure   . Atrial flutter (HCC)    persistent, on anticoagulation  . BPH (benign prostatic hyperplasia)   . Chronic leukopenia    Neutropenia, monocytosis also with chronic thrombocytopenia--Dr. Marin Olp following; stable as of 03/16/16 f/u.  Marland Kitchen Chronic renal insufficiency, stage 4 (severe) (HCC)    Nephrosclerosis.  +Proteinuria.  GFR stable at 26 ml/min (sCr 2.21) as of 12/2017 neph f/u.  ?nephrotic syndrome in 68s?--no old records. Dr. Jimmy Footman, 2018, renal u/s- medical renal dz/Nephrosclerosis per renal (no RAS).  Cr 2.47/GFR 23 on 02/13/17 neph f/u.  K 5.5.  sCr stable at 2.04, K 4.25 Apr 2018 neph f/u.  Marland Kitchen Colon polyps    Colonoscopy x 3 per pt--initial one with polyp but two 5 yr f/u colonoscopies normal per pt.  . Depression   . Gout    Doing ok since off allopurinol 2015  . H/O exercise stress test    about 5 yrs. ago, saw cardiologist, had normal stress test, told NO need  to f/u /w cardiologist   . History of hyperkalemia 07/2016   cutting ACE-I dose in half helped this normalize.  +Recurrence 09/2016 (K 5.7 on neph f/u labs).  Elevated again 01/2017--started pt on scheduled kayexalate.  . Hyperkalemia    Due to CKD stage 4 (improved when ACE-I d/c'd).  . Hyperlipidemia   . Hypertension    takes meds daily  . Leukopenia   . Microhematuria    Renal u/s 08/2013 remarkable only for small nonobstructing stone in each kidney.  Urol feels like this is likely the source of the microhem, cystoscopy likely low yield--following stones with q1mo ultrasounds and office f/u ([facility])  . Severe aortic stenosis    cardiologist recommended TAVR 09/2015 and again 01/2016--pt declines this procedure b/c he is asymptomatic. Rpt echo 01/2018 stable AV area and flow gradients (all in severe range). Next echo 01/2019 per cards.  . Subclinical hypothyroidism 06/2014   TSH 5.2, normal T4 and T3.  .  Thrombocytopenia ([place])   . UTI (urinary tract infection) 02/2016   MSSA    Past Surgical History:  Procedure Laterality Date  . COLONOSCOPY    . EYE SURGERY     cataract - right  . INGUINAL HERNIA REPAIR  12/27/2011   Procedure: HERNIA REPAIR INGUINAL ADULT;  Surgeon: Odis Hollingshead, MD;  Location: [facility];  Service: General;  Laterality: Right;  . PROSTATE SURGERY     TURP 1980s/90s.--HELPED.  . TONSILLECTOMY    . TRANSTHORACIC ECHOCARDIOGRAM  07/22/14; 09/2015; 02/09/18   EF 55-60%, AS worse than prior echo: mean gradient 44, peak >100.  2017 f/u echo showed progression of AS to severe-to-critical. 01/2018 stable-->LVEF 60-65%, mild LVH, normal wall motion, severe AS, peak gradient 100, mean 63, AV area0.85cm    Current Medications: Current Meds  Medication Sig  . amitriptyline (ELAVIL) 50 MG tablet TAKE 1 TABLET BY MOUTH EVERYDAY AT BEDTIME  . amLODipine (NORVASC) 5 MG tablet Take 1 tablet by mouth daily.  Marland Kitchen apixaban (ELIQUIS) 2.5 MG TABS tablet Take 1 tablet (2.5 mg total) by mouth 2 (two) times daily.  . clobetasol cream (TEMOVATE) [id number] % Apply 1 application topically 2 (two) times daily.  Marland Kitchen ezetimibe (ZETIA) 10 MG tablet TAKE 1 TABLET BY MOUTH EVERY DAY  . metoprolol succinate (TOPROL-XL) 50 MG 24 hr tablet Take 1 tablet (50 mg total) by mouth daily.  . tamsulosin (FLOMAX) 0.4 MG CAPS capsule TAKE 1 CAPSULE BY MOUTH EVERY DAY  . [DISCONTINUED] ELIQUIS 2.5 MG TABS tablet TAKE 1 TABLET BY MOUTH TWICE A DAY     Allergies:   Patient has no known allergies.   Social History   Socioeconomic History  . Marital status: Widowed    Spouse name: Not on file  . Number of children: 1  . Years of education: Not on file  . Highest education level: Not on file  Occupational History  . Occupation: Retired  Scientific laboratory technician  . Financial resource strain: Not on file  . Food insecurity    Worry: Not on file    Inability: Not on file  . Transportation needs    Medical: Not on file     Non-medical: Not on file  Tobacco Use  . Smoking status: Former Smoker    Packs/day: 1.00    Years: 32.00    Pack years: 32.00    Types: Cigarettes    Quit date: 12/05/1982    Years since quitting: 36.2  . Smokeless tobacco: Never Used  Substance and Sexual Activity  . Alcohol use: No    Alcohol/week: 0.0 standard drinks  . Drug use: No  . Sexual activity: Not on file  Lifestyle  . Physical activity    Days per week: Not on file    Minutes per session: Not on file  . Stress: Not on file  Relationships  . Social Herbalist on phone: Not on file    Gets together: Not on file    Attends religious service: Not on file  Active member of club or organization: Not on file    Attends meetings of clubs or organizations: Not on file    Relationship status: Not on file  Other Topics Concern  . Not on file  Social History Narrative   Widower as of 08/2015, has one son.   Orig from [place], [place].   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   [employer] education.   Tob 30 pack-yr hx, quit 1984.   Alcohol-none    Drugso-none   Likes to hunt and fish.  Still mows yard with push mower.     Family History:  The patient's family history includes Arthritis in his mother; Hypertension in his father.  ROS:   Please see the history of present illness.  All other systems are reviewed and otherwise negative.    EKGs/Labs/Other Studies Reviewed:    Studies reviewed were summarized above.   EKG:  EKG is ordered today, personally reviewed, demonstrating atrial flutter 76bpm variable AV block, nonspecific STT changes, left axis deviation  Recent Labs: 11/22/2018: ALT 9; Hemoglobin 16.0; Platelets 122.0 11/29/2018: BUN 30; Creatinine, Ser 2.30; Potassium 5.2 No hemolysis seen; Sodium 136  Recent Lipid Panel    Component Value Date/Time   CHOL 118 11/22/2018 0858   TRIG 88.0 11/22/2018 0858   HDL 30.00 (L) 11/22/2018 0858   CHOLHDL 4 11/22/2018 0858   VLDL 17.6 11/22/2018 0858   LDLCALC  71 11/22/2018 0858    PHYSICAL EXAM:    VS:  BP (!) 150/68   Pulse 76   Ht 5' 11.5" (1.816 m)   Wt 171 lb (77.6 kg)   SpO2 99%   BMI 23.52 kg/m   BMI: Body mass index is 23.52 kg/m.  GEN: Well nourished, well developed M, in no acute distress HEENT: normocephalic, atraumatic Neck: no JVD, carotid bruits, or masses Cardiac: irregularly irregular, rate controlled, 2/6 SEM at RUSB without audible S2. No rubs or gallops Respiratory:  clear to auscultation bilaterally, normal work of breathing GI: soft, nontender, nondistended, + BS MS: no deformity or atrophy Skin: warm and dry, no rash Neuro:  Alert and Oriented x 3, Strength and sensation are intact, follows commands Psych: euthymic mood, full affect  Wt Readings from Last 3 Encounters:  02/11/19 171 lb (77.6 kg)  11/22/18 172 lb 12.8 oz (78.4 kg)  08/09/18 170 lb (77.1 kg)     ASSESSMENT & PLAN:   1. Aortic stenosis - this is known to be severe, but managed conservatively per patient request given that he has been asymptomatic. He continues to decline any further invasive procedures. We discussed whether or not a decline in LV function on his echo would change his mind, and he says it would not. He would only consider TAVR if he developed new symptoms impairing his quality of life. Warning symptoms reviewed. He will notify us if he develops any new symptoms. Echo result is pending and will come to Dr. Camillia Herter box. I will also forward this note to him for his review.  2. Persistent atrial flutter - rate is controlled. OK to refill Eliquis, dose remains appropriate, no bleeding. He has been managed with rate control strategy given that he is asymptomatic. 3. Essential HTN - BP mildly elevated in clinic today but he follows this at home and typically sees readings around [id number] systolic. Given severity of aortic stenosis, I would not aggressively titrate his antihypertensives. Continue to monitor. 4. CKD Stage IV - managed by Dr.  Anitra Lauth and [facility].  We revisited importance of low potassium diet today.  Disposition: F/u with Dr. Angelena Form in 6 months.  Medication Adjustments/Labs and Tests Ordered: Current medicines are reviewed at length with the patient today.  Concerns regarding medicines are outlined above. Medication changes, Labs and Tests ordered today are summarized above and listed in the Patient Instructions accessible in Encounters.   Signed, Charlie Pitter, PA-C  02/11/2019 3:03 PM    [facility] [address], [place], [place]  [address] Phone: [phone]; Fax: [phone]

## 2019-02-11 ENCOUNTER — Encounter: Payer: Self-pay | Admitting: Physician Assistant

## 2019-02-11 ENCOUNTER — Encounter (INDEPENDENT_AMBULATORY_CARE_PROVIDER_SITE_OTHER): Payer: Self-pay

## 2019-02-11 ENCOUNTER — Ambulatory Visit (INDEPENDENT_AMBULATORY_CARE_PROVIDER_SITE_OTHER): Payer: Medicare Other | Admitting: Physician Assistant

## 2019-02-11 ENCOUNTER — Ambulatory Visit (HOSPITAL_COMMUNITY): Payer: Medicare Other | Attending: Cardiovascular Disease

## 2019-02-11 ENCOUNTER — Other Ambulatory Visit: Payer: Self-pay

## 2019-02-11 VITALS — BP 150/68 | HR 76 | Ht 71.5 in | Wt 171.0 lb

## 2019-02-11 DIAGNOSIS — I350 Nonrheumatic aortic (valve) stenosis: Secondary | ICD-10-CM | POA: Diagnosis not present

## 2019-02-11 DIAGNOSIS — I10 Essential (primary) hypertension: Secondary | ICD-10-CM | POA: Diagnosis not present

## 2019-02-11 DIAGNOSIS — N184 Chronic kidney disease, stage 4 (severe): Secondary | ICD-10-CM

## 2019-02-11 DIAGNOSIS — I4892 Unspecified atrial flutter: Secondary | ICD-10-CM

## 2019-02-11 MED ORDER — APIXABAN 2.5 MG PO TABS: 2.5000 mg | ORAL_TABLET | Freq: Two times a day (BID) | ORAL | 3 refills | Status: DC

## 2019-02-11 NOTE — Patient Instructions (Signed)
Medication Instructions:  Your physician recommends that you continue on your current medications as directed. Please refer to the Current Medication list given to you today.  *If you need a refill on your cardiac medications before your next appointment, please call your pharmacy*  Lab Work: None ordered  If you have labs (blood work) drawn today and your tests are completely normal, you will receive your results only by: . MyChart Message (if you have MyChart) OR . A paper copy in the mail If you have any lab test that is abnormal or we need to change your treatment, we will call you to review the results.  Testing/Procedures: None ordered  Follow-Up: At [facility], you and your health needs are our priority.  As part of our continuing mission to provide you with exceptional heart care, we have created designated Provider Care Teams.  These Care Teams include your primary Cardiologist (physician) and Advanced Practice Providers (APPs -  Physician Assistants and Nurse Practitioners) who all work together to provide you with the care you need, when you need it.  Your next appointment:   6 month(s)  The format for your next appointment:   In Person  Provider:   You may see Christopher McAlhany, MD or one of the following Advanced Practice Providers on your designated Care Team:    Dayna Dunn, PA-C  Michele Lenze, PA-C      

## 2019-04-03 ENCOUNTER — Other Ambulatory Visit: Payer: Self-pay | Admitting: Family Medicine

## 2019-05-14 ENCOUNTER — Other Ambulatory Visit: Payer: Self-pay | Admitting: Physician Assistant

## 2019-05-14 NOTE — Telephone Encounter (Signed)
Pt cannot afford Eliquis and would like to know if pt could take warfarin. Please address

## 2019-05-14 NOTE — Telephone Encounter (Signed)
New Message:   Pharmacist called and wanted to know if Richard Sullivan have considered using Warfarin instead of Eliquis? He said pt can not afford the Eliquis.

## 2019-05-15 NOTE — Telephone Encounter (Signed)
That would be fine by me as long as he is aware that Coumadin requires lab monitoring.  He can also inquire to his insurance company if [place] would be preferred instead or forward this to med assistance liaison in office. If he just prefers to go to Coumadin instead, please set up for Coumadin clinic visit. Will also cc to Dr. Angelena Form so he is aware. Nataleah Scioneaux PA-C

## 2019-05-15 NOTE — Telephone Encounter (Signed)
Contacted pt re: request to change from Eliquis to Coumadin.  I did advise him of Melina Copa, PA-C's recommendations, but pt stated that since he made that call, that he would prefer to stay with Eiquis.  Pt stated that he would make sure he came up with the money.  Advised pt I would document in his chart that he will stay with Eliquis.  Pt verbalized understanding and was grateful for the call back.

## 2019-05-15 NOTE — Telephone Encounter (Signed)
Agree. Thanks

## 2019-05-15 NOTE — Telephone Encounter (Signed)
Please address this matter. Thanks

## 2019-05-22 ENCOUNTER — Other Ambulatory Visit: Payer: Self-pay

## 2019-05-24 ENCOUNTER — Ambulatory Visit (INDEPENDENT_AMBULATORY_CARE_PROVIDER_SITE_OTHER): Payer: Medicare Other | Admitting: Family Medicine

## 2019-05-24 ENCOUNTER — Encounter: Payer: Self-pay | Admitting: Family Medicine

## 2019-05-24 ENCOUNTER — Other Ambulatory Visit: Payer: Self-pay

## 2019-05-24 VITALS — BP 162/79 | HR 88 | Temp 97.7°F | Resp 17 | Ht 71.5 in | Wt 176.0 lb

## 2019-05-24 DIAGNOSIS — I350 Nonrheumatic aortic (valve) stenosis: Secondary | ICD-10-CM

## 2019-05-24 DIAGNOSIS — N184 Chronic kidney disease, stage 4 (severe): Secondary | ICD-10-CM

## 2019-05-24 DIAGNOSIS — Z7901 Long term (current) use of anticoagulants: Secondary | ICD-10-CM | POA: Diagnosis not present

## 2019-05-24 DIAGNOSIS — D72819 Decreased white blood cell count, unspecified: Secondary | ICD-10-CM

## 2019-05-24 DIAGNOSIS — I4892 Unspecified atrial flutter: Secondary | ICD-10-CM | POA: Diagnosis not present

## 2019-05-24 DIAGNOSIS — I10 Essential (primary) hypertension: Secondary | ICD-10-CM

## 2019-05-24 LAB — CBC WITH DIFFERENTIAL/PLATELET
Basophils Absolute: 0 10*3/uL (ref 0.0–0.1)
Basophils Relative: 2 % (ref 0.0–3.0)
Eosinophils Absolute: 0.1 10*3/uL (ref 0.0–0.7)
Eosinophils Relative: 4.1 % (ref 0.0–5.0)
HCT: 44.5 % (ref 39.0–52.0)
Hemoglobin: 15.1 g/dL (ref 13.0–17.0)
Lymphocytes Relative: 34.9 % (ref 12.0–46.0)
Lymphs Abs: 0.7 10*3/uL (ref 0.7–4.0)
MCHC: 34 g/dL (ref 30.0–36.0)
MCV: 89.5 fl (ref 78.0–100.0)
Monocytes Absolute: 0.5 10*3/uL (ref 0.1–1.0)
Monocytes Relative: 27 % — ABNORMAL HIGH (ref 3.0–12.0)
Neutro Abs: 0.6 10*3/uL — ABNORMAL LOW (ref 1.4–7.7)
Neutrophils Relative %: 32 % — ABNORMAL LOW (ref 43.0–77.0)
Platelets: 142 10*3/uL — ABNORMAL LOW (ref 150.0–400.0)
RBC: 4.97 Mil/uL (ref 4.22–5.81)
RDW: 14.8 % (ref 11.5–15.5)
WBC: 2 10*3/uL — ABNORMAL LOW (ref 4.0–10.5)

## 2019-05-24 LAB — COMPREHENSIVE METABOLIC PANEL
ALT: 8 U/L (ref 0–53)
AST: 13 U/L (ref 0–37)
Albumin: 3.8 g/dL (ref 3.5–5.2)
Alkaline Phosphatase: 87 U/L (ref 39–117)
BUN: 31 mg/dL — ABNORMAL HIGH (ref 6–23)
CO2: 23 mEq/L (ref 19–32)
Calcium: 10 mg/dL (ref 8.4–10.5)
Chloride: 107 mEq/L (ref 96–112)
Creatinine, Ser: 2.05 mg/dL — ABNORMAL HIGH (ref 0.40–1.50)
GFR: 30.91 mL/min — ABNORMAL LOW (ref >60.00)
Glucose, Bld: 98 mg/dL (ref 70–99)
Potassium: 4.9 mEq/L (ref 3.5–5.1)
Sodium: 136 mEq/L (ref 135–145)
Total Bilirubin: 1.2 mg/dL (ref 0.2–1.2)
Total Protein: 6.9 g/dL (ref 6.0–8.3)

## 2019-05-24 MED ORDER — CLOBETASOL PROPIONATE 0.05 % EX CREA: 1.0000 "application " | TOPICAL_CREAM | Freq: Two times a day (BID) | CUTANEOUS | 2 refills | Status: AC

## 2019-05-24 NOTE — Progress Notes (Signed)
OFFICE VISIT  05/24/2019   CC:  Chief Complaint  Patient presents with  . Hypertension    HPI:    Patient is a 86 y.o. Caucasian male who presents for 6 mo f/u CRI IV (being followed by Dr. Jimmy Footman), PAF, HTN, HLD, and severe aortic stenosis.  Most recent cards f/u 02/11/19: no changes.  Pt wil elect AVR only if he notes marked functional decline.  Interim hx: "I'm doing pretty good". Working around his house as usual.   Home bp monitoring regularly:  Consistently 130/80 or better.   ROS: no fevers, no palpitations, no CP, no SOB, no wheezing, no cough, no dizziness, no HAs, no rashes, no melena/hematochezia.  No polyuria or polydipsia.  No myalgias or arthralgias.   Past Medical History:  Diagnosis Date  . Anemia of chronic renal failure   . Atrial flutter (HCC)    persistent, on anticoagulation  . BPH (benign prostatic hyperplasia)   . Chronic leukopenia    Neutropenia, monocytosis also with chronic thrombocytopenia--Dr. Marin Olp following; stable as of 03/16/16 f/u.  Marland Kitchen Chronic renal insufficiency, stage 4 (severe) (HCC)    Nephrosclerosis.  +Proteinuria.  GFR stable at 26 ml/min (sCr 2.21) as of 12/2017 neph f/u.  ?nephrotic syndrome in 54s?--no old records. Dr. Jimmy Footman, 2018, renal u/s- medical renal dz/Nephrosclerosis per renal (no RAS).  Cr 2.47/GFR 23 on 02/13/17 neph f/u.  K 5.5.  sCr stable at 2.04, K 4.25 Apr 2018 neph f/u.  Marland Kitchen Colon polyps    Colonoscopy x 3 per pt--initial one with polyp but two 5 yr f/u colonoscopies normal per pt.  . Depression   . Gout    Doing ok since off allopurinol 2015  . H/O exercise stress test    about 5 yrs. ago, saw cardiologist, had normal stress test, told NO need  to f/u /w cardiologist   . History of hyperkalemia 07/2016   cutting ACE-I dose in half helped this normalize.  +Recurrence 09/2016 (K 5.7 on neph f/u labs).  Elevated again 01/2017--started pt on scheduled kayexalate.  . Hyperkalemia    Due to CKD stage 4 (improved  when ACE-I d/c'd).  . Hyperlipidemia   . Hypertension    takes meds daily  . Leukopenia   . Microhematuria    Renal u/s 08/2013 remarkable only for small nonobstructing stone in each kidney.  Urol feels like this is likely the source of the microhem, cystoscopy likely low yield--following stones with q21mo ultrasounds and office f/u ([facility])  . Severe aortic stenosis    cardiologist recommended TAVR 09/2015 and again 01/2016--pt declines this procedure b/c he is asymptomatic. Rpt echo 01/2018 stable AV area and flow gradients (all in severe range). Next echo 01/2019 per cards.  . Subclinical hypothyroidism 06/2014   TSH 5.2, normal T4 and T3.  . Thrombocytopenia ([place])   . UTI (urinary tract infection) 02/2016   MSSA    Past Surgical History:  Procedure Laterality Date  . COLONOSCOPY    . EYE SURGERY     cataract - right  . INGUINAL HERNIA REPAIR  12/27/2011   Procedure: HERNIA REPAIR INGUINAL ADULT;  Surgeon: Odis Hollingshead, MD;  Location: [facility];  Service: General;  Laterality: Right;  . PROSTATE SURGERY     TURP 1980s/90s.--HELPED.  . TONSILLECTOMY    . TRANSTHORACIC ECHOCARDIOGRAM  07/22/14; 09/2015; 02/09/18; 01/2019   EF 55-60%, AS worse than prior echo: mean gradient 44, peak >100.  2017 f/u echo showed progression of AS to severe-to-critical. 01/2018  stable-->LVEF 60-65%, mild LVH, normal wall motion, severe AS, peak gradient 100, mean 63, AV area0.85cm. 01/2019 no change.    Outpatient Medications Prior to Visit  Medication Sig Dispense Refill  . allopurinol (ZYLOPRIM) 100 MG tablet Take 100 mg by mouth daily.    Marland Kitchen amitriptyline (ELAVIL) 50 MG tablet TAKE 1 TABLET BY MOUTH EVERYDAY AT BEDTIME 90 tablet 1  . amLODipine (NORVASC) 5 MG tablet Take 1 tablet by mouth daily.    Marland Kitchen apixaban (ELIQUIS) 2.5 MG TABS tablet Take 1 tablet (2.5 mg total) by mouth 2 (two) times daily. 180 tablet 3  . ezetimibe (ZETIA) 10 MG tablet TAKE 1 TABLET BY MOUTH EVERY DAY 90 tablet 1  . metoprolol  succinate (TOPROL-XL) 50 MG 24 hr tablet TAKE 1 TABLET BY MOUTH EVERY DAY 90 tablet 1  . tamsulosin (FLOMAX) 0.4 MG CAPS capsule TAKE 1 CAPSULE BY MOUTH EVERY DAY 90 capsule 1  . clobetasol cream (TEMOVATE) [id number] % Apply 1 application topically 2 (two) times daily. 30 g 2   No facility-administered medications prior to visit.    No Known Allergies  ROS As per HPI  PE: Blood pressure (!) 162/79, pulse 88, temperature 97.7 F (36.5 C), temperature source Temporal, resp. rate 17, height 5' 11.5" (1.816 m), weight 176 lb (79.8 kg), SpO2 99 %. Body mass index is 24.2 kg/m.  Gen: Alert, well appearing.  Patient is oriented to person, place, time, and situation. AFFECT: pleasant, lucid thought and speech. CV: irreg, 2-3/6 syst murmur, no diastolic murmur.  No rub, no gallop. Chest is clear, no wheezing or rales. Normal symmetric air entry throughout both lung fields. No chest wall deformities or tenderness. EXT: no clubbing or cyanosis.  no edema.    LABS:  Lab Results  Component Value Date   TSH 3.82 07/19/2016   Lab Results  Component Value Date   WBC 1.6 Repeated and verified X2. (LL) 11/22/2018   HGB 16.0 11/22/2018   HCT 47.9 11/22/2018   MCV 90.0 11/22/2018   PLT 122.0 (L) 11/22/2018   Lab Results  Component Value Date   CREATININE 2.30 (H) 11/29/2018   BUN 30 (H) 11/29/2018   NA 136 11/29/2018   K 5.2 No hemolysis seen (H) 11/29/2018   CL 106 11/29/2018   CO2 21 11/29/2018   Lab Results  Component Value Date   ALT 9 11/22/2018   AST 15 11/22/2018   ALKPHOS 68 11/22/2018   BILITOT 1.4 (H) 11/22/2018   Lab Results  Component Value Date   CHOL 118 11/22/2018   Lab Results  Component Value Date   HDL 30.00 (L) 11/22/2018   Lab Results  Component Value Date   LDLCALC 71 11/22/2018   Lab Results  Component Value Date   TRIG 88.0 11/22/2018   Lab Results  Component Value Date   CHOLHDL 4 11/22/2018    IMPRESSION AND PLAN:  1) HTN: a bit elevated  here today, but consistently normal at home. No changes today.  Lytes/cr today.  2) CRI IV, hx of hyperkalemia: has been stable. CMET today.  Low K diet emphasized. Followed by renal. Has f/u with renal, now will see Dr. Clover Mealy starting next week.  Dr.  Jimmy Footman retired.  3) Chronic leukopenia. Monitor CBC today.  4) chronic atrial flutter/fib: rate controlled, doing well.  Chronic anticoagulation. CBC monitoring today.  An After Visit Summary was printed and given to the patient.  FOLLOW UP: Return in about 6 months (around 11/21/2019) for annual  CPE (fasting).  Signed:  Crissie Sickles, MD           05/24/2019

## 2019-10-07 ENCOUNTER — Other Ambulatory Visit: Payer: Self-pay | Admitting: Family Medicine

## 2019-10-07 MED ORDER — METOPROLOL SUCCINATE ER 50 MG PO TB24: 50.0000 mg | ORAL_TABLET | Freq: Every day | ORAL | 1 refills | Status: DC

## 2019-10-07 MED ORDER — AMITRIPTYLINE HCL 50 MG PO TABS: ORAL_TABLET | ORAL | 1 refills | Status: DC

## 2019-10-09 ENCOUNTER — Other Ambulatory Visit: Payer: Self-pay | Admitting: Family Medicine

## 2019-12-06 ENCOUNTER — Encounter: Payer: Medicare Other | Admitting: Family Medicine

## 2019-12-10 LAB — BASIC METABOLIC PANEL
BUN: 29 — AB (ref 4–21)
CO2: 23 — AB (ref 13–22)
Chloride: 107 (ref 99–108)
Creatinine: 2.2 — AB (ref 0.6–1.3)
Glucose: 105
Potassium: 5.7 — AB (ref 3.4–5.3)
Sodium: 136 — AB (ref 137–147)

## 2019-12-10 LAB — IRON,TIBC AND FERRITIN PANEL
%SAT: 20
Ferritin: 185
Iron: 58
TIBC: 296
UIBC: 238

## 2019-12-10 LAB — COMPREHENSIVE METABOLIC PANEL
Albumin: 3.9 (ref 3.5–5.0)
Calcium: 9.8 (ref 8.7–10.7)
GFR calc Af Amer: 31
GFR calc non Af Amer: 26

## 2019-12-10 LAB — CBC AND DIFFERENTIAL: Hemoglobin: 15.4 (ref 13.5–17.5)

## 2019-12-13 ENCOUNTER — Ambulatory Visit (INDEPENDENT_AMBULATORY_CARE_PROVIDER_SITE_OTHER): Payer: Medicare Other | Admitting: Family Medicine

## 2019-12-13 ENCOUNTER — Encounter: Payer: Self-pay | Admitting: Family Medicine

## 2019-12-13 ENCOUNTER — Other Ambulatory Visit: Payer: Self-pay

## 2019-12-13 VITALS — BP 158/70 | HR 90 | Temp 98.0°F | Resp 16 | Ht 71.0 in | Wt 177.6 lb

## 2019-12-13 DIAGNOSIS — Z0000 Encounter for general adult medical examination without abnormal findings: Secondary | ICD-10-CM | POA: Diagnosis not present

## 2019-12-13 DIAGNOSIS — I10 Essential (primary) hypertension: Secondary | ICD-10-CM | POA: Diagnosis not present

## 2019-12-13 DIAGNOSIS — Z7901 Long term (current) use of anticoagulants: Secondary | ICD-10-CM

## 2019-12-13 DIAGNOSIS — E7800 Pure hypercholesterolemia, unspecified: Secondary | ICD-10-CM

## 2019-12-13 DIAGNOSIS — I350 Nonrheumatic aortic (valve) stenosis: Secondary | ICD-10-CM

## 2019-12-13 NOTE — Progress Notes (Signed)
Office Note 12/13/2019  CC:  Chief Complaint  Patient presents with  . Annual Exam    pt is fasting    HPI:  Richard Sullivan is a 86 y.o. White male who is here for annual health maintenance exam as well as for f/u CRI IV, HTN, HLD. He has persistent a-fib/flutter and is anticoagulated and rate controlled, also with severe  AS (has been asymptomatic)--is followed by cardiology.  Feeling fine. Push mowing lawn. Hydrating well. Home bp's usually 130s over 70s.  Not checking HR. He saw Dr. Moshe Cipro this week and got labs done.   Past Medical History:  Diagnosis Date  . Anemia of chronic renal failure   . Atrial flutter (HCC)    persistent, on anticoagulation  . BPH (benign prostatic hyperplasia)   . Chronic leukopenia    Neutropenia, monocytosis also with chronic thrombocytopenia--Dr. Marin Olp following; stable as of 03/16/16 f/u.  Marland Kitchen Chronic renal insufficiency, stage 4 (severe) (HCC)    Nephrosclerosis.  +Proteinuria.  GFR stable at 26 ml/min (sCr 2.21) as of 12/2017 neph f/u.  ?nephrotic syndrome in 49s?--no old records. Dr. Jimmy Footman, 2018, renal u/s- medical renal dz/Nephrosclerosis per renal (no RAS).  Cr 2.47/GFR 23 on 02/13/17 neph f/u.  K 5.5.  sCr stable at 2.04, K 4.25 Apr 2018 neph f/u.  Marland Kitchen Colon polyps    Colonoscopy x 3 per pt--initial one with polyp but two 5 yr f/u colonoscopies normal per pt.  . Depression   . Gout    Doing ok since off allopurinol 2015  . H/O exercise stress test    about 5 yrs. ago, saw cardiologist, had normal stress test, told NO need  to f/u /w cardiologist   . History of hyperkalemia 07/2016   cutting ACE-I dose in half helped this normalize.  +Recurrence 09/2016 (K 5.7 on neph f/u labs).  Elevated again 01/2017--started pt on scheduled kayexalate.  . Hyperkalemia    Due to CKD stage 4 (improved when ACE-I d/c'd).  . Hyperlipidemia   . Hypertension    takes meds daily  . Leukopenia   . Microhematuria    Renal u/s 08/2013  remarkable only for small nonobstructing stone in each kidney.  Urol feels like this is likely the source of the microhem, cystoscopy likely low yield--following stones with q68mo ultrasounds and office f/u ([facility])  . Severe aortic stenosis    cardiologist recommended TAVR 09/2015 and again 01/2016--pt declines this procedure b/c he is asymptomatic. Rpt echo 01/2018 stable AV area and flow gradients (all in severe range). Next echo 01/2019 per cards.  . Subclinical hypothyroidism 06/2014   TSH 5.2, normal T4 and T3.  . Thrombocytopenia ([place])   . UTI (urinary tract infection) 02/2016   MSSA    Past Surgical History:  Procedure Laterality Date  . COLONOSCOPY    . EYE SURGERY     cataract - right  . INGUINAL HERNIA REPAIR  12/27/2011   Procedure: HERNIA REPAIR INGUINAL ADULT;  Surgeon: Odis Hollingshead, MD;  Location: [facility];  Service: General;  Laterality: Right;  . PROSTATE SURGERY     TURP 1980s/90s.--HELPED.  . TONSILLECTOMY    . TRANSTHORACIC ECHOCARDIOGRAM  07/22/14; 09/2015; 02/09/18; 01/2019   EF 55-60%, AS worse than prior echo: mean gradient 44, peak >100.  2017 f/u echo showed progression of AS to severe-to-critical. 01/2018 stable-->LVEF 60-65%, mild LVH, normal wall motion, severe AS, peak gradient 100, mean 63, AV area0.85cm. 01/2019 no change.    Family History  Problem  Relation Age of Onset  . Arthritis Mother   . Hypertension Father     Social History   Socioeconomic History  . Marital status: Widowed    Spouse name: Not on file  . Number of children: 1  . Years of education: Not on file  . Highest education level: Not on file  Occupational History  . Occupation: Retired  Tobacco Use  . Smoking status: Former Smoker    Packs/day: 1.00    Years: 32.00    Pack years: 32.00    Types: Cigarettes    Quit date: 12/05/1982    Years since quitting: 37.0  . Smokeless tobacco: Never Used  Vaping Use  . Vaping Use: Never used  Substance and Sexual Activity  . Alcohol  use: No    Alcohol/week: 0.0 standard drinks  . Drug use: No  . Sexual activity: Not on file  Other Topics Concern  . Not on file  Social History Narrative   Widower as of 08/2015, has one son.   Orig from [place], [place].   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   [employer] education.   Tob 30 pack-yr hx, quit 1984.   Alcohol-none    Drugso-none   Likes to hunt and fish.  Still mows yard with push mower.   Social Determinants of Health   Financial Resource Strain:   . Difficulty of Paying Living Expenses: Not on file  Food Insecurity:   . Worried About Charity fundraiser in the Last Year: Not on file  . Ran Out of Food in the Last Year: Not on file  Transportation Needs:   . Lack of Transportation (Medical): Not on file  . Lack of Transportation (Non-Medical): Not on file  Physical Activity:   . Days of Exercise per Week: Not on file  . Minutes of Exercise per Session: Not on file  Stress:   . Feeling of Stress : Not on file  Social Connections:   . Frequency of Communication with Friends and Family: Not on file  . Frequency of Social Gatherings with Friends and Family: Not on file  . Attends Religious Services: Not on file  . Active Member of Clubs or Organizations: Not on file  . Attends Archivist Meetings: Not on file  . Marital Status: Not on file  Intimate Partner Violence:   . Fear of Current or Ex-Partner: Not on file  . Emotionally Abused: Not on file  . Physically Abused: Not on file  . Sexually Abused: Not on file    Outpatient Medications Prior to Visit  Medication Sig Dispense Refill  . allopurinol (ZYLOPRIM) 100 MG tablet Take 100 mg by mouth daily.    Marland Kitchen amitriptyline (ELAVIL) 50 MG tablet Take 1 tablet by mouth everyday at bedtime 90 tablet 1  . amLODipine (NORVASC) 5 MG tablet Take 1 tablet by mouth daily.    Marland Kitchen apixaban (ELIQUIS) 2.5 MG TABS tablet Take 1 tablet (2.5 mg total) by mouth 2 (two) times daily. 180 tablet 3  . clobetasol cream (TEMOVATE)  9.79 % Apply 1 application topically 2 (two) times daily. 30 g 2  . ezetimibe (ZETIA) 10 MG tablet TAKE 1 TABLET BY MOUTH EVERY DAY 90 tablet 1  . metoprolol succinate (TOPROL-XL) 50 MG 24 hr tablet Take 1 tablet (50 mg total) by mouth daily. Take with or immediately following a meal. 90 tablet 1  . tamsulosin (FLOMAX) 0.4 MG CAPS capsule TAKE 1 CAPSULE BY MOUTH EVERY DAY 90  capsule 1   No facility-administered medications prior to visit.    No Known Allergies  ROS Review of Systems  Constitutional: Negative for appetite change, chills, fatigue and fever.  HENT: Negative for congestion, dental problem, ear pain and sore throat.   Eyes: Negative for discharge, redness and visual disturbance.  Respiratory: Negative for cough, chest tightness, shortness of breath and wheezing.   Cardiovascular: Negative for chest pain, palpitations and leg swelling.  Gastrointestinal: Negative for abdominal pain, blood in stool, diarrhea, nausea and vomiting.  Genitourinary: Negative for difficulty urinating, dysuria, flank pain, frequency, hematuria and urgency.  Musculoskeletal: Negative for arthralgias, back pain, joint swelling, myalgias and neck stiffness.  Skin: Negative for pallor and rash.  Neurological: Negative for dizziness, speech difficulty, weakness and headaches.  Hematological: Negative for adenopathy. Does not bruise/bleed easily.  Psychiatric/Behavioral: Negative for confusion and sleep disturbance. The patient is not nervous/anxious.     PE; Vitals with BMI 12/13/2019 05/24/2019 02/11/2019  Height 5\' 11"  5' 11.5" 5' 11.5"  Weight 177 lbs 10 oz 176 lbs 171 lbs  BMI 24.78 27.03 50.09  Systolic 381 829 937  Diastolic 70 79 68  Pulse 90 88 76  O2 sat 98% on RA today  Gen: Alert, well appearing.  Patient is oriented to person, place, time, and situation. AFFECT: pleasant, lucid thought and speech. ENT: Ears: EACs clear, normal epithelium.  TMs with good light reflex and landmarks  bilaterally.  Eyes: no injection, icteris, swelling, or exudate.  EOMI, PERRLA. Nose: no drainage or turbinate edema/swelling.  No injection or focal lesion.  Mouth: lips without lesion/swelling.  Oral mucosa pink and moist.  Dentition intact and without obvious caries or gingival swelling.  Oropharynx without erythema, exudate, or swelling.  Neck: supple/nontender.  No LAD, mass, or TM.  Carotid pulses 2+ bilaterally, without bruits. CV: RRR, no m/r/g.   LUNGS: CTA bilat, nonlabored resps, good aeration in all lung fields. ABD: soft, NT, ND, BS normal.  No hepatospenomegaly or mass.  No bruits. EXT: no clubbing, cyanosis, or edema.  Musculoskeletal: no joint swelling, erythema, warmth, or tenderness.  ROM of all joints intact. Skin - no sores or suspicious lesions or rashes or color changes   Pertinent labs:  Lab Results  Component Value Date   TSH 3.82 07/19/2016   Lab Results  Component Value Date   WBC 2.0 Repeated and verified X2. (L) 05/24/2019   HGB 15.1 05/24/2019   HCT 44.5 05/24/2019   MCV 89.5 05/24/2019   PLT 142.0 (L) 05/24/2019   Lab Results  Component Value Date   CREATININE 2.05 (H) 05/24/2019   BUN 31 (H) 05/24/2019   NA 136 05/24/2019   K 4.9 05/24/2019   CL 107 05/24/2019   CO2 23 05/24/2019   Lab Results  Component Value Date   ALT 8 05/24/2019   AST 13 05/24/2019   ALKPHOS 87 05/24/2019   BILITOT 1.2 05/24/2019   Lab Results  Component Value Date   CHOL 118 11/22/2018   Lab Results  Component Value Date   HDL 30.00 (L) 11/22/2018   Lab Results  Component Value Date   LDLCALC 71 11/22/2018   Lab Results  Component Value Date   TRIG 88.0 11/22/2018   Lab Results  Component Value Date   CHOLHDL 4 11/22/2018    ASSESSMENT AND PLAN:   1) HTN: The current medical regimen is effective;  continue present plan and medications. Will get recent lytes/cr from his nephrologist visit this week.  2) HLD : tolerating zetia.    3) A-fib  w/chronic anticoag.  No sign of bleeding. Get lab results from nephrol visit this week.  4) Severe aortic stenosis: pt remains asymptomatic. Continue cardiology f/u.  5) CRI IV: avoiding NSAIDs.  Trying to hydrate well. Neph f/u routine, recent labs-->will get from that visit.  6) Health maintenance exam: Reviewed age and gender appropriate health maintenance issues (prudent diet, regular exercise, health risks of tobacco and excessive alcohol, use of seatbelts, fire alarms in home, use of sunscreen).  Also reviewed age and gender appropriate health screening as well as vaccine recommendations. Vaccines: All UTD.  Covid 19-->has not had vaccine and doesn't want it. Labs: none today.  Will get most recent labs from nephrologist visit this week. Prostate ca screening: no further screening due to age. Colon ca screening: no further screening due to age.  An After Visit Summary was printed and given to the patient.  FOLLOW UP:  Return in about 6 months (around 06/14/2020) for routine chronic illness f/u.  Signed:  Crissie Sickles, MD           12/13/2019

## 2019-12-20 ENCOUNTER — Encounter: Payer: Self-pay | Admitting: Family Medicine

## 2020-04-05 ENCOUNTER — Other Ambulatory Visit: Payer: Self-pay | Admitting: Family Medicine

## 2020-04-07 ENCOUNTER — Other Ambulatory Visit: Payer: Self-pay | Admitting: Physician Assistant

## 2020-04-07 NOTE — Telephone Encounter (Signed)
Pt last saw Melina Copa, PA on 02/11/19, pt is overdue for follow-up.  Last labs 12/10/19 Creat 2.2, age 86, weight 80.6kg, based on specified criteria pt is on appropriate dosage of Eliquis 2.5mg  BID.  Sent msg to schedulers to call pt to schedule appt, pt was due to see Dr Angelena Form in 6 months after last visit  07/2019.  Will await appt to refill rx.

## 2020-04-08 NOTE — Telephone Encounter (Addendum)
04/08/2020 @ 822am received message from the Scheduling dept: Dionne Bucy, RN Good morning I have spoke to the patient and wanted his apt in April.        Pt has requested his appt for April 6th 2022 at 4pm.   Eliquis 2.5mg  refill request received. Patient is 86 years old, weight-80.6kg, Crea-2.2 on 12/10/2019, Diagnosis-Aflutter, and last seen by Melina Copa on 02/11/2019 and pending appt for April per pt request (1 yr from last appt) and not any sooner. Dose is appropriate based on dosing criteria. Will send in refill to requested pharmacy.

## 2020-06-04 ENCOUNTER — Other Ambulatory Visit: Payer: Self-pay

## 2020-06-05 ENCOUNTER — Ambulatory Visit: Payer: Medicare Other | Admitting: Family Medicine

## 2020-06-05 ENCOUNTER — Encounter: Payer: Self-pay | Admitting: Family Medicine

## 2020-06-05 ENCOUNTER — Telehealth: Payer: Self-pay

## 2020-06-05 VITALS — BP 125/69 | HR 84 | Temp 98.0°F | Resp 16 | Ht 71.0 in | Wt 168.2 lb

## 2020-06-05 DIAGNOSIS — N184 Chronic kidney disease, stage 4 (severe): Secondary | ICD-10-CM

## 2020-06-05 DIAGNOSIS — M10071 Idiopathic gout, right ankle and foot: Secondary | ICD-10-CM

## 2020-06-05 DIAGNOSIS — I480 Paroxysmal atrial fibrillation: Secondary | ICD-10-CM | POA: Diagnosis not present

## 2020-06-05 DIAGNOSIS — E7800 Pure hypercholesterolemia, unspecified: Secondary | ICD-10-CM

## 2020-06-05 DIAGNOSIS — I10 Essential (primary) hypertension: Secondary | ICD-10-CM

## 2020-06-05 DIAGNOSIS — D72810 Lymphocytopenia: Secondary | ICD-10-CM | POA: Diagnosis not present

## 2020-06-05 DIAGNOSIS — Z7901 Long term (current) use of anticoagulants: Secondary | ICD-10-CM

## 2020-06-05 LAB — COMPREHENSIVE METABOLIC PANEL
ALT: 6 U/L (ref 0–53)
AST: 11 U/L (ref 0–37)
Albumin: 3.5 g/dL (ref 3.5–5.2)
Alkaline Phosphatase: 89 U/L (ref 39–117)
BUN: 28 mg/dL — ABNORMAL HIGH (ref 6–23)
CO2: 26 mEq/L (ref 19–32)
Calcium: 9.5 mg/dL (ref 8.4–10.5)
Chloride: 103 mEq/L (ref 96–112)
Creatinine, Ser: 2.61 mg/dL — ABNORMAL HIGH (ref 0.40–1.50)
GFR: 21.41 mL/min — ABNORMAL LOW (ref >60.00)
Glucose, Bld: 91 mg/dL (ref 70–99)
Potassium: 5.3 mEq/L — ABNORMAL HIGH (ref 3.5–5.1)
Sodium: 135 mEq/L (ref 135–145)
Total Bilirubin: 1.3 mg/dL — ABNORMAL HIGH (ref 0.2–1.2)
Total Protein: 6.6 g/dL (ref 6.0–8.3)

## 2020-06-05 LAB — LIPID PANEL
Cholesterol: 111 mg/dL (ref 0–200)
HDL: 31.6 mg/dL — ABNORMAL LOW (ref >39.00)
LDL Cholesterol: 65 mg/dL (ref 0–99)
NonHDL: 79.34
Total CHOL/HDL Ratio: 4
Triglycerides: 70 mg/dL (ref 0.0–149.0)
VLDL: 14 mg/dL (ref 0.0–40.0)

## 2020-06-05 LAB — CBC WITH DIFFERENTIAL/PLATELET
Basophils Absolute: 0 10*3/uL (ref 0.0–0.1)
Basophils Relative: 2.3 % (ref 0.0–3.0)
Eosinophils Absolute: 0.1 10*3/uL (ref 0.0–0.7)
Eosinophils Relative: 3.5 % (ref 0.0–5.0)
HCT: 42.2 % (ref 39.0–52.0)
Hemoglobin: 14.1 g/dL (ref 13.0–17.0)
Lymphocytes Relative: 37.6 % (ref 12.0–46.0)
Lymphs Abs: 0.6 10*3/uL — ABNORMAL LOW (ref 0.7–4.0)
MCHC: 33.4 g/dL (ref 30.0–36.0)
MCV: 85.6 fl (ref 78.0–100.0)
Monocytes Absolute: 0.4 10*3/uL (ref 0.1–1.0)
Monocytes Relative: 28.5 % — ABNORMAL HIGH (ref 3.0–12.0)
Neutro Abs: 0.4 10*3/uL — ABNORMAL LOW (ref 1.4–7.7)
Neutrophils Relative %: 28.1 % — ABNORMAL LOW (ref 43.0–77.0)
Platelets: 186 10*3/uL (ref 150.0–400.0)
RBC: 4.93 Mil/uL (ref 4.22–5.81)
RDW: 15.1 % (ref 11.5–15.5)
WBC: 1.5 10*3/uL — CL (ref 4.0–10.5)

## 2020-06-05 NOTE — Progress Notes (Signed)
OFFICE VISIT  06/05/2020  CC:  Chief Complaint  Patient presents with  . Follow-up    RCI, 6 mo. Pt is fasting    HPI:    Patient is a 87 y.o. Caucasian male who presents for 6 mo f/u CRI IV, HTN, HLD. He has persistent a-fib/flutter and is anticoagulated and rate controlled, also with severe  AS (has been asymptomatic)--is followed by cardiology. A/P as of last visit: "1) HTN: The current medical regimen is effective;  continue present plan and medications. Will get recent lytes/cr from his nephrologist visit this week.  2) HLD : tolerating zetia.    3) A-fib w/chronic anticoag.  No sign of bleeding. Get lab results from nephrol visit this week.  4) Severe aortic stenosis: pt remains asymptomatic. Continue cardiology f/u.  5) CRI IV: avoiding NSAIDs.  Trying to hydrate well. Neph f/u routine, recent labs-->will get from that visit.  6) Health maintenance exam: Reviewed age and gender appropriate health maintenance issues (prudent diet, regular exercise, health risks of tobacco and excessive alcohol, use of seatbelts, fire alarms in home, use of sunscreen).  Also reviewed age and gender appropriate health screening as well as vaccine recommendations. Vaccines: All UTD.  Covid 19-->has not had vaccine and doesn't want it. Labs: none today.  Will get most recent labs from nephrologist visit this week. Prostate ca screening: no further screening due to age. Colon ca screening: no further screening due to age"  INTERIM HX: Doing well.  I noted his wt down 9 lbs in last 6 mo, he says "I've never been much of an eater". Appetite is good, BMs normal.  No abd pain or early satiety.  About 2 wks ago acute onset R MTP jt pain and swelling. Gradual spont resolution has occurred--says almost back to normal now. No other joints bothering him.  Still active and w/out any dizziness/orthostasis/presyncope.  No SOB/DOE, no CP. No palpitations.  He avoids NSAIDs.  Drinks plenty of  water. Has f/u with Dr. Clover Mealy in 4 days.  ROS: no fevers, no wheezing, no cough, no HAs, no rashes, no melena/hematochezia.  No polyuria or polydipsia.  No myalgias. No focal weakness, paresthesias, or tremors.  No acute vision or hearing abnormalities. No n/v/d or abd pain.  No palpitations.     Past Medical History:  Diagnosis Date  . Anemia of chronic renal failure   . Atrial flutter (HCC)    persistent, on anticoagulation  . BPH (benign prostatic hyperplasia)   . Chronic leukopenia    Neutropenia, monocytosis also with chronic thrombocytopenia--Dr. Marin Olp following; stable as of 03/16/16 f/u.  Marland Kitchen Chronic renal insufficiency, stage 4 (severe) (HCC)    Nephrosclerosis.  +Proteinuria.  GFR stable at 26 ml/min (sCr 2.21) as of 12/2017 neph f/u.  ?nephrotic syndrome in 7s?--no old records. Dr. Jimmy Footman, 2018, renal u/s- medical renal dz/Nephrosclerosis per renal (no RAS).  Cr 2.47/GFR 23 on 02/13/17 neph f/u.  K 5.5.  sCr stable at 2.04, K 4.25 Apr 2018 neph f/u.  Marland Kitchen Colon polyps    Colonoscopy x 3 per pt--initial one with polyp but two 5 yr f/u colonoscopies normal per pt.  . Depression   . Gout    Doing ok since off allopurinol 2015  . H/O exercise stress test    about 5 yrs. ago, saw cardiologist, had normal stress test, told NO need  to f/u /w cardiologist   . History of hyperkalemia 07/2016   cutting ACE-I dose in half helped this normalize.  +  Recurrence 09/2016 (K 5.7 on neph f/u labs).  Elevated again 01/2017--started pt on scheduled kayexalate.  . Hyperkalemia    Due to CKD stage 4 (improved when ACE-I d/c'd).  . Hyperlipidemia   . Hypertension    takes meds daily  . Leukopenia   . Microhematuria    Renal u/s 08/2013 remarkable only for small nonobstructing stone in each kidney.  Urol feels like this is likely the source of the microhem, cystoscopy likely low yield--following stones with q33moultrasounds and office f/u ([facility])  . Severe aortic stenosis    cardiologist  recommended TAVR 09/2015 and again 01/2016--pt declines this procedure b/c he is asymptomatic. Rpt echo 01/2018 stable AV area and flow gradients (all in severe range). Next echo 01/2019 per cards.  . Subclinical hypothyroidism 06/2014   TSH 5.2, normal T4 and T3.  . Thrombocytopenia ([facility]   . UTI (urinary tract infection) 02/2016   MSSA    Past Surgical History:  Procedure Laterality Date  . COLONOSCOPY    . EYE SURGERY     cataract - right  . INGUINAL HERNIA REPAIR  12/27/2011   Procedure: HERNIA REPAIR INGUINAL ADULT;  Surgeon: TOdis Hollingshead MD;  Location: [facility]  Service: General;  Laterality: Right;  . PROSTATE SURGERY     TURP 1980s/90s.--HELPED.  . TONSILLECTOMY    . TRANSTHORACIC ECHOCARDIOGRAM  07/22/14; 09/2015; 02/09/18; 01/2019   EF 55-60%, AS worse than prior echo: mean gradient 44, peak >100.  2017 f/u echo showed progression of AS to severe-to-critical. 01/2018 stable-->LVEF 60-65%, mild LVH, normal wall motion, severe AS, peak gradient 100, mean 63, AV area0.85cm. 01/2019 no change.    Outpatient Medications Prior to Visit  Medication Sig Dispense Refill  . allopurinol (ZYLOPRIM) 100 MG tablet Take 100 mg by mouth daily.    .Marland Kitchenamitriptyline (ELAVIL) 50 MG tablet TAKE 1 TABLET BY MOUTH EVERYDAY AT BEDTIME 90 tablet 0  . amLODipine (NORVASC) 5 MG tablet Take 1 tablet by mouth daily.    . clobetasol cream (TEMOVATE) 0AB-123456789% Apply 1 application topically 2 (two) times daily. 30 g 2  . ELIQUIS 2.5 MG TABS tablet TAKE 1 TABLET BY MOUTH TWICE A DAY 180 tablet 0  . ezetimibe (ZETIA) 10 MG tablet TAKE 1 TABLET BY MOUTH EVERY DAY 90 tablet 0  . metoprolol succinate (TOPROL-XL) 50 MG 24 hr tablet TAKE 1 TABLET BY MOUTH DAILY. TAKE WITH OR IMMEDIATELY FOLLOWING A MEAL. 90 tablet 0  . tamsulosin (FLOMAX) 0.4 MG CAPS capsule TAKE 1 CAPSULE BY MOUTH EVERY DAY 90 capsule 0   No facility-administered medications prior to visit.    No Known Allergies  ROS As per HPI  PE: Vitals  with BMI 06/05/2020 12/13/2019 05/24/2019  Height '5\' 11"'$  '5\' 11"'$  5' 11.5"  Weight 168 lbs 3 oz 177 lbs 10 oz 176 lbs  BMI 23.47 [id number] Systolic [id number] Diastolic 69 70 79  Pulse 84 90 88   Gen: Alert, well appearing.  Patient is oriented to person, place, time, and situation. AFFECT: pleasant, lucid thought and speech. EVH:[id number] no injection, icteris, swelling, or exudate.  EOMI, PERRLA. Mouth: lips without lesion/swelling.  Oral mucosa pink and moist. Oropharynx without erythema, exudate, or swelling.  CV: Irreg irreg, rate about 80, harsh systolic murmur, indistinct S1 and S2, no diastolic murmur, no r/g. LUNGS: CTA bilat, nonlabored resps, good aeration in all lung fields. EXT: no clubbing or cyanosis.  no edema.    LABS:  Lab Results  Component Value Date   TSH 3.82 07/19/2016   Lab Results  Component Value Date   WBC 2.0 Repeated and verified X2. (L) 05/24/2019   HGB 15.4 12/10/2019   HCT 44.5 05/24/2019   MCV 89.5 05/24/2019   PLT 142.0 (L) 05/24/2019   Lab Results  Component Value Date   CREATININE 2.2 (A) 12/10/2019   BUN 29 (A) 12/10/2019   NA 136 (A) 12/10/2019   K 5.7 (A) 12/10/2019   CL 107 12/10/2019   CO2 23 (A) 12/10/2019   Lab Results  Component Value Date   ALT 8 05/24/2019   AST 13 05/24/2019   ALKPHOS 87 05/24/2019   BILITOT 1.2 05/24/2019   Lab Results  Component Value Date   CHOL 118 11/22/2018   Lab Results  Component Value Date   HDL 30.00 (L) 11/22/2018   Lab Results  Component Value Date   LDLCALC 71 11/22/2018   Lab Results  Component Value Date   TRIG 88.0 11/22/2018   Lab Results  Component Value Date   CHOLHDL 4 11/22/2018   IMPRESSION AND PLAN:  1) HTN: stable. Cont amlod 5 qd and toprol xl 50 qd. Lytes/cr today.  2) HLD: tolerating zetia. FLP and hepatic panel today.  3) CRI IV: avoiding NSAIDs and focusing on hydrating well. Lytes/cr today. Routine nephrol f/u in 4d.  4) A-fib, chronic anticoag.  Asymptomatic.  Cont metop and eliquis. No signs of bleeding. CBC today.  5) Severe aortic stenosis: asymptomatic. -per cardiology.  6) Gout: recent R MTP flare, resolved at this time. Pt says this is first flare in years. Cont allopurinol 100 mg qd. Call/make return appt at onset of next flare and we can get prednisone started.  7) Wt loss: down 9 lbs in last 6 mo. No s/s of acute or chronic illness to explain this. Looking back at wt's over the last few years his wt has hovered around 170 lbs---so his current wt is certainly not alarming.  Obs. An After Visit Summary was printed and given to the patient.  FOLLOW UP: Return in about 6 months (around 12/03/2020) for annual CPE (fasting) + RCI.  Signed:  Crissie Sickles, MD           06/05/2020

## 2020-06-05 NOTE — Telephone Encounter (Signed)
CRITICAL VALUE STICKER  CRITICAL VALUE: WBC 1.5  RECEIVER (on-site recipient of call): [place] NOTIFIED: [id number]  MESSENGER (representative from lab): [place]  MD NOTIFIED: [place]  TIME OF NOTIFICATION:  RESPONSE:

## 2020-06-05 NOTE — Telephone Encounter (Signed)
Noted. Chronic/known.

## 2020-06-09 LAB — COMPREHENSIVE METABOLIC PANEL
Albumin: 3.7 (ref 3.5–5.0)
Calcium: 9.7 (ref 8.7–10.7)
GFR calc Af Amer: 35
GFR calc non Af Amer: 30

## 2020-06-09 LAB — BASIC METABOLIC PANEL
BUN: 25 — AB (ref 4–21)
CO2: 23 — AB (ref 13–22)
Chloride: 103 (ref 99–108)
Creatinine: 2 — AB (ref 0.6–1.3)
Glucose: 96
Potassium: 5.1 (ref 3.4–5.3)
Sodium: 134 — AB (ref 137–147)

## 2020-06-10 ENCOUNTER — Other Ambulatory Visit: Payer: Self-pay

## 2020-06-10 ENCOUNTER — Encounter: Payer: Self-pay | Admitting: Family Medicine

## 2020-06-10 ENCOUNTER — Ambulatory Visit (INDEPENDENT_AMBULATORY_CARE_PROVIDER_SITE_OTHER): Payer: Medicare Other | Admitting: Family Medicine

## 2020-06-10 ENCOUNTER — Telehealth: Payer: Self-pay

## 2020-06-10 VITALS — BP 154/80 | HR 100 | Temp 98.1°F | Resp 16 | Ht 71.0 in | Wt 165.2 lb

## 2020-06-10 DIAGNOSIS — M109 Gout, unspecified: Secondary | ICD-10-CM

## 2020-06-10 DIAGNOSIS — M79671 Pain in right foot: Secondary | ICD-10-CM

## 2020-06-10 MED ORDER — PREDNISONE 20 MG PO TABS: ORAL_TABLET | ORAL | 0 refills | Status: DC

## 2020-06-10 NOTE — Progress Notes (Signed)
OFFICE VISIT  06/10/2020  CC:  Chief Complaint  Patient presents with  . Severe foot pain    Right, starting 2 weeks ago. Thought it was gout but has been taking the allopurinol qd as prescribed.    HPI:    Patient is a 87 y.o. Caucasian male who presents for pain in right foot. About 1 wk hx of pain and swelling and redness in R foot, focused in midfoot. No meds taken, can't take nsaids b/c on eliquis and has severe CRI. No fevers, chills, malaise, or fatigue.  No other joints bothering him.  No recent change in diet.  Says it has been "a while" since last gout flare, says flares always manifest same as this one.   Past Medical History:  Diagnosis Date  . Anemia of chronic renal failure   . Atrial flutter (HCC)    persistent, on anticoagulation  . BPH (benign prostatic hyperplasia)   . Chronic leukopenia    Neutropenia, monocytosis also with chronic thrombocytopenia--Dr. Marin Olp following; stable as of 03/16/16 f/u.  Marland Kitchen Chronic renal insufficiency, stage 4 (severe) (HCC)    Nephrosclerosis.  +Proteinuria.  GFR stable at 26 ml/min (sCr 2.21) as of 12/2017 neph f/u.  ?nephrotic syndrome in 43s?--no old records. Dr. Jimmy Footman, 2018, renal u/s- medical renal dz/Nephrosclerosis per renal (no RAS).  Cr 2.47/GFR 23 on 02/13/17 neph f/u.  K 5.5.  sCr stable at 2.04, K 4.25 Apr 2018 neph f/u.  Marland Kitchen Colon polyps    Colonoscopy x 3 per pt--initial one with polyp but two 5 yr f/u colonoscopies normal per pt.  . Depression   . Gout    Doing ok since off allopurinol 2015  . H/O exercise stress test    about 5 yrs. ago, saw cardiologist, had normal stress test, told NO need  to f/u /w cardiologist   . History of hyperkalemia 07/2016   cutting ACE-I dose in half helped this normalize.  +Recurrence 09/2016 (K 5.7 on neph f/u labs).  Elevated again 01/2017--started pt on scheduled kayexalate.  . Hyperkalemia    Due to CKD stage 4 (improved when ACE-I d/c'd).  . Hyperlipidemia   . Hypertension     takes meds daily  . Leukopenia   . Microhematuria    Renal u/s 08/2013 remarkable only for small nonobstructing stone in each kidney.  Urol feels like this is likely the source of the microhem, cystoscopy likely low yield--following stones with q38moultrasounds and office f/u ([facility])  . Severe aortic stenosis    cardiologist recommended TAVR 09/2015 and again 01/2016--pt declines this procedure b/c he is asymptomatic. Rpt echo 01/2018 stable AV area and flow gradients (all in severe range). Next echo 01/2019 per cards.  . Subclinical hypothyroidism 06/2014   TSH 5.2, normal T4 and T3.  . [place]   . UTI (urinary tract infection) 02/2016   MSSA    Past Surgical History:  Procedure Laterality Date  . COLONOSCOPY    . EYE SURGERY     cataract - right  . INGUINAL HERNIA REPAIR  12/27/2011   Procedure: HERNIA REPAIR INGUINAL ADULT;  Surgeon: TOdis Hollingshead MD;  Location: [facility]  Service: General;  Laterality: Right;  . PROSTATE SURGERY     TURP 1980s/90s.--HELPED.  . TONSILLECTOMY    . TRANSTHORACIC ECHOCARDIOGRAM  07/22/14; 09/2015; 02/09/18; 01/2019   EF 55-60%, AS worse than prior echo: mean gradient 44, peak >100.  2017 f/u echo showed progression of AS to severe-to-critical. 01/2018 stable-->LVEF 60-65%,  mild LVH, normal wall motion, severe AS, peak gradient 100, mean 63, AV area0.85cm. 01/2019 no change.    Outpatient Medications Prior to Visit  Medication Sig Dispense Refill  . allopurinol (ZYLOPRIM) 100 MG tablet Take 100 mg by mouth daily.    Marland Kitchen amitriptyline (ELAVIL) 50 MG tablet TAKE 1 TABLET BY MOUTH EVERYDAY AT BEDTIME 90 tablet 0  . amLODipine (NORVASC) 5 MG tablet Take 1 tablet by mouth daily.    . clobetasol cream (TEMOVATE) 8.18 % Apply 1 application topically 2 (two) times daily. 30 g 2  . ELIQUIS 2.5 MG TABS tablet TAKE 1 TABLET BY MOUTH TWICE A DAY 180 tablet 0  . ezetimibe (ZETIA) 10 MG tablet TAKE 1 TABLET BY MOUTH EVERY DAY 90 tablet 0  . metoprolol  succinate (TOPROL-XL) 50 MG 24 hr tablet TAKE 1 TABLET BY MOUTH DAILY. TAKE WITH OR IMMEDIATELY FOLLOWING A MEAL. 90 tablet 0  . tamsulosin (FLOMAX) 0.4 MG CAPS capsule TAKE 1 CAPSULE BY MOUTH EVERY DAY 90 capsule 0   No facility-administered medications prior to visit.    No Known Allergies  ROS As per HPI  PE: Vitals with BMI 06/10/2020 06/05/2020 12/13/2019  Height 5' 11"  5' 11"  5' 11"   Weight 165 lbs 3 oz 168 lbs 3 oz 177 lbs 10 oz  BMI 23.05 29.93 71.69  Systolic 678 938 101  Diastolic 80 69 70  Pulse 751 84 90     Gen: Alert, well appearing.  Patient is oriented to person, place, time, and situation. AFFECT: pleasant, lucid thought and speech. R ankle and dorsum of R foot with trace pitting edema.  Mild blanching erythema on dorsum of midfoot on R TTP diffusely from midfoot region distally to MTPs----mainly digits 2-5.  Moves all toes fine.  Sensation intact.   No calf tenderness or swelling. No pretibial edema.    L leg, ankle, and foot w/out abnormality.  LABS:    Chemistry      Component Value Date/Time   NA 135 06/05/2020 0828   NA 136 (A) 12/10/2019 0000   NA 141 03/17/2016 1044   NA 141 03/16/2016 0955   K 5.3 No hemolysis seen (H) 06/05/2020 0828   K 5.2 No Visible Hemolysis (H) 03/17/2016 1044   K 5.8 No visable hemolysis, repeated to verify (H) 03/16/2016 0955   CL 103 06/05/2020 0828   CL 112 (H) 03/17/2016 1044   CO2 26 06/05/2020 0828   CO2 21 03/17/2016 1044   CO2 20 (L) 03/16/2016 0955   BUN 28 (H) 06/05/2020 0828   BUN 29 (A) 12/10/2019 0000   BUN 44 (H) 03/17/2016 1044   BUN 45.8 (H) 03/16/2016 0955   CREATININE 2.61 (H) 06/05/2020 0828   CREATININE 2.4 (H) 03/17/2016 1044   CREATININE 2.6 (H) 03/16/2016 0955   GLU 105 12/10/2019 0000      Component Value Date/Time   CALCIUM 9.5 06/05/2020 0828   CALCIUM 9.7 03/17/2016 1044   CALCIUM 10.4 03/16/2016 0955   ALKPHOS 89 06/05/2020 0828   ALKPHOS 89 03/16/2016 0955   AST 11 06/05/2020 0828    AST 20 03/16/2016 0955   ALT 6 06/05/2020 0828   ALT 17 03/16/2016 0955   BILITOT 1.3 (H) 06/05/2020 0828   BILITOT 0.93 03/16/2016 0955     Lab Results  Component Value Date   WBC 1.5 Repeated and verified X2. (LL) 06/05/2020   HGB 14.1 06/05/2020   HCT 42.2 06/05/2020   MCV 85.6 06/05/2020  PLT 186.0 06/05/2020   Lab Results  Component Value Date   LABURIC 7.4 06/20/2014   IMPRESSION AND PLAN:  Acute gout, R midfoot. Prednisone 82m qd x 5d. Tylenol 1000 mg q6h prn. CHeck uric acid level, esr, and crp today.  This is his first flare in "years" fortunately.  Cont allopurinol 1056mqd.  An After Visit Summary was printed and given to the patient.  FOLLOW UP: No follow-ups on file.  Signed:  PhCrissie SicklesMD           06/10/2020

## 2020-06-10 NOTE — Telephone Encounter (Signed)
Noted  

## 2020-06-10 NOTE — Telephone Encounter (Signed)
Foot pain and possible gout.  Patient requested appt to see Dr. Anitra Lauth.  I have held 4:00PM time slot today for patient.   Please let me know if this can be approved for in office visit.

## 2020-06-10 NOTE — Telephone Encounter (Signed)
Patient called back to check status.  He confirmed appt today at [facility]

## 2020-06-10 NOTE — Telephone Encounter (Signed)
Yes, in person at [place] today is good

## 2020-06-10 NOTE — Telephone Encounter (Signed)
Please advise, thanks.

## 2020-06-11 ENCOUNTER — Telehealth: Payer: Self-pay | Admitting: *Deleted

## 2020-06-11 ENCOUNTER — Telehealth: Payer: Self-pay | Admitting: Family Medicine

## 2020-06-11 LAB — SEDIMENTATION RATE: Sed Rate: 15 mm/hr (ref 0–20)

## 2020-06-11 LAB — URIC ACID: Uric Acid, Serum: 5.5 mg/dL (ref 4.0–7.8)

## 2020-06-11 LAB — C-REACTIVE PROTEIN: CRP: 2.7 mg/dL (ref 0.5–20.0)

## 2020-06-11 NOTE — Telephone Encounter (Signed)
After patient's visit yesterday, he left his butter knife that he was using for a shoehorn. I spoke with patient this morning and he asked me to just throw it away because he has plenty.

## 2020-06-11 NOTE — Telephone Encounter (Signed)
Per referral 06/10/20 Dr. Moshe Cipro - unable to lvm - mailed welcome packet with calendar

## 2020-06-15 ENCOUNTER — Telehealth: Payer: Self-pay | Admitting: Family Medicine

## 2020-06-15 NOTE — Telephone Encounter (Signed)
A user error has taken place: encounter opened in error, closed for administrative reasons.

## 2020-06-15 NOTE — Telephone Encounter (Signed)
I called pt and checked on how his foot is feeling and he stated it is "90% better" and he's very happy with how things are feeling. Signed:  Crissie Sickles, MD           06/15/2020

## 2020-06-18 ENCOUNTER — Other Ambulatory Visit: Payer: Self-pay

## 2020-06-19 ENCOUNTER — Encounter: Payer: Self-pay | Admitting: Family Medicine

## 2020-06-19 ENCOUNTER — Ambulatory Visit: Payer: Medicare Other | Admitting: Family Medicine

## 2020-06-19 VITALS — BP 141/66 | HR 54 | Temp 98.0°F | Resp 16 | Ht 71.0 in | Wt 166.8 lb

## 2020-06-19 DIAGNOSIS — M10071 Idiopathic gout, right ankle and foot: Secondary | ICD-10-CM | POA: Diagnosis not present

## 2020-06-19 NOTE — Progress Notes (Signed)
OFFICE VISIT  06/19/2020  CC:  Chief Complaint  Patient presents with  . Follow-up    R foot pain   HPI:    Patient is a 87 y.o. Caucasian male who presents for 9 day f/u R foot pain. A/P as of last visit: "Acute gout, R midfoot. Prednisone 57m qd x 5d. Tylenol 1000 mg q6h prn. CHeck uric acid level, esr, and crp today.  This is his first flare in "years" fortunately.  Cont allopurinol 1076mqd."  INTERIM HX: Says 90% better!  Says started getting better very soon after starting the prednisone.  Basically no more pain, no swelling, no more limping. He continues to take allopurinol 10070md as he has done long term.  Past Medical History:  Diagnosis Date  . Anemia of chronic renal failure   . Atrial flutter (HCC)    persistent, on anticoagulation  . BPH (benign prostatic hyperplasia)   . Chronic leukopenia    Neutropenia, monocytosis also with chronic thrombocytopenia--Dr. EnnMarin Olpllowing; stable as of 03/16/16 f/u.  . CMarland Kitchenronic renal insufficiency, stage 4 (severe) (HCC)    Nephrosclerosis.  +Proteinuria.  GFR stable at 26 ml/min (sCr 2.21) as of 12/2017 neph f/u.  ?nephrotic syndrome in 19977s?--nod records. Dr. DetJimmy Footman018, renal u/s- medical renal dz/Nephrosclerosis per renal (no RAS).  Cr 2.47/GFR 23 on 02/13/17 neph f/u.  K 5.5.  sCr stable at 2.04, K 4.25 Apr 2018 neph f/u.  . CMarland Kitchenlon polyps    Colonoscopy x 3 per pt--initial one with polyp but two 5 yr f/u colonoscopies normal per pt.  . Depression   . Gout    R foot/MTP  . H/O exercise stress test    "normal"  . History of hyperkalemia 07/2016   cutting ACE-I dose in half helped this normalize.  +Recurrence 09/2016 (K 5.7 on neph f/u labs).  Elevated again 01/2017--started pt on scheduled kayexalate.  . Hyperkalemia    Due to CKD stage 4 (improved when ACE-I d/c'd).  . Hyperlipidemia   . Hypertension    takes meds daily  . Leukopenia   . Microhematuria    Renal u/s 08/2013 remarkable only for small  nonobstructing stone in each kidney.  Urol feels like this is likely the source of the microhem, cystoscopy likely low yield--following stones with q6mo60morasounds and office f/u ([facility])  . Severe aortic stenosis    cardiologist recommended TAVR 09/2015 and again 01/2016--pt declines this procedure b/c he is asymptomatic. Rpt echo 01/2018 stable AV area and flow gradients (all in severe range). Next echo 01/2019 per cards.  . Subclinical hypothyroidism 06/2014   TSH 5.2, normal T4 and T3.  . Thrombocytopenia (HCC)[place]. UTI (urinary tract infection) 02/2016   MSSA    Past Surgical History:  Procedure Laterality Date  . COLONOSCOPY    . EYE SURGERY     cataract - right  . INGUINAL HERNIA REPAIR  12/27/2011   Procedure: HERNIA REPAIR INGUINAL ADULT;  Surgeon: ToddOdis Hollingshead;  [facility];  Laterality: Right;  . PROSTATE SURGERY     TURP 1980s/90s.--HELPED.  . TONSILLECTOMY    . TRANSTHORACIC ECHOCARDIOGRAM  07/22/14; 09/2015; 02/09/18; 01/2019   EF 55-60%, AS worse than prior echo: mean gradient 44, peak >100.  2017 f/u echo showed progression of AS to severe-to-critical. 01/2018 stable-->LVEF 60-65%, mild LVH, normal wall motion, severe AS, peak gradient 100, mean 63, AV area0.85cm. 01/2019 no change.    Outpatient Medications Prior to  Visit  Medication Sig Dispense Refill  . allopurinol (ZYLOPRIM) 100 MG tablet Take 100 mg by mouth daily.    Marland Kitchen amitriptyline (ELAVIL) 50 MG tablet TAKE 1 TABLET BY MOUTH EVERYDAY AT BEDTIME 90 tablet 0  . amLODipine (NORVASC) 5 MG tablet Take 1 tablet by mouth daily.    . clobetasol cream (TEMOVATE) 0.10 % Apply 1 application topically 2 (two) times daily. 30 g 2  . ELIQUIS 2.5 MG TABS tablet TAKE 1 TABLET BY MOUTH TWICE A DAY 180 tablet 0  . ezetimibe (ZETIA) 10 MG tablet TAKE 1 TABLET BY MOUTH EVERY DAY 90 tablet 0  . metoprolol succinate (TOPROL-XL) 50 MG 24 hr tablet TAKE 1 TABLET BY MOUTH DAILY. TAKE WITH OR IMMEDIATELY  FOLLOWING A MEAL. 90 tablet 0  . tamsulosin (FLOMAX) 0.4 MG CAPS capsule TAKE 1 CAPSULE BY MOUTH EVERY DAY 90 capsule 0  . predniSONE (DELTASONE) 20 MG tablet 2 tabs po qd x 5d (Patient not taking: Reported on 06/19/2020) 10 tablet 0   No facility-administered medications prior to visit.    No Known Allergies  ROS As per HPI  PE: Vitals with BMI 06/19/2020 06/10/2020 06/05/2020  Height 5' 11"  5' 11"  5' 11"   Weight 166 lbs 13 oz 165 lbs 3 oz 168 lbs 3 oz  BMI 23.27 93.23 55.73  Systolic 220 254 270  Diastolic 66 80 69  Pulse 54 100 84     Gen: Alert, well appearing.  Patient is oriented to person, place, time, and situation. AFFECT: pleasant, lucid thought and speech. R ankle and foot w/out swelling, excessive warmth, or tenderness to palpation. Mild diffuse erythematous/pinkish hue to the top of R foot.  DP and PT pulses palpable bilat.  Good/normal ROM of both ankles and both feet/all toes.   Sensation intact.  LABS:    Chemistry      Component Value Date/Time   NA 134 (A) 06/09/2020 0000   NA 141 03/17/2016 1044   NA 141 03/16/2016 0955   K 5.1 06/09/2020 0000   K 5.2 No Visible Hemolysis (H) 03/17/2016 1044   K 5.8 No visable hemolysis, repeated to verify (H) 03/16/2016 0955   CL 103 06/09/2020 0000   CL 112 (H) 03/17/2016 1044   CO2 23 (A) 06/09/2020 0000   CO2 21 03/17/2016 1044   CO2 20 (L) 03/16/2016 0955   BUN 25 (A) 06/09/2020 0000   BUN 44 (H) 03/17/2016 1044   BUN 45.8 (H) 03/16/2016 0955   CREATININE 2.0 (A) 06/09/2020 0000   CREATININE 2.61 (H) 06/05/2020 0828   CREATININE 2.4 (H) 03/17/2016 1044   CREATININE 2.6 (H) 03/16/2016 0955   GLU 96 06/09/2020 0000      Component Value Date/Time   CALCIUM 9.7 06/09/2020 0000   CALCIUM 9.7 03/17/2016 1044   CALCIUM 10.4 03/16/2016 0955   ALKPHOS 89 06/05/2020 0828   ALKPHOS 89 03/16/2016 0955   AST 11 06/05/2020 0828   AST 20 03/16/2016 0955   ALT 6 06/05/2020 0828   ALT 17 03/16/2016 0955   BILITOT 1.3  (H) 06/05/2020 0828   BILITOT 0.93 03/16/2016 0955     Lab Results  Component Value Date   WBC 1.5 Repeated and verified X2. (LL) 06/05/2020   HGB 14.1 06/05/2020   HCT 42.2 06/05/2020   MCV 85.6 06/05/2020   PLT 186.0 06/05/2020   Lab Results  Component Value Date   LABURIC 5.5 06/10/2020   Lab Results  Component Value Date   ESRSEDRATE  15 06/10/2020   Lab Results  Component Value Date   CRP 2.7 06/10/2020    IMPRESSION AND PLAN:  1) R foot gout, resolved. Still with some residual postinflamm pigmentation changes in skin/subQ tissues of dorsum of R foot. Expect this to gradually resolve. Cont allopurinol 110m qd. Reviewed labs from 06/10/20 with pt today: uric acid, crp, and esr normal.  2) Chronic leukopenia, hx of thrombocytopenia. Has been followed by Dr. EMarin Olpin the past but apparently no f/u since 03/16/16 (plan as of that visit was continue observation and f/u 4 mo). His nephrologist took note of this and arranged to have him f/u with Dr. EMarin Olpagain and this is set for 07/07/20.  An After Visit Summary was printed and given to the patient.  FOLLOW UP: No follow-ups on file.  Signed:  PCrissie Sickles MD           06/19/2020

## 2020-06-23 ENCOUNTER — Telehealth: Payer: Self-pay | Admitting: *Deleted

## 2020-06-23 NOTE — Telephone Encounter (Signed)
Patient called to cancel appointment.

## 2020-06-30 ENCOUNTER — Other Ambulatory Visit: Payer: Self-pay | Admitting: Family Medicine

## 2020-07-07 ENCOUNTER — Other Ambulatory Visit: Payer: Medicare Other

## 2020-07-07 ENCOUNTER — Ambulatory Visit: Payer: Medicare Other | Admitting: Family

## 2020-07-18 ENCOUNTER — Emergency Department (HOSPITAL_COMMUNITY): Payer: Medicare Other

## 2020-07-18 ENCOUNTER — Inpatient Hospital Stay (HOSPITAL_COMMUNITY): Payer: Medicare Other

## 2020-07-18 ENCOUNTER — Encounter (HOSPITAL_COMMUNITY): Payer: Self-pay | Admitting: Internal Medicine

## 2020-07-18 ENCOUNTER — Inpatient Hospital Stay (HOSPITAL_COMMUNITY)
Admission: EM | Admit: 2020-07-18 | Discharge: 2020-08-16 | DRG: 871 | Disposition: E | Payer: Medicare Other | Attending: Internal Medicine | Admitting: Internal Medicine

## 2020-07-18 ENCOUNTER — Other Ambulatory Visit: Payer: Self-pay

## 2020-07-18 DIAGNOSIS — B9561 Methicillin susceptible Staphylococcus aureus infection as the cause of diseases classified elsewhere: Secondary | ICD-10-CM | POA: Diagnosis present

## 2020-07-18 DIAGNOSIS — R269 Unspecified abnormalities of gait and mobility: Secondary | ICD-10-CM | POA: Diagnosis present

## 2020-07-18 DIAGNOSIS — R54 Age-related physical debility: Secondary | ICD-10-CM | POA: Diagnosis present

## 2020-07-18 DIAGNOSIS — E872 Acidosis, unspecified: Secondary | ICD-10-CM

## 2020-07-18 DIAGNOSIS — I6203 Nontraumatic chronic subdural hemorrhage: Secondary | ICD-10-CM | POA: Diagnosis present

## 2020-07-18 DIAGNOSIS — R64 Cachexia: Secondary | ICD-10-CM | POA: Diagnosis present

## 2020-07-18 DIAGNOSIS — G9341 Metabolic encephalopathy: Secondary | ICD-10-CM | POA: Diagnosis not present

## 2020-07-18 DIAGNOSIS — A419 Sepsis, unspecified organism: Secondary | ICD-10-CM | POA: Diagnosis not present

## 2020-07-18 DIAGNOSIS — E785 Hyperlipidemia, unspecified: Secondary | ICD-10-CM | POA: Diagnosis present

## 2020-07-18 DIAGNOSIS — W19XXXA Unspecified fall, initial encounter: Secondary | ICD-10-CM | POA: Diagnosis not present

## 2020-07-18 DIAGNOSIS — D696 Thrombocytopenia, unspecified: Secondary | ICD-10-CM | POA: Diagnosis present

## 2020-07-18 DIAGNOSIS — Z515 Encounter for palliative care: Secondary | ICD-10-CM | POA: Diagnosis not present

## 2020-07-18 DIAGNOSIS — N179 Acute kidney failure, unspecified: Secondary | ICD-10-CM | POA: Diagnosis present

## 2020-07-18 DIAGNOSIS — J189 Pneumonia, unspecified organism: Secondary | ICD-10-CM | POA: Diagnosis present

## 2020-07-18 DIAGNOSIS — Z20822 Contact with and (suspected) exposure to covid-19: Secondary | ICD-10-CM | POA: Diagnosis present

## 2020-07-18 DIAGNOSIS — R4587 Impulsiveness: Secondary | ICD-10-CM | POA: Diagnosis present

## 2020-07-18 DIAGNOSIS — N184 Chronic kidney disease, stage 4 (severe): Secondary | ICD-10-CM | POA: Diagnosis present

## 2020-07-18 DIAGNOSIS — Z6823 Body mass index (BMI) 23.0-23.9, adult: Secondary | ICD-10-CM

## 2020-07-18 DIAGNOSIS — R7881 Bacteremia: Secondary | ICD-10-CM | POA: Diagnosis not present

## 2020-07-18 DIAGNOSIS — J9601 Acute respiratory failure with hypoxia: Secondary | ICD-10-CM | POA: Diagnosis present

## 2020-07-18 DIAGNOSIS — D631 Anemia in chronic kidney disease: Secondary | ICD-10-CM | POA: Diagnosis present

## 2020-07-18 DIAGNOSIS — E7800 Pure hypercholesterolemia, unspecified: Secondary | ICD-10-CM

## 2020-07-18 DIAGNOSIS — Z66 Do not resuscitate: Secondary | ICD-10-CM | POA: Diagnosis present

## 2020-07-18 DIAGNOSIS — E038 Other specified hypothyroidism: Secondary | ICD-10-CM | POA: Diagnosis present

## 2020-07-18 DIAGNOSIS — Z28310 Unvaccinated for covid-19: Secondary | ICD-10-CM

## 2020-07-18 DIAGNOSIS — T17908A Unspecified foreign body in respiratory tract, part unspecified causing other injury, initial encounter: Secondary | ICD-10-CM | POA: Diagnosis not present

## 2020-07-18 DIAGNOSIS — R410 Disorientation, unspecified: Secondary | ICD-10-CM | POA: Diagnosis not present

## 2020-07-18 DIAGNOSIS — R1310 Dysphagia, unspecified: Secondary | ICD-10-CM | POA: Diagnosis present

## 2020-07-18 DIAGNOSIS — M109 Gout, unspecified: Secondary | ICD-10-CM | POA: Diagnosis present

## 2020-07-18 DIAGNOSIS — E876 Hypokalemia: Secondary | ICD-10-CM | POA: Diagnosis not present

## 2020-07-18 DIAGNOSIS — I4892 Unspecified atrial flutter: Secondary | ICD-10-CM | POA: Diagnosis present

## 2020-07-18 DIAGNOSIS — F32A Depression, unspecified: Secondary | ICD-10-CM | POA: Diagnosis present

## 2020-07-18 DIAGNOSIS — N189 Chronic kidney disease, unspecified: Secondary | ICD-10-CM

## 2020-07-18 DIAGNOSIS — Z7901 Long term (current) use of anticoagulants: Secondary | ICD-10-CM

## 2020-07-18 DIAGNOSIS — N400 Enlarged prostate without lower urinary tract symptoms: Secondary | ICD-10-CM | POA: Diagnosis present

## 2020-07-18 DIAGNOSIS — I350 Nonrheumatic aortic (valve) stenosis: Secondary | ICD-10-CM

## 2020-07-18 DIAGNOSIS — B9562 Methicillin resistant Staphylococcus aureus infection as the cause of diseases classified elsewhere: Secondary | ICD-10-CM | POA: Diagnosis not present

## 2020-07-18 DIAGNOSIS — R6520 Severe sepsis without septic shock: Secondary | ICD-10-CM | POA: Diagnosis present

## 2020-07-18 DIAGNOSIS — R0902 Hypoxemia: Secondary | ICD-10-CM

## 2020-07-18 DIAGNOSIS — Z7189 Other specified counseling: Secondary | ICD-10-CM | POA: Diagnosis not present

## 2020-07-18 DIAGNOSIS — I2690 Septic pulmonary embolism without acute cor pulmonale: Secondary | ICD-10-CM | POA: Diagnosis present

## 2020-07-18 DIAGNOSIS — Z8739 Personal history of other diseases of the musculoskeletal system and connective tissue: Secondary | ICD-10-CM

## 2020-07-18 DIAGNOSIS — R0989 Other specified symptoms and signs involving the circulatory and respiratory systems: Secondary | ICD-10-CM

## 2020-07-18 DIAGNOSIS — Z87441 Personal history of nephrotic syndrome: Secondary | ICD-10-CM

## 2020-07-18 DIAGNOSIS — E871 Hypo-osmolality and hyponatremia: Secondary | ICD-10-CM | POA: Diagnosis present

## 2020-07-18 DIAGNOSIS — Y92009 Unspecified place in unspecified non-institutional (private) residence as the place of occurrence of the external cause: Secondary | ICD-10-CM

## 2020-07-18 DIAGNOSIS — N170 Acute kidney failure with tubular necrosis: Secondary | ICD-10-CM

## 2020-07-18 DIAGNOSIS — Z8719 Personal history of other diseases of the digestive system: Secondary | ICD-10-CM

## 2020-07-18 DIAGNOSIS — I10 Essential (primary) hypertension: Secondary | ICD-10-CM

## 2020-07-18 DIAGNOSIS — J811 Chronic pulmonary edema: Secondary | ICD-10-CM | POA: Diagnosis present

## 2020-07-18 DIAGNOSIS — E43 Unspecified severe protein-calorie malnutrition: Secondary | ICD-10-CM | POA: Insufficient documentation

## 2020-07-18 DIAGNOSIS — Z87442 Personal history of urinary calculi: Secondary | ICD-10-CM

## 2020-07-18 DIAGNOSIS — Z87891 Personal history of nicotine dependence: Secondary | ICD-10-CM

## 2020-07-18 DIAGNOSIS — B958 Unspecified staphylococcus as the cause of diseases classified elsewhere: Secondary | ICD-10-CM | POA: Diagnosis not present

## 2020-07-18 DIAGNOSIS — A4101 Sepsis due to Methicillin susceptible Staphylococcus aureus: Principal | ICD-10-CM | POA: Diagnosis present

## 2020-07-18 DIAGNOSIS — I129 Hypertensive chronic kidney disease with stage 1 through stage 4 chronic kidney disease, or unspecified chronic kidney disease: Secondary | ICD-10-CM | POA: Diagnosis present

## 2020-07-18 DIAGNOSIS — J45909 Unspecified asthma, uncomplicated: Secondary | ICD-10-CM | POA: Diagnosis present

## 2020-07-18 DIAGNOSIS — Z8249 Family history of ischemic heart disease and other diseases of the circulatory system: Secondary | ICD-10-CM

## 2020-07-18 DIAGNOSIS — Z8261 Family history of arthritis: Secondary | ICD-10-CM

## 2020-07-18 DIAGNOSIS — Z862 Personal history of diseases of the blood and blood-forming organs and certain disorders involving the immune mechanism: Secondary | ICD-10-CM

## 2020-07-18 DIAGNOSIS — Z9079 Acquired absence of other genital organ(s): Secondary | ICD-10-CM

## 2020-07-18 DIAGNOSIS — Z79899 Other long term (current) drug therapy: Secondary | ICD-10-CM

## 2020-07-18 DIAGNOSIS — Z9181 History of falling: Secondary | ICD-10-CM

## 2020-07-18 LAB — CBC WITH DIFFERENTIAL/PLATELET
Abs Immature Granulocytes: 0.11 10*3/uL — ABNORMAL HIGH (ref 0.00–0.07)
Basophils Absolute: 0 10*3/uL (ref 0.0–0.1)
Basophils Relative: 0 %
Eosinophils Absolute: 0 10*3/uL (ref 0.0–0.5)
Eosinophils Relative: 0 %
HCT: 39.6 % (ref 39.0–52.0)
Hemoglobin: 13 g/dL (ref 13.0–17.0)
Immature Granulocytes: 1 %
Lymphocytes Relative: 2 %
Lymphs Abs: 0.2 10*3/uL — ABNORMAL LOW (ref 0.7–4.0)
MCH: 28.1 pg (ref 26.0–34.0)
MCHC: 32.8 g/dL (ref 30.0–36.0)
MCV: 85.5 fL (ref 80.0–100.0)
Monocytes Absolute: 0.5 10*3/uL (ref 0.1–1.0)
Monocytes Relative: 4 %
Neutro Abs: 11.2 10*3/uL — ABNORMAL HIGH (ref 1.7–7.7)
Neutrophils Relative %: 93 %
Platelets: 177 10*3/uL (ref 150–400)
RBC: 4.63 MIL/uL (ref 4.22–5.81)
RDW: 15.5 % (ref 11.5–15.5)
WBC: 12.1 10*3/uL — ABNORMAL HIGH (ref 4.0–10.5)
nRBC: 0 % (ref 0.0–0.2)

## 2020-07-18 LAB — GLUCOSE, CAPILLARY: Glucose-Capillary: 91 mg/dL (ref 70–99)

## 2020-07-18 LAB — RESP PANEL BY RT-PCR (FLU A&B, COVID) ARPGX2
Influenza A by PCR: NEGATIVE
Influenza B by PCR: NEGATIVE
SARS Coronavirus 2 by RT PCR: NEGATIVE

## 2020-07-18 LAB — I-STAT CHEM 8, ED
BUN: 39 mg/dL — ABNORMAL HIGH (ref 8–23)
Calcium, Ion: 1.29 mmol/L (ref 1.15–1.40)
Chloride: 107 mmol/L (ref 98–111)
Creatinine, Ser: 3 mg/dL — ABNORMAL HIGH (ref 0.61–1.24)
Glucose, Bld: 117 mg/dL — ABNORMAL HIGH (ref 70–99)
HCT: 37 % — ABNORMAL LOW (ref 39.0–52.0)
Hemoglobin: 12.6 g/dL — ABNORMAL LOW (ref 13.0–17.0)
Potassium: 5 mmol/L (ref 3.5–5.1)
Sodium: 133 mmol/L — ABNORMAL LOW (ref 135–145)
TCO2: 17 mmol/L — ABNORMAL LOW (ref 22–32)

## 2020-07-18 LAB — BASIC METABOLIC PANEL
Anion gap: 11 (ref 5–15)
BUN: 43 mg/dL — ABNORMAL HIGH (ref 8–23)
CO2: 13 mmol/L — ABNORMAL LOW (ref 22–32)
Calcium: 8.8 mg/dL — ABNORMAL LOW (ref 8.9–10.3)
Chloride: 107 mmol/L (ref 98–111)
Creatinine, Ser: 3.04 mg/dL — ABNORMAL HIGH (ref 0.61–1.24)
GFR, Estimated: 19 mL/min — ABNORMAL LOW (ref >60)
Glucose, Bld: 102 mg/dL — ABNORMAL HIGH (ref 70–99)
Potassium: 4.8 mmol/L (ref 3.5–5.1)
Sodium: 131 mmol/L — ABNORMAL LOW (ref 135–145)

## 2020-07-18 LAB — I-STAT ARTERIAL BLOOD GAS, ED
Acid-base deficit: 12 mmol/L — ABNORMAL HIGH (ref 0.0–2.0)
Bicarbonate: 10.4 mmol/L — ABNORMAL LOW (ref 20.0–28.0)
Calcium, Ion: 1.23 mmol/L (ref 1.15–1.40)
HCT: 32 % — ABNORMAL LOW (ref 39.0–52.0)
Hemoglobin: 10.9 g/dL — ABNORMAL LOW (ref 13.0–17.0)
O2 Saturation: 97 %
Patient temperature: 98.6
Potassium: 4.6 mmol/L (ref 3.5–5.1)
Sodium: 131 mmol/L — ABNORMAL LOW (ref 135–145)
TCO2: 11 mmol/L — ABNORMAL LOW (ref 22–32)
pCO2 arterial: 15.9 mmHg — CL (ref 32.0–48.0)
pH, Arterial: 7.425 (ref 7.350–7.450)
pO2, Arterial: 81 mmHg — ABNORMAL LOW (ref 83.0–108.0)

## 2020-07-18 LAB — COMPREHENSIVE METABOLIC PANEL
ALT: 14 U/L (ref 0–44)
AST: 44 U/L — ABNORMAL HIGH (ref 15–41)
Albumin: 2.5 g/dL — ABNORMAL LOW (ref 3.5–5.0)
Alkaline Phosphatase: 69 U/L (ref 38–126)
Anion gap: 11 (ref 5–15)
BUN: 43 mg/dL — ABNORMAL HIGH (ref 8–23)
CO2: 14 mmol/L — ABNORMAL LOW (ref 22–32)
Calcium: 9.2 mg/dL (ref 8.9–10.3)
Chloride: 106 mmol/L (ref 98–111)
Creatinine, Ser: 3.12 mg/dL — ABNORMAL HIGH (ref 0.61–1.24)
GFR, Estimated: 19 mL/min — ABNORMAL LOW (ref >60)
Glucose, Bld: 126 mg/dL — ABNORMAL HIGH (ref 70–99)
Potassium: 4.9 mmol/L (ref 3.5–5.1)
Sodium: 131 mmol/L — ABNORMAL LOW (ref 135–145)
Total Bilirubin: 1.8 mg/dL — ABNORMAL HIGH (ref 0.3–1.2)
Total Protein: 6.1 g/dL — ABNORMAL LOW (ref 6.5–8.1)

## 2020-07-18 LAB — HIV ANTIBODY (ROUTINE TESTING W REFLEX): HIV Screen 4th Generation wRfx: NONREACTIVE

## 2020-07-18 LAB — APTT: aPTT: 41 seconds — ABNORMAL HIGH (ref 24–36)

## 2020-07-18 LAB — PROTIME-INR
INR: 1.6 — ABNORMAL HIGH (ref 0.8–1.2)
Prothrombin Time: 18.5 seconds — ABNORMAL HIGH (ref 11.4–15.2)

## 2020-07-18 LAB — BRAIN NATRIURETIC PEPTIDE: B Natriuretic Peptide: 1635.3 pg/mL — ABNORMAL HIGH (ref 0.0–100.0)

## 2020-07-18 LAB — LACTIC ACID, PLASMA: Lactic Acid, Venous: 2.6 mmol/L (ref 0.5–1.9)

## 2020-07-18 MED ORDER — EZETIMIBE 10 MG PO TABS
10.0000 mg | ORAL_TABLET | Freq: Every day | ORAL | Status: DC
  Administered 2020-07-19 – 2020-07-24 (×5): 10 mg via ORAL
  Filled 2020-07-18 (×6): qty 1

## 2020-07-18 MED ORDER — DOXYCYCLINE HYCLATE 100 MG PO TABS
100.0000 mg | ORAL_TABLET | Freq: Two times a day (BID) | ORAL | Status: DC
  Administered 2020-07-18 (×2): 100 mg via ORAL
  Filled 2020-07-18 (×3): qty 1

## 2020-07-18 MED ORDER — APIXABAN 2.5 MG PO TABS
2.5000 mg | ORAL_TABLET | Freq: Two times a day (BID) | ORAL | Status: DC
  Administered 2020-07-18 – 2020-07-19 (×3): 2.5 mg via ORAL
  Filled 2020-07-18 (×4): qty 1

## 2020-07-18 MED ORDER — METOPROLOL SUCCINATE ER 50 MG PO TB24
50.0000 mg | ORAL_TABLET | Freq: Every day | ORAL | Status: DC
  Administered 2020-07-19 – 2020-07-24 (×5): 50 mg via ORAL
  Filled 2020-07-18 (×6): qty 1

## 2020-07-18 MED ORDER — CEFTRIAXONE SODIUM 1 G IJ SOLR
1.0000 g | Freq: Once | INTRAMUSCULAR | Status: AC
  Administered 2020-07-18: 1 g via INTRAVENOUS
  Filled 2020-07-18: qty 10

## 2020-07-18 MED ORDER — ONDANSETRON HCL 4 MG PO TABS: 4.0000 mg | ORAL_TABLET | Freq: Four times a day (QID) | ORAL | Status: DC | PRN

## 2020-07-18 MED ORDER — AMLODIPINE BESYLATE 5 MG PO TABS
5.0000 mg | ORAL_TABLET | Freq: Every day | ORAL | Status: DC
  Administered 2020-07-19: 5 mg via ORAL
  Filled 2020-07-18: qty 1

## 2020-07-18 MED ORDER — AMITRIPTYLINE HCL 50 MG PO TABS
50.0000 mg | ORAL_TABLET | Freq: Every day | ORAL | Status: DC
  Administered 2020-07-18 – 2020-07-23 (×5): 50 mg via ORAL
  Filled 2020-07-18 (×5): qty 1

## 2020-07-18 MED ORDER — IPRATROPIUM-ALBUTEROL 0.5-2.5 (3) MG/3ML IN SOLN: 3.0000 mL | Freq: Four times a day (QID) | RESPIRATORY_TRACT | Status: DC | PRN

## 2020-07-18 MED ORDER — SODIUM CHLORIDE 0.9 % IV BOLUS
500.0000 mL | Freq: Once | INTRAVENOUS | Status: AC
  Administered 2020-07-18: 500 mL via INTRAVENOUS

## 2020-07-18 MED ORDER — ACETAMINOPHEN 500 MG PO TABS
1000.0000 mg | ORAL_TABLET | Freq: Once | ORAL | Status: AC
  Administered 2020-07-18: 1000 mg via ORAL
  Filled 2020-07-18: qty 2

## 2020-07-18 MED ORDER — POLYETHYLENE GLYCOL 3350 17 G PO PACK: 17.0000 g | PACK | Freq: Every day | ORAL | Status: DC | PRN

## 2020-07-18 MED ORDER — SODIUM CHLORIDE 0.9 % IV SOLN
2.0000 g | INTRAVENOUS | Status: DC
  Filled 2020-07-18: qty 20

## 2020-07-18 MED ORDER — TAMSULOSIN HCL 0.4 MG PO CAPS
0.4000 mg | ORAL_CAPSULE | Freq: Every day | ORAL | Status: DC
  Administered 2020-07-19 – 2020-07-24 (×5): 0.4 mg via ORAL
  Filled 2020-07-18 (×6): qty 1

## 2020-07-18 MED ORDER — GUAIFENESIN-DM 100-10 MG/5ML PO SYRP: 5.0000 mL | ORAL_SOLUTION | ORAL | Status: DC | PRN

## 2020-07-18 MED ORDER — ONDANSETRON HCL 4 MG/2ML IJ SOLN: 4.0000 mg | Freq: Four times a day (QID) | INTRAMUSCULAR | Status: DC | PRN

## 2020-07-18 MED ORDER — ENSURE ENLIVE PO LIQD
237.0000 mL | Freq: Two times a day (BID) | ORAL | Status: DC
  Administered 2020-07-19 – 2020-07-23 (×4): 237 mL via ORAL

## 2020-07-18 MED ORDER — SODIUM BICARBONATE 8.4 % IV SOLN
50.0000 meq | Freq: Once | INTRAVENOUS | Status: AC
  Administered 2020-07-18: 50 meq via INTRAVENOUS
  Filled 2020-07-18: qty 50

## 2020-07-18 MED ORDER — SODIUM CHLORIDE 0.9 % IV BOLUS
1000.0000 mL | Freq: Once | INTRAVENOUS | Status: AC
  Administered 2020-07-18: 1000 mL via INTRAVENOUS

## 2020-07-18 MED ORDER — SODIUM CHLORIDE 0.9 % IV SOLN
500.0000 mg | Freq: Once | INTRAVENOUS | Status: AC
  Administered 2020-07-18: 500 mg via INTRAVENOUS
  Filled 2020-07-18: qty 500

## 2020-07-18 MED ORDER — SODIUM CHLORIDE 0.9 % IV SOLN: INTRAVENOUS | Status: AC

## 2020-07-18 MED ORDER — ACETAMINOPHEN 650 MG RE SUPP: 650.0000 mg | Freq: Four times a day (QID) | RECTAL | Status: DC | PRN

## 2020-07-18 MED ORDER — ACETAMINOPHEN 325 MG PO TABS
650.0000 mg | ORAL_TABLET | Freq: Four times a day (QID) | ORAL | Status: DC | PRN
  Administered 2020-07-19 – 2020-07-22 (×5): 650 mg via ORAL
  Filled 2020-07-18 (×5): qty 2

## 2020-07-18 NOTE — H&P (Addendum)
History and Physical    Richard Sullivan [id number] DOB: 03-Oct-1930 DOA: 07/29/2020  PCP: Tammi Sou, MD Patient coming from: Home  Chief Complaint: Shortness of breath, cough and generalized weakness  HPI: Richard Sullivan is a 87 y.o. male with medical history significant of severe aortic stenosis conservatively managed by cardiology, a flutter on Eliquis, HTN, HLD, CKD stage IV, anemia of chronic kidney failure, BPH, chronic thrombocytopenia, hypothyroidism, gout, depression, who presented with shortness of breath, cough and generalized weakness.   Patient reports he has had progressively worsening SOB/DOE with mild nonproductive cough x 1 week.  Associate symptoms included generalized weakness, and he has had a couple falls due to weakness.  His symptoms worsened over last 24 hours.  His son called EMS yesterday and the patient received IV fluids infusion.  However, he had persistent SOB/DOE and was noted to be hypoxia with RA O2 sats of 80s percent for EMS today.  Patient was brought to the ED for further evaluation.  Denies fevers, chills, wheezing, chest pain, palpitation, nausea, vomiting, abdominal pain or diarrhea.  Denies dysuria, urinary frequency or urgency.  In the emergency room, his vitals showed Temp 101.29F, pulse 104, RR 26, BP 101/63 and room air O2 sats 88%, which improved to 97% on 2 LNC.  The labs showed sodium 131, CO2 14, BUN 43, creatinine 3.12, WBC 12.1, lactic acid 2.6, INR 1.6, negative respiratory bio fire, negative Covid.  EKG showed sinus tachycardia. CXR showed " Mild cardiomegaly, pulmonary vascular congestion, and interstitial opacities most likely due to CHF/fluid volume overload. The interstitial opacities could represent atypical pneumonia".  Patient received ceftriaxone and azithromycin in ED.   Review of Systems: As per HPI otherwise 10 point review of systems negative.  Review of Systems Otherwise negative except as per HPI,  including: General: Positive fevers, chills, and generalized weakness.  Positive for falls at home. Resp: Positive for shortness of breath, dyspnea on exertion and cough Cardiac: Denies chest pain, palpitations, orthopnea, paroxysmal nocturnal dyspnea. GI: Denies abdominal pain, nausea, vomiting, diarrhea or constipation GU: Denies dysuria, frequency, hesitancy or incontinence MS: Denies muscle aches, joint pain or swelling Neuro: Denies headache, neurologic deficits (focal weakness, numbness, tingling), abnormal gait Psych: Denies anxiety, depression, SI/HI/AVH Skin: Denies new rashes or lesions ID: Denies sick contacts, exotic exposures, travel  Past Medical History:  Diagnosis Date  . Anemia of chronic renal failure   . Atrial flutter (HCC)    persistent, on anticoagulation  . BPH (benign prostatic hyperplasia)   . Chronic leukopenia    Neutropenia, monocytosis also with chronic thrombocytopenia--Dr. Marin Olp following; stable as of 03/16/16 f/u.  Marland Kitchen Chronic renal insufficiency, stage 4 (severe) (HCC)    Nephrosclerosis.  +Proteinuria.  GFR stable at 26 ml/min (sCr 2.21) as of 12/2017 neph f/u.  ?nephrotic syndrome in 62s?--no old records. Dr. Jimmy Footman, 2018, renal u/s- medical renal dz/Nephrosclerosis per renal (no RAS).  Cr 2.47/GFR 23 on 02/13/17 neph f/u.  K 5.5.  sCr stable at 2.04, K 4.25 Apr 2018 neph f/u.  Marland Kitchen Colon polyps    Colonoscopy x 3 per pt--initial one with polyp but two 5 yr f/u colonoscopies normal per pt.  . Depression   . Gout    R foot/MTP  . H/O exercise stress test    "normal"  . History of hyperkalemia 07/2016   cutting ACE-I dose in half helped this normalize.  +Recurrence 09/2016 (K 5.7 on neph f/u labs).  Elevated again 01/2017--started pt on scheduled kayexalate.  Marland Kitchen  Hyperkalemia    Due to CKD stage 4 (improved when ACE-I d/c'd).  . Hyperlipidemia   . Hypertension    takes meds daily  . Leukopenia   . Microhematuria    Renal u/s 08/2013 remarkable only  for small nonobstructing stone in each kidney.  Urol feels like this is likely the source of the microhem, cystoscopy likely low yield--following stones with q42moultrasounds and office f/u ([facility])  . Severe aortic stenosis    cardiologist recommended TAVR 09/2015 and again 01/2016--pt declines this procedure b/c he is asymptomatic. Rpt echo 01/2018 stable AV area and flow gradients (all in severe range). Next echo 01/2019 per cards.  . Subclinical hypothyroidism 06/2014   TSH 5.2, normal T4 and T3.  . [place] [place]   . UTI (urinary tract infection) 02/2016   MSSA    Past Surgical History:  Procedure Laterality Date  . COLONOSCOPY    . EYE SURGERY     cataract - right  . INGUINAL HERNIA REPAIR  12/27/2011   Procedure: HERNIA REPAIR INGUINAL ADULT;  Surgeon: TOdis Hollingshead MD;  [facility]  Service: General;  Laterality: Right;  . PROSTATE SURGERY     TURP 1980s/90s.--HELPED.  . TONSILLECTOMY    . TRANSTHORACIC ECHOCARDIOGRAM  07/22/14; 09/2015; 02/09/18; 01/2019   EF 55-60%, AS worse than prior echo: mean gradient 44, peak >100.  2017 f/u echo showed progression of AS to severe-to-critical. 01/2018 stable-->LVEF 60-65%, mild LVH, normal wall motion, severe AS, peak gradient 100, mean 63, AV area0.85cm. 01/2019 no change.    SOCIAL HISTORY:  reports that he quit smoking about 37 years ago. His smoking use included cigarettes. He has a 32.00 pack-year smoking history. He has never used smokeless tobacco. He reports that he does not drink alcohol and does not use drugs.  No Known Allergies  FAMILY HISTORY: Family History  Problem Relation Age of Onset  . Arthritis Mother   . Hypertension Father      Prior to Admission medications   Medication Sig Start Date End Date Taking? Authorizing Provider  allopurinol (ZYLOPRIM) 100 MG tablet Take 100 mg by mouth daily. 04/18/19   [provider]  amitriptyline (ELAVIL) 50 MG tablet TAKE 1 TABLET BY MOUTH EVERYDAY AT  BEDTIME 06/30/20   McGowen, PAdrian Blackwater MD  amLODipine (NORVASC) 5 MG tablet Take 1 tablet by mouth daily. 10/26/17   [provider]  clobetasol cream (TEMOVATE) [id number]% Apply 1 application topically 2 (two) times daily. 05/24/19   McGowen, PAdrian Blackwater MD  ELIQUIS 2.5 MG TABS tablet TAKE 1 TABLET BY MOUTH TWICE A DAY 04/08/20   Dunn, DLisbeth RenshawN, PA-C  ezetimibe (ZETIA) 10 MG tablet TAKE 1 TABLET BY MOUTH EVERY DAY 06/30/20   McGowen, PAdrian Blackwater MD  metoprolol succinate (TOPROL-XL) 50 MG 24 hr tablet TAKE 1 TABLET BY MOUTH DAILY. TAKE WITH OR IMMEDIATELY FOLLOWING A MEAL. 06/30/20   McGowen, PAdrian Blackwater MD  tamsulosin (FLOMAX) 0.4 MG CAPS capsule TAKE 1 CAPSULE BY MOUTH EVERY DAY 06/30/20   MTammi Sou MD    Physical Exam: Vitals:   07/28/2020 1230 08/15/2020 1245 07/21/2020 1247 07/20/2020 1256  BP: (!) 122/58 109/79    Pulse: (!) 107 (!) 101    Resp: (!) 22 (!) 27    Temp:   98.3 F (36.8 C)   TempSrc:   Oral   SpO2: 98% 95%    Weight:    75.1 kg  Height:    '5\' 11"'$  (1.803  m)      Constitutional: NAD, calm, comfortable.  Acutely ill-appearing. Eyes: PERRL, lids and conjunctivae normal ENMT: Mucous membranes are moist. Posterior pharynx clear of any exudate or lesions.Normal dentition.  Neck: normal, supple, no masses, no thyromegaly Respiratory bilateral sounds diminished with some rales to the bases, occasional rhonchi.  No wheezing.   Normal respiratory effort. No accessory muscle use.  Cardiovascular: Tachycardia.  Regular rate and rhythm, no murmurs / rubs / gallops. No extremity edema. 2+ pedal pulses. No carotid bruits.  Abdomen: no tenderness, no masses palpated. No hepatosplenomegaly. Bowel sounds positive.  Musculoskeletal: no clubbing / cyanosis. No joint deformity upper and lower extremities. Good ROM, no contractures. Normal muscle tone.  Skin: no rashes, lesions, ulcers. No induration Neurologic: CN 2-12 grossly intact. Sensation intact, DTR normal. Strength 5/5 in all 4.   Psychiatric: Normal judgment and insight. Alert and oriented x 3. Normal mood.     Labs on Admission: I have personally reviewed following labs and imaging studies  CBC: Recent Labs  Lab 08/10/2020 0940 08/15/2020 1047 08/11/2020 1339  WBC 12.1*  --   --   NEUTROABS 11.2*  --   --   HGB 13.0 12.6* 10.9*  HCT 39.6 37.0* 32.0*  MCV 85.5  --   --   PLT 177  --   --    Basic Metabolic Panel: Recent Labs  Lab 08/11/2020 0940 07/17/2020 1047 08/09/2020 1339  Tamya Denardo 131* 133* 131*  K 4.9 5.0 4.6  CL 106 107  --   CO2 14*  --   --   GLUCOSE 126* 117*  --   BUN 43* 39*  --   CREATININE 3.12* 3.00*  --   CALCIUM 9.2  --   --    GFR: Estimated Creatinine Clearance: 18.4 mL/min (A) (by C-G formula based on SCr of 3 mg/dL (H)). Liver Function Tests: Recent Labs  Lab 07/27/2020 0940  AST 44*  ALT 14  ALKPHOS 69  BILITOT 1.8*  PROT 6.1*  ALBUMIN 2.5*   No results for input(s): LIPASE, AMYLASE in the last 168 hours. No results for input(s): AMMONIA in the last 168 hours. Coagulation Profile: Recent Labs  Lab 07/31/2020 0940  INR 1.6*   Cardiac Enzymes: No results for input(s): CKTOTAL, CKMB, CKMBINDEX, TROPONINI in the last 168 hours. BNP (last 3 results) No results for input(s): PROBNP in the last 8760 hours. HbA1C: No results for input(s): HGBA1C in the last 72 hours. CBG: No results for input(s): GLUCAP in the last 168 hours. Lipid Profile: No results for input(s): CHOL, HDL, LDLCALC, TRIG, CHOLHDL, LDLDIRECT in the last 72 hours. Thyroid Function Tests: No results for input(s): TSH, T4TOTAL, FREET4, T3FREE, THYROIDAB in the last 72 hours. Anemia Panel: No results for input(s): VITAMINB12, FOLATE, FERRITIN, TIBC, IRON, RETICCTPCT in the last 72 hours. Urine analysis:    Component Value Date/Time   COLORURINE YELLOW 12/13/2013 0851   APPEARANCEUR CLEAR 12/13/2013 0851   LABSPEC <=1.005 (A) 12/13/2013 0851   PHURINE 5.5 12/13/2013 0851   GLUCOSEU NEGATIVE 12/13/2013 0851    HGBUR SMALL (A) 12/13/2013 0851   BILIRUBINUR neg 03/03/2016 1531   KETONESUR NEGATIVE 12/13/2013 0851   PROTEINUR '100mg'$ /ml 03/03/2016 1531   UROBILINOGEN 0.2 03/03/2016 1531   UROBILINOGEN 0.2 12/13/2013 0851   NITRITE neg 03/03/2016 1531   NITRITE NEGATIVE 12/13/2013 0851   LEUKOCYTESUR large (3+) (A) 03/03/2016 1531   Sepsis Labs: !!!!!!!!!!!!!!!!!!!!!!!!!!!!!!!!!!!!!!!!!!!! '@LABRCNTIP'$ (procalcitonin:4,lacticidven:4) ) Recent Results (from the past 240 hour(s))  Resp Panel by  RT-PCR (Flu A&B, Covid) Nasopharyngeal Swab     Status: None   Collection Time: 07/27/2020 10:31 AM   Specimen: Nasopharyngeal Swab; Nasopharyngeal(NP) swabs in vial transport medium  Result Value Ref Range Status   SARS Coronavirus 2 by RT PCR NEGATIVE NEGATIVE Final    Comment: (NOTE) SARS-CoV-2 target nucleic acids are NOT DETECTED.  The SARS-CoV-2 RNA is generally detectable in upper respiratory specimens during the acute phase of infection. The lowest concentration of SARS-CoV-2 viral copies this assay can detect is 138 copies/mL. A negative result does not preclude SARS-Cov-2 infection and should not be used as the sole basis for treatment or other patient management decisions. A negative result may occur with  improper specimen collection/handling, submission of specimen other than nasopharyngeal swab, presence of viral mutation(s) within the areas targeted by this assay, and inadequate number of viral copies(<138 copies/mL). A negative result must be combined with clinical observations, patient history, and epidemiological information. The expected result is Negative.  Fact Sheet for Patients:  EntrepreneurPulse.com.au  Fact Sheet for Healthcare Providers:  IncredibleEmployment.be  This test is no t yet approved or cleared by the [place] FDA and  has been authorized for detection and/or diagnosis of SARS-CoV-2 by FDA under an Emergency Use Authorization  (EUA). This EUA will remain  in effect (meaning this test can be used) for the duration of the COVID-19 declaration under Section 564(b)(1) of the Act, 21 U.S.C.section 360bbb-3(b)(1), unless the authorization is terminated  or revoked sooner.       Influenza A by PCR NEGATIVE NEGATIVE Final   Influenza B by PCR NEGATIVE NEGATIVE Final    Comment: (NOTE) The Xpert Xpress SARS-CoV-2/FLU/RSV plus assay is intended as an aid in the diagnosis of influenza from Nasopharyngeal swab specimens and should not be used as a sole basis for treatment. Nasal washings and aspirates are unacceptable for Xpert Xpress SARS-CoV-2/FLU/RSV testing.  Fact Sheet for Patients: EntrepreneurPulse.com.au  Fact Sheet for Healthcare Providers: IncredibleEmployment.be  This test is not yet approved or cleared by the [place] FDA and has been authorized for detection and/or diagnosis of SARS-CoV-2 by FDA under an Emergency Use Authorization (EUA). This EUA will remain in effect (meaning this test can be used) for the duration of the COVID-19 declaration under Section 564(b)(1) of the Act, 21 U.S.C. section 360bbb-3(b)(1), unless the authorization is terminated or revoked.  Performed at [facility], [place] [address] [place], [place] [address]      Radiological Exams on Admission: CT Cervical Spine Wo Contrast  Result Date: 08/11/2020 CLINICAL DATA:  Worsening chills with shortness-of-breath and repeated falls at home. EXAM: CT CERVICAL SPINE WITHOUT CONTRAST TECHNIQUE: Multidetector CT imaging of the cervical spine was performed without intravenous contrast. Multiplanar CT image reconstructions were also generated. COMPARISON:  None. FINDINGS: Alignment: No posttraumatic subluxation. Skull base and vertebrae: Subtle anterior wedging of T1 and T2 likely chronic. Moderate spondylosis throughout the cervical spine to include uncovertebral joint spurring and severe  facet arthropathy. Possible partial fusion of the right C2-3 facets. Severe right-sided neural foraminal narrowing at the C2-3 level due to bony spurring. Severe bilateral neural foraminal narrowing from the C3-4 level to the C6-7 level. Chronic changes over the right T1-2 facets. No acute fracture visualized. Soft tissues and spinal canal: Prevertebral soft tissues are within normal. Narrowing of the right-side of the spinal canal at the C3-4 level due to adjacent bony spurring. Mild narrowing of the spinal canal at the C6-7 level due to posterior spurring.  Disc levels:  Disc space narrowing at the C5-6 and C6-7 levels. Upper chest: No acute findings. Other: None. IMPRESSION: 1. No acute cervical spine injury. 2. Moderate spondylosis throughout the cervical spine with multilevel disc disease and severe multilevel neural foraminal narrowing as described above. Mild spinal canal narrowing at the C6-7 level due to posterior spurring. 3. Subtle anterior wedging of T1 and T2 likely chronic. Electronically Signed   By: Marin Olp M.D.   On: 08/11/2020 12:09   DG Chest Port 1 View  Result Date: 08/06/2020 CLINICAL DATA:  Questionable sepsis EXAM: PORTABLE CHEST 1 VIEW COMPARISON:  07/28/2015 FINDINGS: Heart is mildly enlarged. Mild pulmonary vascular congestion. Interstitial opacity seen throughout both lungs. Atherosclerotic calcifications of the thoracic aorta. Large hiatal hernia. IMPRESSION: Mild cardiomegaly, pulmonary vascular congestion, and interstitial opacities most likely due to CHF/fluid volume overload. The interstitial opacities could represent atypical pneumonia. Electronically Signed   By: Miachel Roux M.D.   On: 08/09/2020 10:43     All images have been reviewed by me personally.  EKG: Independently reviewed.   Assessment/Plan Principal Problem:   Acute respiratory failure with hypoxia (HCC) Active Problems:   Hyperlipidemia   Subclinical hypothyroidism   Sepsis (HCC)   Lactic  acidosis   Community acquired pneumonia   Pulmonary vascular congestion   Severe aortic stenosis   Atrial flutter (HCC)   Hyponatremia   Acute renal failure superimposed on stage 4 chronic kidney disease (HCC)   History of anemia due to CKD   Primary hypertension   BPH (benign prostatic hyperplasia)   Fall at home, initial encounter   History of gout   Assessment  plan  #Acute hypoxia respiratory failure #Sepsis and lactic acidosis # community-acquired pneumonia #Pulmonary vascular congestion  Patient presented with fevers, SOB/DOE and cough.  He meets sepsis criteria.  Work-up suggested CAP. UA pending  -Admit to telemetry - sepsis protocol MAP > 65. Received NS bolus 500 ml x 1 at the ED and started on NS 75/h due to severe AS.  - lactic acid 2.6--> followup  -O2 supplement as needed 2 LNC -Infectious work-up-blood cultures are pending - urine antigens pending -  COVID negative - Flu negative - Respiratory biofire negative -Nebs and antitussin -Antibiotics- ceftriaxone and doxycycline    # known history of severe aortic stenosis # Hx of A flutter on Eliquis  -Last echo 02/11/19 showed normal LVEF and severe AS -Follow-up with cardiologist as outpatient -Continue Eliquis   # Hyponatremia  -Sodium 131 -Gentle hydration   #Acute on chronic renal failure # CKD stage IV--BL Cr 2.0-2.6 # Anemia of CKD # Chronic thrombocytopenia,   - BUN 43 and Cr 3.12 on admission - likely due to sepsis and infection - will obtain renal U/S rule out obstructive neuropathy - IVF-received NS bolus 500 cc at ED. we will start gentle IV fluid hydration 75/h x 12 and monitoring closely given his hx of severe AS. -Avoid nephrotoxins, hold home allopurinol - HGB at BL. PLT normal   # HTN and HLD  -chronic and stable -Continue home amlodipine, Toprol-XL and Zetia.   #History of BPH  -Noted -Continue home Flomax   # mechanical falls POA  Patient reports he fell a couple times due to  generalized weakness.  Denies syncope - related to his acute illnesses -Cervical spine CT showed no acute changes -Head CT is pending -PT eval   #Recent gout flareup to right foot #History of gout -He was seen by PCP on 06/10/2020,  and given prednisone 40 mg daily x5 days and Tylenol as needed.  Patient reports resolution of his gout  # hypothyroidism     Body mass index is 23.09 kg/m.        DVT prophylaxis: eliquis and SCD Code Status: Full Family Communication: son at bedside Consults called: none Admission status: inpt Status is: Inpatient  Remains inpatient appropriate because:Hemodynamically unstable   Dispo: The patient is from: Home              Anticipated d/c is to: Home              Patient currently is not medically stable to d/c.   Difficult to place patient No       Time Spent: 65 minutes.  >50% of the time was devoted to discussing the patients care, assessment, plan and disposition with other care givers along with counseling the patient about the risks and benefits of treatment.    Charlann Lange MD [facility]  If 7PM-7AM, please contact night-coverage   08/14/2020, 2:16 PM

## 2020-07-18 NOTE — Plan of Care (Signed)

## 2020-07-18 NOTE — ED Notes (Signed)
Attempted report x1. 

## 2020-07-18 NOTE — ED Triage Notes (Signed)
Patient arrives from home via EMS due to worsening chills, shortness of breath, and repeated falls at home. Per EMS, they were called out yesterday for the same, but patient refused to be transported.   On arrival patient was noted to be hypotensive as well ([id number] systolic). Heart rate 104-124.  Patient was given 1L of NS en route and placed on 2L for sob. Not vaccinated for covid.

## 2020-07-18 NOTE — Plan of Care (Signed)

## 2020-07-18 NOTE — ED Notes (Signed)
Called lab to have them add on the BNP that was ordered as an add on. Per lab they will add it on.

## 2020-07-18 NOTE — ED Provider Notes (Addendum)
[facility] EMERGENCY DEPARTMENT Provider Note   CSN: [id number] Arrival date & time: 08/12/2020  1018     History No chief complaint on file.   Richard Sullivan is a 87 y.o. male asthma history of anemia, atrial flutter, BPH, chronic renal insufficiency, depression brought in by EMS for evaluation of shortness of breath.  He states he has had trouble breathing for about a week or so.  He feels like it is gotten worse.  He is coughing.  He does not have any oxygen requirement at home.  He states his neck is also been hurting.  He denies any preceding trauma, injury.  He does not know if he has been having fever at home.  He states that he has not been vaccinated for COVID.  No sick exposures that he knows of.  Denies any chest pain, abdominal pain, nausea/vomiting, dysuria, hematuria  The history is provided by the patient.       Past Medical History:  Diagnosis Date  . Anemia of chronic renal failure   . Atrial flutter (HCC)    persistent, on anticoagulation  . BPH (benign prostatic hyperplasia)   . Chronic leukopenia    Neutropenia, monocytosis also with chronic thrombocytopenia--Dr. Marin Olp following; stable as of 03/16/16 f/u.  Marland Kitchen Chronic renal insufficiency, stage 4 (severe) (HCC)    Nephrosclerosis.  +Proteinuria.  GFR stable at 26 ml/min (sCr 2.21) as of 12/2017 neph f/u.  ?nephrotic syndrome in 40s?--no old records. Dr. Jimmy Footman, 2018, renal u/s- medical renal dz/Nephrosclerosis per renal (no RAS).  Cr 2.47/GFR 23 on 02/13/17 neph f/u.  K 5.5.  sCr stable at 2.04, K 4.25 Apr 2018 neph f/u.  Marland Kitchen Colon polyps    Colonoscopy x 3 per pt--initial one with polyp but two 5 yr f/u colonoscopies normal per pt.  . Depression   . Gout    R foot/MTP  . H/O exercise stress test    "normal"  . History of hyperkalemia 07/2016   cutting ACE-I dose in half helped this normalize.  +Recurrence 09/2016 (K 5.7 on neph f/u labs).  Elevated again 01/2017--started pt on scheduled  kayexalate.  . Hyperkalemia    Due to CKD stage 4 (improved when ACE-I d/c'd).  . Hyperlipidemia   . Hypertension    takes meds daily  . Leukopenia   . Microhematuria    Renal u/s 08/2013 remarkable only for small nonobstructing stone in each kidney.  Urol feels like this is likely the source of the microhem, cystoscopy likely low yield--following stones with q55moultrasounds and office f/u ([facility])  . Severe aortic stenosis    cardiologist recommended TAVR 09/2015 and again 01/2016--pt declines this procedure b/c he is asymptomatic. Rpt echo 01/2018 stable AV area and flow gradients (all in severe range). Next echo 01/2019 per cards.  . Subclinical hypothyroidism 06/2014   TSH 5.2, normal T4 and T3.  . Thrombocytopenia ([facility] [place]   . UTI (urinary tract infection) 02/2016   MSSA    Patient Active Problem List   Diagnosis Date Noted  . Hyperkalemia 08/02/2016  . Abnormal urine finding 03/03/2016  . Atrial fibrillation with rapid ventricular response ([facility]   . Bronchitis 07/28/2015  . Chronic bronchitis with acute exacerbation ([facility] 07/28/2015  . Tachyarrhythmia 07/28/2015  . Thrombocytopenia ([facility] 04/29/2015  . Left carotid bruit 06/24/2014  . Chronic kidney disease 06/24/2014  . Subclinical hypothyroidism 06/17/2014  . Preventative health care 12/13/2013  . Chronic leukopenia 12/13/2013  . Microscopic hematuria 08/13/2013  .  HTN (hypertension), benign 08/20/2012  . Hyperlipidemia 08/20/2012  . Chronic kidney disease (CKD), stage III (moderate) ([place]) 08/20/2012  . Heart murmur, systolic [id number]    Past Surgical History:  Procedure Laterality Date  . COLONOSCOPY    . EYE SURGERY     cataract - right  . INGUINAL HERNIA REPAIR  12/27/2011   Procedure: HERNIA REPAIR INGUINAL ADULT;  Surgeon: Odis Hollingshead, MD;  Location: [facility];  Service: General;  Laterality: Right;  . PROSTATE SURGERY     TURP 1980s/90s.--HELPED.  . TONSILLECTOMY    . TRANSTHORACIC ECHOCARDIOGRAM  07/22/14;  09/2015; 02/09/18; 01/2019   EF 55-60%, AS worse than prior echo: mean gradient 44, peak >100.  2017 f/u echo showed progression of AS to severe-to-critical. 01/2018 stable-->LVEF 60-65%, mild LVH, normal wall motion, severe AS, peak gradient 100, mean 63, AV area0.85cm. 01/2019 no change.       Family History  Problem Relation Age of Onset  . Arthritis Mother   . Hypertension Father     Social History   Tobacco Use  . Smoking status: Former Smoker    Packs/day: 1.00    Years: 32.00    Pack years: 32.00    Types: Cigarettes    Quit date: 12/05/1982    Years since quitting: 37.6  . Smokeless tobacco: Never Used  Vaping Use  . Vaping Use: Never used  Substance Use Topics  . Alcohol use: No    Alcohol/week: 0.0 standard drinks  . Drug use: No    Home Medications Prior to Admission medications   Medication Sig Start Date End Date Taking? Authorizing Provider  allopurinol (ZYLOPRIM) 100 MG tablet Take 100 mg by mouth daily. 04/18/19   [provider]  amitriptyline (ELAVIL) 50 MG tablet TAKE 1 TABLET BY MOUTH EVERYDAY AT BEDTIME 06/30/20   McGowen, Adrian Blackwater, MD  amLODipine (NORVASC) 5 MG tablet Take 1 tablet by mouth daily. 10/26/17   [provider]  clobetasol cream (TEMOVATE) [id number] % Apply 1 application topically 2 (two) times daily. 05/24/19   McGowen, Adrian Blackwater, MD  ELIQUIS 2.5 MG TABS tablet TAKE 1 TABLET BY MOUTH TWICE A DAY 04/08/20   Dunn, Lisbeth Renshaw N, PA-C  ezetimibe (ZETIA) 10 MG tablet TAKE 1 TABLET BY MOUTH EVERY DAY 06/30/20   McGowen, Adrian Blackwater, MD  metoprolol succinate (TOPROL-XL) 50 MG 24 hr tablet TAKE 1 TABLET BY MOUTH DAILY. TAKE WITH OR IMMEDIATELY FOLLOWING A MEAL. 06/30/20   McGowen, Adrian Blackwater, MD  tamsulosin (FLOMAX) 0.4 MG CAPS capsule TAKE 1 CAPSULE BY MOUTH EVERY DAY 06/30/20   McGowen, Adrian Blackwater, MD    Allergies    Patient has no known allergies.  Review of Systems   Review of Systems  Constitutional: Negative for fever.  Respiratory: Positive  for cough and shortness of breath.   Cardiovascular: Negative for chest pain.  Gastrointestinal: Negative for abdominal pain, nausea and vomiting.  Genitourinary: Negative for dysuria and hematuria.  Neurological: Negative for headaches.  All other systems reviewed and are negative.   Physical Exam Updated Vital Signs BP 126/73   Pulse (!) 102   Temp (!) 101.4 F (38.6 C) (Rectal)   Resp (!) 29   Wt 75.1 kg   SpO2 93%   BMI 23.09 kg/m   Physical Exam Vitals and nursing note reviewed.  Constitutional:      Comments: Frail and elderly appearing  HENT:     Head: Normocephalic and atraumatic.  Eyes:     General:  Lids are normal.     Conjunctiva/sclera: Conjunctivae normal.     Pupils: Pupils are equal, round, and reactive to light.  Neck:     Comments: Diffuse tenderness palpation of the posterior neck.  No deformity or crepitus noted. Cardiovascular:     Rate and Rhythm: Normal rate and regular rhythm.     Pulses: Normal pulses.     Heart sounds: Normal heart sounds. No murmur heard. No friction rub. No gallop.   Pulmonary:     Effort: Tachypnea present.     Comments: Tachypnea and increased work of breathing noted.  Rales noted at bilateral bases, right greater than left. Abdominal:     Palpations: Abdomen is soft. Abdomen is not rigid.     Tenderness: There is no abdominal tenderness. There is no guarding.     Comments: Abdomen is soft, non-distended, non-tender. No rigidity, No guarding. No peritoneal signs.  Musculoskeletal:        General: Normal range of motion.  Skin:    General: Skin is warm and dry.     Capillary Refill: Capillary refill takes less than 2 seconds.  Neurological:     Mental Status: He is alert and oriented to person, place, and time.  Psychiatric:        Speech: Speech normal.     ED Results / Procedures / Treatments   Labs (all labs ordered are listed, but only abnormal results are displayed) Labs Reviewed  LACTIC ACID, PLASMA -  Abnormal; Notable for the following components:      Result Value   Lactic Acid, Venous 2.6 (*)    All other components within normal limits  COMPREHENSIVE METABOLIC PANEL - Abnormal; Notable for the following components:   Sodium 131 (*)    CO2 14 (*)    Glucose, Bld 126 (*)    BUN 43 (*)    Creatinine, Ser 3.12 (*)    Total Protein 6.1 (*)    Albumin 2.5 (*)    AST 44 (*)    Total Bilirubin 1.8 (*)    GFR, Estimated 19 (*)    All other components within normal limits  CBC WITH DIFFERENTIAL/PLATELET - Abnormal; Notable for the following components:   WBC 12.1 (*)    Neutro Abs 11.2 (*)    Lymphs Abs 0.2 (*)    Abs Immature Granulocytes 0.11 (*)    All other components within normal limits  PROTIME-INR - Abnormal; Notable for the following components:   Prothrombin Time 18.5 (*)    INR 1.6 (*)    All other components within normal limits  APTT - Abnormal; Notable for the following components:   aPTT 41 (*)    All other components within normal limits  I-STAT CHEM 8, ED - Abnormal; Notable for the following components:   Sodium 133 (*)    BUN 39 (*)    Creatinine, Ser 3.00 (*)    Glucose, Bld 117 (*)    TCO2 17 (*)    Hemoglobin 12.6 (*)    HCT 37.0 (*)    All other components within normal limits  RESP PANEL BY RT-PCR (FLU A&B, COVID) ARPGX2  URINE CULTURE  CULTURE, BLOOD (ROUTINE X 2)  CULTURE, BLOOD (ROUTINE X 2)  LACTIC ACID, PLASMA  URINALYSIS, ROUTINE W REFLEX MICROSCOPIC  BRAIN NATRIURETIC PEPTIDE    EKG EKG Interpretation  Date/Time:  Saturday July 18 2020 10:31:45 EDT Ventricular Rate:  101 PR Interval:  154 QRS Duration: 123 QT Interval:  351 QTC Calculation: 455 R Axis:   -81 Text Interpretation: Sinus or ectopic atrial tachycardia Nonspecific IVCD with LAD Nonspecific T abnormalities, diffuse leads Confirmed by Blanchie Dessert [phone]) on 07/22/2020 11:08:36 AM   Radiology CT Cervical Spine Wo Contrast  Result Date: 08/10/2020 CLINICAL DATA:   Worsening chills with shortness-of-breath and repeated falls at home. EXAM: CT CERVICAL SPINE WITHOUT CONTRAST TECHNIQUE: Multidetector CT imaging of the cervical spine was performed without intravenous contrast. Multiplanar CT image reconstructions were also generated. COMPARISON:  None. FINDINGS: Alignment: No posttraumatic subluxation. Skull base and vertebrae: Subtle anterior wedging of T1 and T2 likely chronic. Moderate spondylosis throughout the cervical spine to include uncovertebral joint spurring and severe facet arthropathy. Possible partial fusion of the right C2-3 facets. Severe right-sided neural foraminal narrowing at the C2-3 level due to bony spurring. Severe bilateral neural foraminal narrowing from the C3-4 level to the C6-7 level. Chronic changes over the right T1-2 facets. No acute fracture visualized. Soft tissues and spinal canal: Prevertebral soft tissues are within normal. Narrowing of the right-side of the spinal canal at the C3-4 level due to adjacent bony spurring. Mild narrowing of the spinal canal at the C6-7 level due to posterior spurring. Disc levels:  Disc space narrowing at the C5-6 and C6-7 levels. Upper chest: No acute findings. Other: None. IMPRESSION: 1. No acute cervical spine injury. 2. Moderate spondylosis throughout the cervical spine with multilevel disc disease and severe multilevel neural foraminal narrowing as described above. Mild spinal canal narrowing at the C6-7 level due to posterior spurring. 3. Subtle anterior wedging of T1 and T2 likely chronic. Electronically Signed   By: Marin Olp M.D.   On: 08/14/2020 12:09   DG Chest Port 1 View  Result Date: 08/11/2020 CLINICAL DATA:  Questionable sepsis EXAM: PORTABLE CHEST 1 VIEW COMPARISON:  07/28/2015 FINDINGS: Heart is mildly enlarged. Mild pulmonary vascular congestion. Interstitial opacity seen throughout both lungs. Atherosclerotic calcifications of the thoracic aorta. Large hiatal hernia. IMPRESSION: Mild  cardiomegaly, pulmonary vascular congestion, and interstitial opacities most likely due to CHF/fluid volume overload. The interstitial opacities could represent atypical pneumonia. Electronically Signed   By: Miachel Roux M.D.   On: 07/17/2020 10:43    Procedures .Critical Care Performed by: Volanda Napoleon, PA-C Authorized by: Volanda Napoleon, PA-C   Critical care provider statement:    Critical care time (minutes):  35   Critical care was necessary to treat or prevent imminent or life-threatening deterioration of the following conditions:  Sepsis   Critical care was time spent personally by me on the following activities:  Discussions with consultants, evaluation of patient's response to treatment, examination of patient, ordering and performing treatments and interventions, ordering and review of laboratory studies, ordering and review of radiographic studies, pulse oximetry, re-evaluation of patient's condition, obtaining history from patient or surrogate and review of old charts     Medications Ordered in ED Medications  cefTRIAXone (ROCEPHIN) 1 g in sodium chloride 0.9 % 100 mL IVPB (has no administration in time range)  azithromycin (ZITHROMAX) 500 mg in sodium chloride 0.9 % 250 mL IVPB (has no administration in time range)  acetaminophen (TYLENOL) tablet 1,000 mg (1,000 mg Oral Given 07/30/2020 1057)  sodium chloride 0.9 % bolus 500 mL (500 mLs Intravenous New Bag/Given 08/02/2020 1050)    ED Course  I have reviewed the triage vital signs and the nursing notes.  Pertinent labs & imaging results that were available during my care of the patient were reviewed by  me and considered in my medical decision making (see chart for details).    MDM Rules/Calculators/A&P                          87 y.o. M BIB EMS for SOB that has been progressively worsening.  He has been coughing.  The shortness of breath worsened today which is what brought him to the emergency department.  On EMS  arrival, he had increased work of breathing.  He initially had some low O2 sats into the 80s.  They placed him on 3 L of O2 and improved him.  On initial arrival, he was 88% here.  We placed him on 3 L and that improved his O2 sats.  He is complaining of some neck pain.  No history of trauma.  He has some midline tenderness.  We will plan for imaging, labs, chest x-ray.  CMP shows BUN of 43, creatinine of 3.12.  He has a history of chronic kidney disease but mostly is in the 2 range.  CBC shows leukocytosis of 12.1.  Lactic is elevated at 2.6.  Covid is negative.  CXR shows mild cardiomegaly with pulmonary vascular congestion and interstitial opacities most likely due to CHF/volume overload. Lactic is elevated at 2.6.   CT C-spine shows no acute cervical spine injury.  Moderate spondylosis throughout the cervical spine with multilevel disc disease.  Patient started on antibiotics for pneumonia.  Given concern for sepsis, we will plan to admit him to the hospital.  Discussed patient with Dr. Nicoletta Dress who accepts patient for admission.   Portions of this note were generated with [employer]. Dictation errors may occur despite best attempts at proofreading.  Final Clinical Impression(s) / ED Diagnoses Final diagnoses:  Sepsis, due to unspecified organism, unspecified whether acute organ dysfunction present [facility])  Community acquired pneumonia, unspecified laterality    Rx / DC Orders ED Discharge Orders    None       Volanda Napoleon, PA-C 08/11/2020 1321    Desma Mcgregor 08/07/2020 1417    Blanchie Dessert, MD 08/10/2020 2115

## 2020-07-19 ENCOUNTER — Inpatient Hospital Stay (HOSPITAL_COMMUNITY): Payer: Medicare Other

## 2020-07-19 DIAGNOSIS — R7881 Bacteremia: Secondary | ICD-10-CM

## 2020-07-19 DIAGNOSIS — J9601 Acute respiratory failure with hypoxia: Secondary | ICD-10-CM | POA: Diagnosis not present

## 2020-07-19 DIAGNOSIS — B958 Unspecified staphylococcus as the cause of diseases classified elsewhere: Secondary | ICD-10-CM | POA: Diagnosis not present

## 2020-07-19 DIAGNOSIS — B9561 Methicillin susceptible Staphylococcus aureus infection as the cause of diseases classified elsewhere: Secondary | ICD-10-CM | POA: Diagnosis present

## 2020-07-19 DIAGNOSIS — Z515 Encounter for palliative care: Secondary | ICD-10-CM

## 2020-07-19 DIAGNOSIS — Z7189 Other specified counseling: Secondary | ICD-10-CM

## 2020-07-19 DIAGNOSIS — Z66 Do not resuscitate: Secondary | ICD-10-CM | POA: Diagnosis not present

## 2020-07-19 LAB — BASIC METABOLIC PANEL
Anion gap: 12 (ref 5–15)
BUN: 44 mg/dL — ABNORMAL HIGH (ref 8–23)
CO2: 15 mmol/L — ABNORMAL LOW (ref 22–32)
Calcium: 9.1 mg/dL (ref 8.9–10.3)
Chloride: 107 mmol/L (ref 98–111)
Creatinine, Ser: 2.96 mg/dL — ABNORMAL HIGH (ref 0.61–1.24)
GFR, Estimated: 20 mL/min — ABNORMAL LOW (ref >60)
Glucose, Bld: 117 mg/dL — ABNORMAL HIGH (ref 70–99)
Potassium: 4.3 mmol/L (ref 3.5–5.1)
Sodium: 134 mmol/L — ABNORMAL LOW (ref 135–145)

## 2020-07-19 LAB — ECHOCARDIOGRAM COMPLETE
AR max vel: 0.56 cm2
AV Area VTI: 0.68 cm2
AV Area mean vel: 0.54 cm2
AV Mean grad: 46 mmHg
AV Peak grad: 69.9 mmHg
Ao pk vel: 4.18 m/s
Area-P 1/2: 3.97 cm2
Height: 71 in
MV M vel: 5.16 m/s
MV Peak grad: 106.5 mmHg
P 1/2 time: 297 msec
S' Lateral: 2.4 cm
Weight: 2649.05 oz

## 2020-07-19 LAB — BLOOD CULTURE ID PANEL (REFLEXED) - BCID2

## 2020-07-19 LAB — CBC WITH DIFFERENTIAL/PLATELET
Abs Immature Granulocytes: 0.1 10*3/uL — ABNORMAL HIGH (ref 0.00–0.07)
Basophils Absolute: 0 10*3/uL (ref 0.0–0.1)
Basophils Relative: 0 %
Eosinophils Absolute: 0 10*3/uL (ref 0.0–0.5)
Eosinophils Relative: 0 %
HCT: 38.1 % — ABNORMAL LOW (ref 39.0–52.0)
Hemoglobin: 12.6 g/dL — ABNORMAL LOW (ref 13.0–17.0)
Immature Granulocytes: 1 %
Lymphocytes Relative: 1 %
Lymphs Abs: 0.2 10*3/uL — ABNORMAL LOW (ref 0.7–4.0)
MCH: 27.9 pg (ref 26.0–34.0)
MCHC: 33.1 g/dL (ref 30.0–36.0)
MCV: 84.3 fL (ref 80.0–100.0)
Monocytes Absolute: 0.2 10*3/uL (ref 0.1–1.0)
Monocytes Relative: 2 %
Neutro Abs: 11.4 10*3/uL — ABNORMAL HIGH (ref 1.7–7.7)
Neutrophils Relative %: 96 %
Platelets: 175 10*3/uL (ref 150–400)
RBC: 4.52 MIL/uL (ref 4.22–5.81)
RDW: 15.9 % — ABNORMAL HIGH (ref 11.5–15.5)
WBC: 12 10*3/uL — ABNORMAL HIGH (ref 4.0–10.5)
nRBC: 0 % (ref 0.0–0.2)

## 2020-07-19 MED ORDER — BUSPIRONE HCL 5 MG PO TABS
5.0000 mg | ORAL_TABLET | Freq: Every day | ORAL | Status: DC
  Administered 2020-07-19 – 2020-07-20 (×2): 5 mg via ORAL
  Filled 2020-07-19 (×3): qty 1

## 2020-07-19 MED ORDER — SODIUM CHLORIDE 0.9 % IV SOLN: INTRAVENOUS | Status: AC

## 2020-07-19 MED ORDER — LORAZEPAM 2 MG/ML IJ SOLN
0.5000 mg | Freq: Four times a day (QID) | INTRAMUSCULAR | Status: DC | PRN
  Administered 2020-07-19 – 2020-07-20 (×2): 0.5 mg via INTRAVENOUS
  Filled 2020-07-19 (×2): qty 1

## 2020-07-19 MED ORDER — CEFAZOLIN SODIUM-DEXTROSE 2-4 GM/100ML-% IV SOLN
2.0000 g | Freq: Two times a day (BID) | INTRAVENOUS | Status: DC
  Administered 2020-07-19 – 2020-07-24 (×11): 2 g via INTRAVENOUS
  Filled 2020-07-19 (×14): qty 100

## 2020-07-19 NOTE — Progress Notes (Signed)
Pt has been trying to get out of bed constantly saying he has to urinate. Pt had voided in urinal and in bed multiple times. Urinal within reach, but pt was anxious to be alone. Bed alarm on . Call bell within reach. Will continue to monitor.

## 2020-07-19 NOTE — Consult Note (Signed)
Palliative Medicine Inpatient Consult Note  Reason for consult:  Goals of Care  HPI:  Per intake H&P --> Richard Sullivan is a 87 y.o. male with medical history significant of severe aortic stenosis conservatively managed by cardiology, a flutter on Eliquis, HTN, HLD, CKD stage IV, anemia of chronic kidney failure, BPH, chronic thrombocytopenia, hypothyroidism, gout, depression, who presented with shortness of breath, cough and generalized weakness.   Palliative care has been asked to get involved to discuss goals of care in the setting of multiple co-morbidities.   Clinical Assessment/Goals of Care:  *Please note that this is a verbal dictation therefore any spelling or grammatical errors are due to the "[place] [facility]" system interpretation.  I have reviewed medical records including EPIC notes, labs and imaging, received report from bedside RN, assessed the patient who was working with PT. Able to get OOB and mobilize to recliner. .    I met with Bayard Hugger and we called his son, Antoin Dargis. On speakerphone to further discuss diagnosis prognosis, GOC, EOL wishes, disposition and options.   I introduced Palliative Medicine as specialized medical care for people living with serious illness. It focuses on providing relief from the symptoms and stress of a serious illness. The goal is to improve quality of life for both the patient and the family.  Discussed his severe aortic stenosis , CKD Stg IV, and Aflutter. Review of chronic disease and education progressive nature of these diseases was completed.   Richard Sullivan shares with me that he is from [place], [place]. He is a widower and was married for 23 years. He has one son, [name]. And no grandchildren. 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 He enjoys being in his yard and spending time with his son. He is a man of faith and practices within the [facility] denomination.   Prior to hospitalization Richard Sullivan  had been living in the home next to his son. He was performing all bADL's independently and still driving. He was not using a cane or a walker for mobility. Per Valinda Party. He had been doing well. In the evenings Valinda Party. Stays at his fathers.  A detailed discussion was had today regarding advanced directives we have none on file though [place] would rely on his son if he were ever incapcaitated.   Concepts specific to code status, artifical feeding and hydration, continued IV antibiotics and rehospitalization was had.  I completed a MOST form today as below:  Cardiopulmonary Resuscitation: Do Not Attempt Resuscitation (DNR/No CPR)  Medical Interventions: Limited Additional Interventions: Use medical treatment, IV fluids and cardiac monitoring as indicated, DO NOT USE intubation or mechanical ventilation. May consider use of less invasive airway support such as BiPAP or CPAP. Also provide comfort measures. Transfer to the hospital if indicated. Avoid intensive care.   Antibiotics: Determine use of limitation of antibiotics when infection occurs  IV Fluids: IV fluids for a defined trial period  Feeding Tube: No feeding tube   The difference between a aggressive medical intervention path  and a palliative comfort care path for this patient at this time was had. [place] is open to having OP Palliative support.  Neri' primary goal at this time is to gain strength.   Discussed the importance of continued conversation with family and their  medical providers regarding overall plan of care and treatment options, ensuring decisions are within the context of the patients values and GOCs.  Decision Maker: Lovie Macadamia. (son) [phone]  SUMMARY OF RECOMMENDATIONS   DNAR/DNI  MOST Completed, paper copy placed onto the chart electric copy can be found in [facility]  DNR Form Completed, paper copy placed onto the chart electric copy can be found in [facility]  TOC for OP Palliative support  Patient is  amenable to short term SNF stay  Code Status/Advance Care Planning: DNAR/DNI   Palliative Prophylaxis:   Oral Care, Mobility  Additional Recommendations (Limitations, Scope, Preferences):  Continue to treat what is treatable   Psycho-social/Spiritual:   Desire for further Chaplaincy support: Yes - [employer]  Additional Recommendations: Education on chronic diseases   Prognosis: > 12 month mortality risk  Discharge Planning: Discharge to SNF with OP Palliative support  Vitals:   07/19/20 0342 07/19/20 0751  BP: 115/63 116/70  Pulse: 98 94  Resp: (!) 24 (!) 22  Temp: 97.6 F (36.4 C) (!) 97.5 F (36.4 C)  SpO2: 100% 100%    Intake/Output Summary (Last 24 hours) at 07/19/2020 1115 Last data filed at 07/19/2020 1610 Gross per 24 hour  Intake 1690.92 ml  Output 1100 ml  Net 590.92 ml   Last Weight  Most recent update: 08/02/2020 12:57 PM   Weight  75.1 kg (165 lb 9.1 oz)          Gen:  Frail elderly M in NAD HEENT: moist mucous membranes CV: Irregular rate and rhythm  PULM: On RA ABD: soft/nontender  EXT: No edema, (+) ecchymosis  Neuro: Alert and oriented x3   PPS: 60%   This conversation/these recommendations were discussed with patient primary care team, Dr. Sloan Leiter  Time In: 1020 Time Out: 1130 Total Time: 70 Greater than 50%  of this time was spent counseling and coordinating care related to the above assessment and plan.  [facility] Team Team Cell Phone: [phone] Please utilize secure chat with additional questions, if there is no response within 30 minutes please call the above phone number  Palliative Medicine Team providers are available by phone from 7am to 7pm daily and can be reached through the team cell phone.  Should this patient require assistance outside of these hours, please call the patient's attending physician.

## 2020-07-19 NOTE — Consult Note (Signed)
[facility]       Reason for Consult: Staph bacteremia    Referring Physician: CHAMP autoconsult  Principal Problem:   Acute respiratory failure with hypoxia ([place]) Active Problems:   Hyperlipidemia   Subclinical hypothyroidism   Sepsis ([place])   Lactic acidosis   Community acquired pneumonia   Pulmonary vascular congestion   Severe aortic stenosis   Atrial flutter (HCC)   Hyponatremia   Acute renal failure superimposed on stage 4 chronic kidney disease (HCC)   History of anemia due to CKD   Primary hypertension   BPH (benign prostatic hyperplasia)   Fall at home, initial encounter   History of gout   Bacteremia due to methicillin susceptible Staphylococcus aureus (MSSA)   . amitriptyline  50 mg Oral QHS  . apixaban  2.5 mg Oral BID  . busPIRone  5 mg Oral q1800  . ezetimibe  10 mg Oral Daily  . feeding supplement  237 mL Oral BID BM  . metoprolol succinate  50 mg Oral Daily  . tamsulosin  0.4 mg Oral Daily    Recommendations:  repeat blood cultures - ordered TTE - done, read pending cefazolin  Assessment: He has MSSA bacteremia of unknown source.  No new back pain or other concerns.    Antibiotics: cefazolin  HPI: Richard Sullivan is a 87 y.o. male with a history of severe aortic stenosis, aflutter on Eliquis, HTN, HLD and stage 4 chronic kidney disease who presented with progressive SOB, dry cough and weakness.  He was febrile on admission to 101.4, WBC 12 and CXR c/w vascular congestion and lactate 2.6.  Concern for pneumonia on admission and started on doxycycline, azithromycin and ceftriaxone.  Blood cultures now positive as above and on cefazolin.  Palliative care has seen the patient and is now DNR.    Review of Systems:  Constitutional: positive for fevers, chills and fatigue Respiratory: positive for cough, negative for sputum or hemoptysis Gastrointestinal: negative for nausea and diarrhea Integument/breast: negative  for rash All other systems reviewed and are negative    Past Medical History:  Diagnosis Date  . Anemia of chronic renal failure   . Atrial flutter (HCC)    persistent, on anticoagulation  . BPH (benign prostatic hyperplasia)   . Chronic leukopenia    Neutropenia, monocytosis also with chronic thrombocytopenia--Dr. Marin Olp following; stable as of 03/16/16 f/u.  Marland Kitchen Chronic renal insufficiency, stage 4 (severe) (HCC)    Nephrosclerosis.  +Proteinuria.  GFR stable at 26 ml/min (sCr 2.21) as of 12/2017 neph f/u.  ?nephrotic syndrome in 90s?--no old records. Dr. Jimmy Footman, 2018, renal u/s- medical renal dz/Nephrosclerosis per renal (no RAS).  Cr 2.47/GFR 23 on 02/13/17 neph f/u.  K 5.5.  sCr stable at 2.04, K 4.25 Apr 2018 neph f/u.  Marland Kitchen Colon polyps    Colonoscopy x 3 per pt--initial one with polyp but two 5 yr f/u colonoscopies normal per pt.  . Depression   . Gout    R foot/MTP  . H/O exercise stress test    "normal"  . History of hyperkalemia 07/2016   cutting ACE-I dose in half helped this normalize.  +Recurrence 09/2016 (K 5.7 on neph f/u labs).  Elevated again 01/2017--started pt on scheduled kayexalate.  . Hyperkalemia    Due to CKD stage 4 (improved when ACE-I d/c'd).  . Hyperlipidemia   . Hypertension    takes meds daily  . Leukopenia   . Microhematuria    Renal u/s  08/2013 remarkable only for small nonobstructing stone in each kidney.  Urol feels like this is likely the source of the microhem, cystoscopy likely low yield--following stones with q38moultrasounds and office f/u ([facility])  . Severe aortic stenosis    cardiologist recommended TAVR 09/2015 and again 01/2016--pt declines this procedure b/c he is asymptomatic. Rpt echo 01/2018 stable AV area and flow gradients (all in severe range). Next echo 01/2019 per cards.  . Subclinical hypothyroidism 06/2014   TSH 5.2, normal T4 and T3.  . Thrombocytopenia ([place]   . UTI (urinary tract infection) 02/2016   MSSA    Social History    Tobacco Use  . Smoking status: Former Smoker    Packs/day: 1.00    Years: 32.00    Pack years: 32.00    Types: Cigarettes    Quit date: 12/05/1982    Years since quitting: 37.6  . Smokeless tobacco: Never Used  Vaping Use  . Vaping Use: Never used  Substance Use Topics  . Alcohol use: No    Alcohol/week: 0.0 standard drinks  . Drug use: No    Family History  Problem Relation Age of Onset  . Arthritis Mother   . Hypertension Father     No Known Allergies  Physical Exam: Constitutional: in no apparent distress  Vitals:   07/19/20 0751 07/19/20 1247  BP: 116/70 (!) 84/49  Pulse: 94 76  Resp: (!) 22 20  Temp: (!) 97.5 F (36.4 C) 97.7 F (36.5 C)  SpO2: 100% 100%   EYES: anicteric ENMT:no thrush Cardiovascular: Cor RRR, no murmur appreciated Respiratory: some increase in the respiratory rate; GI: Bowel sounds are normal, liver is not enlarged, spleen is not enlarged Musculoskeletal: no pedal edema noted Skin: negatives: no rash  Lab Results  Component Value Date   WBC 12.0 (H) 07/19/2020   HGB 12.6 (L) 07/19/2020   HCT 38.1 (L) 07/19/2020   MCV 84.3 07/19/2020   PLT 175 07/19/2020    Lab Results  Component Value Date   CREATININE 2.96 (H) 07/19/2020   BUN 44 (H) 07/19/2020   NA 134 (L) 07/19/2020   K 4.3 07/19/2020   CL 107 07/19/2020   CO2 15 (L) 07/19/2020    Lab Results  Component Value Date   ALT 14 08/11/2020   AST 44 (H) 08/02/2020   ALKPHOS 69 08/08/2020     Microbiology: Recent Results (from the past 240 hour(s))  Blood Culture (routine x 2)     Status: Abnormal (Preliminary result)   Collection Time: 08/13/2020  9:40 AM   Specimen: BLOOD RIGHT ARM  Result Value Ref Range Status   Specimen Description BLOOD RIGHT ARM  Final   Special Requests   Final    BOTTLES DRAWN AEROBIC AND ANAEROBIC Blood Culture adequate volume   Culture  Setup Time   Final    GRAM POSITIVE COCCI IN CLUSTERS IN BOTH AEROBIC AND ANAEROBIC BOTTLES Organism ID  to follow CRITICAL VALUE NOTED.  VALUE IS CONSISTENT WITH PREVIOUSLY REPORTED AND CALLED VALUE. Performed at [facility] [id number], [place]  [id number]   Culture STAPHYLOCOCCUS AUREUS (A)  Final   Report Status PENDING  Incomplete  Blood Culture (routine x 2)     Status: Abnormal (Preliminary result)   Collection Time: 08/04/2020  9:50 AM   Specimen: BLOOD RIGHT HAND  Result Value Ref Range Status   Specimen Description BLOOD RIGHT HAND  Final   Special Requests   Final  BOTTLES DRAWN AEROBIC AND ANAEROBIC Blood Culture results may not be optimal due to an inadequate volume of blood received in culture bottles   Culture  Setup Time   Final    GRAM POSITIVE COCCI IN CLUSTERS IN BOTH AEROBIC AND ANAEROBIC BOTTLES Organism ID to follow CRITICAL RESULT CALLED TO, READ BACK BY AND VERIFIED WITH: PHARMD LAURA SEAY BY MESSAN H. AT 0244 ON 07/26/2020    Culture (A)  Final    STAPHYLOCOCCUS AUREUS CULTURE REINCUBATED FOR BETTER GROWTH Performed at [facility], [place] [address], [place], [place] [address]    Report Status PENDING  Incomplete  Blood Culture ID Panel (Reflexed)     Status: Abnormal   Collection Time: 08/05/2020  9:50 AM  Result Value Ref Range Status   Enterococcus faecalis NOT DETECTED NOT DETECTED Final   Enterococcus Faecium NOT DETECTED NOT DETECTED Final   Listeria monocytogenes NOT DETECTED NOT DETECTED Final   Staphylococcus species DETECTED (A) NOT DETECTED Final    Comment: CRITICAL RESULT CALLED TO, READ BACK BY AND VERIFIED WITH: PHARMD LAURA SEAY BY MESSAN H. AT 0244 ON 07/27/2020    Staphylococcus aureus (BCID) DETECTED (A) NOT DETECTED Final    Comment: CRITICAL RESULT CALLED TO, READ BACK BY AND VERIFIED WITH: PHARMD LAURA SEAY BY MESSAN H. AT 0244 ON 07/22/2020    Staphylococcus epidermidis NOT DETECTED NOT DETECTED Final   Staphylococcus lugdunensis NOT DETECTED NOT DETECTED Final   Streptococcus species NOT DETECTED NOT DETECTED Final    Streptococcus agalactiae NOT DETECTED NOT DETECTED Final   Streptococcus pneumoniae NOT DETECTED NOT DETECTED Final   Streptococcus pyogenes NOT DETECTED NOT DETECTED Final   A.calcoaceticus-baumannii NOT DETECTED NOT DETECTED Final   Bacteroides fragilis NOT DETECTED NOT DETECTED Final   Enterobacterales NOT DETECTED NOT DETECTED Final   Enterobacter cloacae complex NOT DETECTED NOT DETECTED Final   Escherichia coli NOT DETECTED NOT DETECTED Final   Klebsiella aerogenes NOT DETECTED NOT DETECTED Final   Klebsiella oxytoca NOT DETECTED NOT DETECTED Final   Klebsiella pneumoniae NOT DETECTED NOT DETECTED Final   Proteus species NOT DETECTED NOT DETECTED Final   Salmonella species NOT DETECTED NOT DETECTED Final   Serratia marcescens NOT DETECTED NOT DETECTED Final   Haemophilus influenzae NOT DETECTED NOT DETECTED Final   Neisseria meningitidis NOT DETECTED NOT DETECTED Final   Pseudomonas aeruginosa NOT DETECTED NOT DETECTED Final   Stenotrophomonas maltophilia NOT DETECTED NOT DETECTED Final   Candida albicans NOT DETECTED NOT DETECTED Final   Candida auris NOT DETECTED NOT DETECTED Final   Candida glabrata NOT DETECTED NOT DETECTED Final   Candida krusei NOT DETECTED NOT DETECTED Final   Candida parapsilosis NOT DETECTED NOT DETECTED Final   Candida tropicalis NOT DETECTED NOT DETECTED Final   Cryptococcus neoformans/gattii NOT DETECTED NOT DETECTED Final   Meth resistant mecA/C and MREJ NOT DETECTED NOT DETECTED Final    Comment: Performed at [facility], [address] [place], [place] [address]  Resp Panel by RT-PCR (Flu A&B, Covid) Nasopharyngeal Swab     Status: None   Collection Time: 07/20/2020 10:31 AM   Specimen: Nasopharyngeal Swab; Nasopharyngeal(NP) swabs in vial transport medium  Result Value Ref Range Status   SARS Coronavirus 2 by RT PCR NEGATIVE NEGATIVE Final    Comment: (NOTE) SARS-CoV-2 target nucleic acids are NOT DETECTED.  The SARS-CoV-2 RNA is  generally detectable in upper respiratory specimens during the acute phase of infection. The lowest concentration of SARS-CoV-2 viral copies this  assay can detect is 138 copies/mL. A negative result does not preclude SARS-Cov-2 infection and should not be used as the sole basis for treatment or other patient management decisions. A negative result may occur with  improper specimen collection/handling, submission of specimen other than nasopharyngeal swab, presence of viral mutation(s) within the areas targeted by this assay, and inadequate number of viral copies(<138 copies/mL). A negative result must be combined with clinical observations, patient history, and epidemiological information. The expected result is Negative.  Fact Sheet for Patients:  EntrepreneurPulse.com.au  Fact Sheet for Healthcare Providers:  IncredibleEmployment.be  This test is no t yet approved or cleared by the [place] FDA and  has been authorized for detection and/or diagnosis of SARS-CoV-2 by FDA under an Emergency Use Authorization (EUA). This EUA will remain  in effect (meaning this test can be used) for the duration of the COVID-19 declaration under Section 564(b)(1) of the Act, 21 U.S.C.section 360bbb-3(b)(1), unless the authorization is terminated  or revoked sooner.       Influenza A by PCR NEGATIVE NEGATIVE Final   Influenza B by PCR NEGATIVE NEGATIVE Final    Comment: (NOTE) The Xpert Xpress SARS-CoV-2/FLU/RSV plus assay is intended as an aid in the diagnosis of influenza from Nasopharyngeal swab specimens and should not be used as a sole basis for treatment. Nasal washings and aspirates are unacceptable for Xpert Xpress SARS-CoV-2/FLU/RSV testing.  Fact Sheet for Patients: EntrepreneurPulse.com.au  Fact Sheet for Healthcare Providers: IncredibleEmployment.be  This test is not yet approved or cleared by the [place] FDA and has been authorized for detection and/or diagnosis of SARS-CoV-2 by FDA under an Emergency Use Authorization (EUA). This EUA will remain in effect (meaning this test can be used) for the duration of the COVID-19 declaration under Section 564(b)(1) of the Act, 21 U.S.C. section 360bbb-3(b)(1), unless the authorization is terminated or revoked.  Performed at [facility], [place] [address], [place], [place] [address]     Lamorris Knoblock W Citlali Gautney, [facility] [email or web address] 07/19/2020, 2:46 PM

## 2020-07-19 NOTE — Progress Notes (Signed)
PHARMACY - PHYSICIAN COMMUNICATION CRITICAL VALUE ALERT - BLOOD CULTURE IDENTIFICATION (BCID)  Richard Sullivan is an 87 y.o. male who presented to [facility] on 07/27/2020 with a chief complaint of worsening chills, shortness of breath, and repeated falls at home.  Assessment:  Admitted for acute hypoxia respiratory failure and started on ABX, now growing MSSA in 4 of 4 blood cx bottles, currently afebrile with no antipyretics since am, no longer hypotensive.  Name of physician (or Provider) Contacted: DOrtiz MD  Current antibiotics: azithro (x1), ceftriaxone, doxycycline  Changes to prescribed antibiotics recommended:  Recommendations accepted by provider -- change to Ancef  Results for orders placed or performed during the hospital encounter of 07/22/2020  Blood Culture ID Panel (Reflexed) (Collected: 07/30/2020  9:50 AM)  Result Value Ref Range   Enterococcus faecalis NOT DETECTED NOT DETECTED   Enterococcus Faecium NOT DETECTED NOT DETECTED   Listeria monocytogenes NOT DETECTED NOT DETECTED   Staphylococcus species DETECTED (A) NOT DETECTED   Staphylococcus aureus (BCID) DETECTED (A) NOT DETECTED   Staphylococcus epidermidis NOT DETECTED NOT DETECTED   Staphylococcus lugdunensis NOT DETECTED NOT DETECTED   Streptococcus species NOT DETECTED NOT DETECTED   Streptococcus agalactiae NOT DETECTED NOT DETECTED   Streptococcus pneumoniae NOT DETECTED NOT DETECTED   Streptococcus pyogenes NOT DETECTED NOT DETECTED   A.calcoaceticus-baumannii NOT DETECTED NOT DETECTED   Bacteroides fragilis NOT DETECTED NOT DETECTED   Enterobacterales NOT DETECTED NOT DETECTED   Enterobacter cloacae complex NOT DETECTED NOT DETECTED   Escherichia coli NOT DETECTED NOT DETECTED   Klebsiella aerogenes NOT DETECTED NOT DETECTED   Klebsiella oxytoca NOT DETECTED NOT DETECTED   Klebsiella pneumoniae NOT DETECTED NOT DETECTED   Proteus species NOT DETECTED NOT DETECTED   Salmonella species NOT DETECTED NOT  DETECTED   Serratia marcescens NOT DETECTED NOT DETECTED   Haemophilus influenzae NOT DETECTED NOT DETECTED   Neisseria meningitidis NOT DETECTED NOT DETECTED   Pseudomonas aeruginosa NOT DETECTED NOT DETECTED   Stenotrophomonas maltophilia NOT DETECTED NOT DETECTED   Candida albicans NOT DETECTED NOT DETECTED   Candida auris NOT DETECTED NOT DETECTED   Candida glabrata NOT DETECTED NOT DETECTED   Candida krusei NOT DETECTED NOT DETECTED   Candida parapsilosis NOT DETECTED NOT DETECTED   Candida tropicalis NOT DETECTED NOT DETECTED   Cryptococcus neoformans/gattii NOT DETECTED NOT DETECTED   Meth resistant mecA/C and MREJ NOT DETECTED NOT DETECTED    Wynona Neat, PharmD, BCPS  07/19/2020  2:54 AM

## 2020-07-19 NOTE — Progress Notes (Signed)
*  PRELIMINARY RESULTS* Echocardiogram 2D Echocardiogram has been performed.  Leavy Cella 07/19/2020, 2:50 PM

## 2020-07-19 NOTE — Evaluation (Signed)
Physical Therapy Evaluation Patient Details Name: Richard Sullivan MRN: [id number] DOB: Dec 06, 1930 Today's Date: 07/19/2020   History of Present Illness  Pt is an 87 y.o. male admitted 07/29/2020 with SOB, cough, generalized weakness, falls. Workup for acute hypoxic respiratory failure due to CHF/fluid overload; potential for atypical PNA. PMH includes severe aortic stenosis, aflutter on Eliquis, HTN, CKD IV, anemia, BPH, gout, depression.    Clinical Impression  Pt presents with an overall decrease in functional mobility secondary to above. PTA, pt independent, lives alone, drives; son lives next door and assists with yard maintenance. Today, pt able to initiate transfer and gait training with RW, requiring minA; mobility limited by fatigue and DOE. Pt would benefit from continued acute PT services to maximize functional mobility and independence prior to d/c with SNF-level therapies; pt in agreement.   SpO2 96-100% on RA, HR 80s-90s    Follow Up Recommendations SNF;Supervision for mobility/OOB    Equipment Recommendations  3in1 (PT)    Recommendations for Other Services       Precautions / Restrictions Precautions Precautions: Fall Restrictions Weight Bearing Restrictions: No      Mobility  Bed Mobility Overal bed mobility: Needs Assistance Bed Mobility: Supine to Sit     Supine to sit: Min assist;HOB elevated     General bed mobility comments: MinA for HHA to assist trunk elevation; once sitting, pt able to scoot self to EOB    Transfers Overall transfer level: Needs assistance Equipment used: Rolling walker (2 wheeled) Transfers: Sit to/from Stand Sit to Stand: Min assist         General transfer comment: Reliant on momentum and minA to power into standing; pt requesting use of RW when given option  Ambulation/Gait Ambulation/Gait assistance: Min assist;Min guard Gait Distance (Feet): 16 Feet Assistive device: Rolling walker (2 wheeled) Gait  Pattern/deviations: Step-to pattern;Step-through pattern;Decreased stride length;Trunk flexed Gait velocity: Decreased   General Gait Details: Slow, mildly unsteady gait with RW and intermittent minA for stability and RW navigation; pt requiring assist to navigate around objects on floor, cues to maintain upright posture and closer proximity to RW as pt frequently stepping outside of it; further distance limited by fatigue and SOB  Stairs            Wheelchair Mobility    Modified Rankin (Stroke Patients Only)       Balance Overall balance assessment: Needs assistance   Sitting balance-Leahy Scale: Fair       Standing balance-Leahy Scale: Poor Standing balance comment: Reliant on UE support or external assist                             Pertinent Vitals/Pain Pain Assessment: Faces Faces Pain Scale: Hurts a little bit Pain Location: Back, shoulders Pain Descriptors / Indicators: Sore Pain Intervention(s): Monitored during session;Repositioned    Home Living Family/patient expects to be discharged to:: Skilled nursing facility Living Arrangements: Alone Available Help at Discharge: Family;Available PRN/intermittently Type of Home: House       Home Layout: One level Home Equipment: Walker - 2 wheels Additional Comments: Son lives next door, will stay with pt in evenings    Prior Function Level of Independence: Independent         Comments: Pt reports independent without DME, still drives; falls leading to admission. Reports able to perform all household tasks and ADLs, but son assists with yard maintenance     Hand Dominance  Extremity/Trunk Assessment   Upper Extremity Assessment Upper Extremity Assessment: Generalized weakness    Lower Extremity Assessment Lower Extremity Assessment: Generalized weakness    Cervical / Trunk Assessment Cervical / Trunk Assessment: Kyphotic  Communication   Communication: HOH  Cognition  Arousal/Alertness: Awake/alert Behavior During Therapy: WFL for tasks assessed/performed Overall Cognitive Status: No family/caregiver present to determine baseline cognitive functioning Area of Impairment: Attention;Following commands;Awareness;Problem solving                   Current Attention Level: Sustained;Selective   Following Commands: Follows one step commands with increased time   Awareness: Emergent Problem Solving: Requires verbal cues General Comments: At times requiring increased time and cues to complete task; could be related to [place] comments (skin integrity, edema, etc.): HR 80s-90s, SpO2 96-100% on RA; DOE 3/4 with activity requiring seated rest    Exercises     Assessment/Plan    PT Assessment Patient needs continued PT services  PT Problem List Decreased strength;Decreased activity tolerance;Decreased balance;Decreased mobility;Decreased knowledge of use of DME;Decreased safety awareness;Cardiopulmonary status limiting activity       PT Treatment Interventions DME instruction;Gait training;Stair training;Functional mobility training;Therapeutic activities;Therapeutic exercise;Balance training;Patient/family education    PT Goals (Current goals can be found in the Care Plan section)  Acute Rehab PT Goals Patient Stated Goal: Regain strength; agreeable to post-acute rehab at SNF PT Goal Formulation: With patient Time For Goal Achievement: 08/02/20 Potential to Achieve Goals: Fair    Frequency Min 3X/week   Barriers to discharge Decreased caregiver support      Co-evaluation               AM-PAC PT "6 Clicks" Mobility  Outcome Measure Help needed turning from your back to your side while in a flat bed without using bedrails?: A Little Help needed moving from lying on your back to sitting on the side of a flat bed without using bedrails?: A Little Help needed moving to and from a bed to a chair (including a  wheelchair)?: A Little Help needed standing up from a chair using your arms (e.g., wheelchair or bedside chair)?: A Little Help needed to walk in hospital room?: A Little Help needed climbing 3-5 steps with a railing? : A Lot 6 Click Score: 17    End of Session Equipment Utilized During Treatment: Gait belt Activity Tolerance: Patient tolerated treatment well;Patient limited by fatigue Patient left: in chair;with call bell/phone within reach;with chair alarm set;Other (comment);with nursing/sitter in room (with palliative medicine present) Nurse Communication: Mobility status PT Visit Diagnosis: Other abnormalities of gait and mobility (R26.89);Muscle weakness (generalized) (M62.81)    Time: [id number] PT Time Calculation (min) (ACUTE ONLY): 20 min   Charges:   PT Evaluation $PT Eval Moderate Complexity: [place], PT, DPT Acute Rehabilitation Services  Pager [phone] Office [place] 07/19/2020, 12:11 PM

## 2020-07-19 NOTE — Progress Notes (Signed)
PROGRESS NOTE    KHUSH HORR  [id number] DOB: July 05, 1930 DOA: 07/25/2020 PCP: Tammi Sou, MD    Brief Narrative:  87 year old gentleman with history of severe critical aortic stenosis on conservative management not amenable to surgery, paroxysmal a flutter on Eliquis, hypertension, hyperlipidemia, chronic kidney disease stage IV, anemia of chronic disease, hypothyroidism, gout and depression presented with shortness of breath cough and generalized weakness for about 3 days.  Patient reports about 1 week not feeling well, nonproductive cough.  Symptoms worse for last 24 hours.  Son called EMS.  Patient lives at home with his son next door.  In the emergency room, temperature one 1.4.  Heart rate 104.  Respiratory 26.  On room air.  Lactic acid 2.6.  Covid and influenza negative.  Chest x-ray with mild cardiomegaly, pulmonary vascular congestion and interstitial opacities.  He was subsequently found to have AKI, MSSA bacteremia.  Primary source unknown.   Assessment & Plan:   Principal Problem:   Acute respiratory failure with hypoxia (HCC) Active Problems:   Hyperlipidemia   Subclinical hypothyroidism   Sepsis (HCC)   Lactic acidosis   Community acquired pneumonia   Pulmonary vascular congestion   Severe aortic stenosis   Atrial flutter (HCC)   Hyponatremia   Acute renal failure superimposed on stage 4 chronic kidney disease (HCC)   History of anemia due to CKD   Primary hypertension   BPH (benign prostatic hyperplasia)   Fall at home, initial encounter   History of gout   Bacteremia due to methicillin susceptible Staphylococcus aureus (MSSA)  Sepsis present on admission secondary MSSA bacteremia, primary source unknown, possible pneumonia.  Acute hypoxemic respiratory failure. Patient adequately resuscitated.  Blood pressures fairly stable today.  Lactic acid trending down.  He was given very conservative IV fluids with severe aortic stenosis and pulmonary edema  present. Initially on broad-spectrum antibiotics.  Changed to Ancef.  Await final cultures.  2D echocardiogram today to rule out endocarditis.  AKI on chronic kidney disease stage IV: Baseline creatinine reported about 2.  Presented with creatinine more than 3.  Gentle IV fluids and monitoring.  Monitor urine output.  History of severe aortic stenosis, a flutter on Eliquis: Currently sinus rhythm.  Rate controlled.  Severe aortic stenosis on conservative management.  This will put him in a very high risk of death.  Discussed with patient.  Updated patient's son.  Palliative care consulted.  Essential hypertension: Chronic and stable.  Discontinue amlodipine with risk of drop in blood pressure.  Metoprolol with parameters.   DVT prophylaxis: apixaban (ELIQUIS) tablet 2.5 mg Start: 08/11/2020 2200 SCDs Start: 07/19/2020 1412 SCDs Start: 07/17/2020 1407 apixaban (ELIQUIS) tablet 2.5 mg   Code Status: DNR Family Communication: Patient's son on the phone Disposition Plan: Status is: Inpatient  Remains inpatient appropriate because:Hemodynamically unstable, IV treatments appropriate due to intensity of illness or inability to take PO and Inpatient level of care appropriate due to severity of illness   Dispo: The patient is from: Home              Anticipated d/c is to: SNF              Patient currently is not medically stable to d/c.   Difficult to place patient No         Consultants:   Palliative care  Procedures:   None  Antimicrobials:  Antibiotics Given (last 72 hours)    Date/Time Action Medication Dose Rate   07/17/2020 1251  New Bag/Given   azithromycin (ZITHROMAX) 500 mg in sodium chloride 0.9 % 250 mL IVPB 500 mg 250 mL/hr   07/23/2020 1252 New Bag/Given   cefTRIAXone (ROCEPHIN) 1 g in sodium chloride 0.9 % 100 mL IVPB 1 g 200 mL/hr   08/04/2020 1622 Given   doxycycline (VIBRA-TABS) tablet 100 mg 100 mg    08/15/2020 2127 Given   doxycycline (VIBRA-TABS) tablet 100 mg 100 mg     07/19/20 1052 New Bag/Given   ceFAZolin (ANCEF) IVPB 2g/100 mL premix 2 g 200 mL/hr         Subjective: Patient seen and examined.  He was reported to be slightly anxious and impulsive by the nurses, on my interview he denies any complaints.  Denies any shortness of breath.  Feels weak.  Poor historian.  Denies any chest pain or shortness of breath at rest.  He has not been mobilized yet.  Afebrile overnight.  Objective: Vitals:   07/19/20 0055 07/19/20 0342 07/19/20 0751 07/19/20 1247  BP: 118/61 115/63 116/70 (!) 84/49  Pulse: 99 98 94 76  Resp: (!) 23 (!) 24 (!) 22 20  Temp: 97.6 F (36.4 C) 97.6 F (36.4 C) (!) 97.5 F (36.4 C) 97.7 F (36.5 C)  TempSrc: Axillary Oral Oral Oral  SpO2: 96% 100% 100% 100%  Weight:      Height:        Intake/Output Summary (Last 24 hours) at 07/19/2020 1335 Last data filed at 07/19/2020 0905 Gross per 24 hour  Intake 1690.92 ml  Output 1100 ml  Net 590.92 ml   Filed Weights   08/10/2020 1020 08/09/2020 1256  Weight: 75.1 kg 75.1 kg    Examination:  General exam: Appears calm and comfortable  Chronically sick looking.  Frail and debilitated. Respiratory system: Conducted airway sounds bilateral. Cardiovascular system: S1 & S2 heard, RRR.  Loud pansystolic murmur all over the precordium. Gastrointestinal system: Soft.  Nondistended.  Bowel sounds present. Central nervous system: Alert and oriented. No focal neurological deficits. Weak looking lethargic. Extremities: Symmetric 5 x 5 power.   Data Reviewed: I have personally reviewed following labs and imaging studies  CBC: Recent Labs  Lab 07/19/2020 0940 08/11/2020 1047 08/02/2020 1339 07/19/20 0203  WBC 12.1*  --   --  12.0*  NEUTROABS 11.2*  --   --  11.4*  HGB 13.0 12.6* 10.9* 12.6*  HCT 39.6 37.0* 32.0* 38.1*  MCV 85.5  --   --  84.3  PLT 177  --   --  0000000   Basic Metabolic Panel: Recent Labs  Lab 07/20/2020 0940 08/12/2020 1047 08/12/2020 1339 07/21/2020 1517 07/19/20 0203   NA 131* 133* 131* 131* 134*  K 4.9 5.0 4.6 4.8 4.3  CL 106 107  --  107 107  CO2 14*  --   --  13* 15*  GLUCOSE 126* 117*  --  102* 117*  BUN 43* 39*  --  43* 44*  CREATININE 3.12* 3.00*  --  3.04* 2.96*  CALCIUM 9.2  --   --  8.8* 9.1   GFR: Estimated Creatinine Clearance: 18.7 mL/min (A) (by C-G formula based on SCr of 2.96 mg/dL (H)). Liver Function Tests: Recent Labs  Lab 08/10/2020 0940  AST 44*  ALT 14  ALKPHOS 69  BILITOT 1.8*  PROT 6.1*  ALBUMIN 2.5*   No results for input(s): LIPASE, AMYLASE in the last 168 hours. No results for input(s): AMMONIA in the last 168 hours. Coagulation Profile: Recent Labs  Lab  08/06/2020 0940  INR 1.6*   Cardiac Enzymes: No results for input(s): CKTOTAL, CKMB, CKMBINDEX, TROPONINI in the last 168 hours. BNP (last 3 results) No results for input(s): PROBNP in the last 8760 hours. HbA1C: No results for input(s): HGBA1C in the last 72 hours. CBG: Recent Labs  Lab 08/08/2020 1654  GLUCAP 91   Lipid Profile: No results for input(s): CHOL, HDL, LDLCALC, TRIG, CHOLHDL, LDLDIRECT in the last 72 hours. Thyroid Function Tests: No results for input(s): TSH, T4TOTAL, FREET4, T3FREE, THYROIDAB in the last 72 hours. Anemia Panel: No results for input(s): VITAMINB12, FOLATE, FERRITIN, TIBC, IRON, RETICCTPCT in the last 72 hours. Sepsis Labs: Recent Labs  Lab 07/22/2020 0940  LATICACIDVEN 2.6*    Recent Results (from the past 240 hour(s))  Blood Culture (routine x 2)     Status: Abnormal (Preliminary result)   Collection Time: 08/14/2020  9:40 AM   Specimen: BLOOD RIGHT ARM  Result Value Ref Range Status   Specimen Description BLOOD RIGHT ARM  Final   Special Requests   Final    BOTTLES DRAWN AEROBIC AND ANAEROBIC Blood Culture adequate volume   Culture  Setup Time   Final    GRAM POSITIVE COCCI IN CLUSTERS IN BOTH AEROBIC AND ANAEROBIC BOTTLES Organism ID to follow CRITICAL VALUE NOTED.  VALUE IS CONSISTENT WITH PREVIOUSLY REPORTED  AND CALLED VALUE. Performed at [facility], [place] [address], [place], [place] [address]    Culture STAPHYLOCOCCUS AUREUS (A)  Final   Report Status PENDING  Incomplete  Blood Culture (routine x 2)     Status: Abnormal (Preliminary result)   Collection Time: 07/28/2020  9:50 AM   Specimen: BLOOD RIGHT HAND  Result Value Ref Range Status   Specimen Description BLOOD RIGHT HAND  Final   Special Requests   Final    BOTTLES DRAWN AEROBIC AND ANAEROBIC Blood Culture results may not be optimal due to an inadequate volume of blood received in culture bottles   Culture  Setup Time   Final    GRAM POSITIVE COCCI IN CLUSTERS IN BOTH AEROBIC AND ANAEROBIC BOTTLES Organism ID to follow CRITICAL RESULT CALLED TO, READ BACK BY AND VERIFIED WITH: PHARMD LAURA SEAY BY MESSAN H. AT 0244 ON 07/19/2020    Culture (A)  Final    STAPHYLOCOCCUS AUREUS CULTURE REINCUBATED FOR BETTER GROWTH Performed at [facility], [place] [address] [place], [place] [address]    Report Status PENDING  Incomplete  Blood Culture ID Panel (Reflexed)     Status: Abnormal   Collection Time: 07/30/2020  9:50 AM  Result Value Ref Range Status   Enterococcus faecalis NOT DETECTED NOT DETECTED Final   Enterococcus Faecium NOT DETECTED NOT DETECTED Final   Listeria monocytogenes NOT DETECTED NOT DETECTED Final   Staphylococcus species DETECTED (A) NOT DETECTED Final    Comment: CRITICAL RESULT CALLED TO, READ BACK BY AND VERIFIED WITH: PHARMD LAURA SEAY BY MESSAN H. AT 0244 ON 07/19/2020    Staphylococcus aureus (BCID) DETECTED (A) NOT DETECTED Final    Comment: CRITICAL RESULT CALLED TO, READ BACK BY AND VERIFIED WITH: PHARMD LAURA SEAY BY MESSAN H. AT 0244 ON 08/09/2020    Staphylococcus epidermidis NOT DETECTED NOT DETECTED Final   Staphylococcus lugdunensis NOT DETECTED NOT DETECTED Final   Streptococcus species NOT DETECTED NOT DETECTED Final   Streptococcus agalactiae NOT DETECTED NOT DETECTED Final   Streptococcus  pneumoniae NOT DETECTED NOT DETECTED Final   Streptococcus pyogenes NOT DETECTED NOT DETECTED  Final   A.calcoaceticus-baumannii NOT DETECTED NOT DETECTED Final   Bacteroides fragilis NOT DETECTED NOT DETECTED Final   Enterobacterales NOT DETECTED NOT DETECTED Final   Enterobacter cloacae complex NOT DETECTED NOT DETECTED Final   Escherichia coli NOT DETECTED NOT DETECTED Final   Klebsiella aerogenes NOT DETECTED NOT DETECTED Final   Klebsiella oxytoca NOT DETECTED NOT DETECTED Final   Klebsiella pneumoniae NOT DETECTED NOT DETECTED Final   Proteus species NOT DETECTED NOT DETECTED Final   Salmonella species NOT DETECTED NOT DETECTED Final   Serratia marcescens NOT DETECTED NOT DETECTED Final   Haemophilus influenzae NOT DETECTED NOT DETECTED Final   Neisseria meningitidis NOT DETECTED NOT DETECTED Final   Pseudomonas aeruginosa NOT DETECTED NOT DETECTED Final   Stenotrophomonas maltophilia NOT DETECTED NOT DETECTED Final   Candida albicans NOT DETECTED NOT DETECTED Final   Candida auris NOT DETECTED NOT DETECTED Final   Candida glabrata NOT DETECTED NOT DETECTED Final   Candida krusei NOT DETECTED NOT DETECTED Final   Candida parapsilosis NOT DETECTED NOT DETECTED Final   Candida tropicalis NOT DETECTED NOT DETECTED Final   Cryptococcus neoformans/gattii NOT DETECTED NOT DETECTED Final   Meth resistant mecA/C and MREJ NOT DETECTED NOT DETECTED Final    Comment: Performed at [facility], [address], [place], [place] [address]  Resp Panel by RT-PCR (Flu A&B, Covid) Nasopharyngeal Swab     Status: None   Collection Time: 07/20/2020 10:31 AM   Specimen: Nasopharyngeal Swab; Nasopharyngeal(NP) swabs in vial transport medium  Result Value Ref Range Status   SARS Coronavirus 2 by RT PCR NEGATIVE NEGATIVE Final    Comment: (NOTE) SARS-CoV-2 target nucleic acids are NOT DETECTED.  The SARS-CoV-2 RNA is generally detectable in upper respiratory specimens during the acute phase of  infection. The lowest concentration of SARS-CoV-2 viral copies this assay can detect is 138 copies/mL. A negative result does not preclude SARS-Cov-2 infection and should not be used as the sole basis for treatment or other patient management decisions. A negative result may occur with  improper specimen collection/handling, submission of specimen other than nasopharyngeal swab, presence of viral mutation(s) within the areas targeted by this assay, and inadequate number of viral copies(<138 copies/mL). A negative result must be combined with clinical observations, patient history, and epidemiological information. The expected result is Negative.  Fact Sheet for Patients:  EntrepreneurPulse.com.au  Fact Sheet for Healthcare Providers:  IncredibleEmployment.be  This test is no t yet approved or cleared by the [place] FDA and  has been authorized for detection and/or diagnosis of SARS-CoV-2 by FDA under an Emergency Use Authorization (EUA). This EUA will remain  in effect (meaning this test can be used) for the duration of the COVID-19 declaration under Section 564(b)(1) of the Act, 21 U.S.C.section 360bbb-3(b)(1), unless the authorization is terminated  or revoked sooner.       Influenza A by PCR NEGATIVE NEGATIVE Final   Influenza B by PCR NEGATIVE NEGATIVE Final    Comment: (NOTE) The Xpert Xpress SARS-CoV-2/FLU/RSV plus assay is intended as an aid in the diagnosis of influenza from Nasopharyngeal swab specimens and should not be used as a sole basis for treatment. Nasal washings and aspirates are unacceptable for Xpert Xpress SARS-CoV-2/FLU/RSV testing.  Fact Sheet for Patients: EntrepreneurPulse.com.au  Fact Sheet for Healthcare Providers: IncredibleEmployment.be  This test is not yet approved or cleared by the [place] FDA and has been authorized for detection and/or diagnosis of SARS-CoV-2  by FDA under an Emergency Use  Authorization (EUA). This EUA will remain in effect (meaning this test can be used) for the duration of the COVID-19 declaration under Section 564(b)(1) of the Act, 21 U.S.C. section 360bbb-3(b)(1), unless the authorization is terminated or revoked.  Performed at [facility], [place] [address], [place], [place] [address]          Radiology Studies: CT HEAD WO CONTRAST  Result Date: 07/17/2020 CLINICAL DATA:  Fall. EXAM: CT HEAD WITHOUT CONTRAST TECHNIQUE: Contiguous axial images were obtained from the base of the skull through the vertex without intravenous contrast. COMPARISON:  None. FINDINGS: Brain: Bilateral mixed density subdural collections measuring up to approximately 5 mm on the right and 6 mm on the left. There is some amorphous hyperdensity within the left greater than right collection (for example series 5, image 31) which is suggestive of acute/recent hemorrhage. No substantial mass effect due to the patient's underlying cerebral volume loss. No midline shift. Basal cisterns are patent. No evidence of acute intraparenchymal hemorrhage. No evidence of acute large vascular territory infarct. No mass lesion. No hydrocephalus. Patchy white matter hypoattenuation, nonspecific but most likely related to chronic microvascular ischemic disease. Vascular: No hyperdense vessel identified. Calcific atherosclerosis. Skull: No acute fracture. Sinuses/Orbits: Small retention cyst in the inferior left maxillary sinus. Otherwise, no substantial paranasal sinus disease. Unremarkable orbits. Other: No mastoid effusions. 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 Amorphous hyperdensity within the left greater than right collections is suggestive of acute/recent hemorrhage within the collections. No substantial mass effect due to the  patient's underlying cerebral volume loss. No midline shift. Electronically Signed   By: Margaretha Sheffield MD   On: 08/08/2020 14:30   CT Cervical Spine Wo Contrast  Result Date: 08/13/2020 CLINICAL DATA:  Worsening chills with shortness-of-breath and repeated falls at home. EXAM: CT CERVICAL SPINE WITHOUT CONTRAST TECHNIQUE: Multidetector CT imaging of the cervical spine was performed without intravenous contrast. Multiplanar CT image reconstructions were also generated. COMPARISON:  None. FINDINGS: Alignment: No posttraumatic subluxation. Skull base and vertebrae: Subtle anterior wedging of T1 and T2 likely chronic. Moderate spondylosis throughout the cervical spine to include uncovertebral joint spurring and severe facet arthropathy. Possible partial fusion of the right C2-3 facets. Severe right-sided neural foraminal narrowing at the C2-3 level due to bony spurring. Severe bilateral neural foraminal narrowing from the C3-4 level to the C6-7 level. Chronic changes over the right T1-2 facets. No acute fracture visualized. Soft tissues and spinal canal: Prevertebral soft tissues are within normal. Narrowing of the right-side of the spinal canal at the C3-4 level due to adjacent bony spurring. Mild narrowing of the spinal canal at the C6-7 level due to posterior spurring. Disc levels:  Disc space narrowing at the C5-6 and C6-7 levels. Upper chest: No acute findings. Other: None. IMPRESSION: 1. No acute cervical spine injury. 2. Moderate spondylosis throughout the cervical spine with multilevel disc disease and severe multilevel neural foraminal narrowing as described above. Mild spinal canal narrowing at the C6-7 level due to posterior spurring. 3. Subtle anterior wedging of T1 and T2 likely chronic. Electronically Signed   By: Marin Olp M.D.   On: 07/17/2020 12:09   US RENAL  Result Date: 07/29/2020 CLINICAL DATA:  Renal failure EXAM: RENAL / URINARY TRACT ULTRASOUND COMPLETE COMPARISON:  June 24, 2016  FINDINGS: Right Kidney: Renal measurements: 11.3 x 4.8 x 4.4 cm = volume:  124.2 mL. Contains a 1.4 x 1.1 x 1.0 cm mass and a separate 1.5 x 1.4 x 1.1 cm mass. Neither definitely cystic based on today's imaging in either was seen previously. Increased cortical echogenicity. Left Kidney: Renal measurements: 10.3 x 4.7 x 4.3 cm = volume: 107.6 mL. Contains a 1.6 x 1.2 x 0.9 cm hypoechoic mass. Increased cortical echogenicity. Bladder: A left-sided ureterocele is not excluded. The bladder is otherwise normal. Other: None. IMPRESSION: 1. There are 2 masses in the right kidney and 1 on the left. The largest mass on the right measures 1.5 cm and the largest mass on the left measures 1.6 cm. These masses are not definitively cystic on provided imaging. MRI is recommended for further evaluation. The MRI should be performed in the nonacute setting. 2. Medical renal disease. 3. A left-sided ureterocele is not excluded. Electronically Signed   By: Dorise Bullion III M.D   On: 08/08/2020 15:02   DG Chest Port 1 View  Result Date: 07/17/2020 CLINICAL DATA:  Questionable sepsis EXAM: PORTABLE CHEST 1 VIEW COMPARISON:  07/28/2015 FINDINGS: Heart is mildly enlarged. Mild pulmonary vascular congestion. Interstitial opacity seen throughout both lungs. Atherosclerotic calcifications of the thoracic aorta. Large hiatal hernia. IMPRESSION: Mild cardiomegaly, pulmonary vascular congestion, and interstitial opacities most likely due to CHF/fluid volume overload. The interstitial opacities could represent atypical pneumonia. Electronically Signed   By: Miachel Roux M.D.   On: 08/07/2020 10:43        Scheduled Meds: . amitriptyline  50 mg Oral QHS  . apixaban  2.5 mg Oral BID  . busPIRone  5 mg Oral q1800  . ezetimibe  10 mg Oral Daily  . feeding supplement  237 mL Oral BID BM  . metoprolol succinate  50 mg Oral Daily  . tamsulosin  0.4 mg Oral Daily   Continuous Infusions: . sodium chloride    .  ceFAZolin (ANCEF) IV 2  g (07/19/20 1052)     LOS: 1 day    Time spent: 35 minutes    Barb Merino, MD [facility] Pager [phone]

## 2020-07-20 ENCOUNTER — Inpatient Hospital Stay (HOSPITAL_COMMUNITY): Payer: Medicare Other

## 2020-07-20 DIAGNOSIS — B9562 Methicillin resistant Staphylococcus aureus infection as the cause of diseases classified elsewhere: Secondary | ICD-10-CM

## 2020-07-20 DIAGNOSIS — R410 Disorientation, unspecified: Secondary | ICD-10-CM | POA: Diagnosis not present

## 2020-07-20 DIAGNOSIS — J9601 Acute respiratory failure with hypoxia: Secondary | ICD-10-CM | POA: Diagnosis not present

## 2020-07-20 DIAGNOSIS — R7881 Bacteremia: Secondary | ICD-10-CM | POA: Diagnosis not present

## 2020-07-20 DIAGNOSIS — E43 Unspecified severe protein-calorie malnutrition: Secondary | ICD-10-CM | POA: Insufficient documentation

## 2020-07-20 DIAGNOSIS — Z66 Do not resuscitate: Secondary | ICD-10-CM

## 2020-07-20 LAB — CBC WITH DIFFERENTIAL/PLATELET
Abs Immature Granulocytes: 0.15 10*3/uL — ABNORMAL HIGH (ref 0.00–0.07)
Basophils Absolute: 0 10*3/uL (ref 0.0–0.1)
Basophils Relative: 0 %
Eosinophils Absolute: 0 10*3/uL (ref 0.0–0.5)
Eosinophils Relative: 0 %
HCT: 40.3 % (ref 39.0–52.0)
Hemoglobin: 13.6 g/dL (ref 13.0–17.0)
Immature Granulocytes: 1 %
Lymphocytes Relative: 2 %
Lymphs Abs: 0.2 10*3/uL — ABNORMAL LOW (ref 0.7–4.0)
MCH: 28.2 pg (ref 26.0–34.0)
MCHC: 33.7 g/dL (ref 30.0–36.0)
MCV: 83.4 fL (ref 80.0–100.0)
Monocytes Absolute: 0.3 10*3/uL (ref 0.1–1.0)
Monocytes Relative: 2 %
Neutro Abs: 12.8 10*3/uL — ABNORMAL HIGH (ref 1.7–7.7)
Neutrophils Relative %: 95 %
Platelets: 184 10*3/uL (ref 150–400)
RBC: 4.83 MIL/uL (ref 4.22–5.81)
RDW: 16 % — ABNORMAL HIGH (ref 11.5–15.5)
WBC: 13.5 10*3/uL — ABNORMAL HIGH (ref 4.0–10.5)
nRBC: 0 % (ref 0.0–0.2)

## 2020-07-20 LAB — BASIC METABOLIC PANEL
Anion gap: 12 (ref 5–15)
BUN: 53 mg/dL — ABNORMAL HIGH (ref 8–23)
CO2: 15 mmol/L — ABNORMAL LOW (ref 22–32)
Calcium: 9.7 mg/dL (ref 8.9–10.3)
Chloride: 111 mmol/L (ref 98–111)
Creatinine, Ser: 2.5 mg/dL — ABNORMAL HIGH (ref 0.61–1.24)
GFR, Estimated: 24 mL/min — ABNORMAL LOW (ref >60)
Glucose, Bld: 136 mg/dL — ABNORMAL HIGH (ref 70–99)
Potassium: 3.9 mmol/L (ref 3.5–5.1)
Sodium: 138 mmol/L (ref 135–145)

## 2020-07-20 LAB — MAGNESIUM: Magnesium: 1.8 mg/dL (ref 1.7–2.4)

## 2020-07-20 LAB — PHOSPHORUS: Phosphorus: 3.6 mg/dL (ref 2.5–4.6)

## 2020-07-20 MED ORDER — ADULT MULTIVITAMIN W/MINERALS CH
1.0000 | ORAL_TABLET | Freq: Every day | ORAL | Status: DC
  Administered 2020-07-22 – 2020-07-24 (×3): 1 via ORAL
  Filled 2020-07-20 (×4): qty 1

## 2020-07-20 NOTE — Progress Notes (Signed)
Physical Therapy Treatment Patient Details Name: Richard Sullivan MRN: [id number] DOB: August 03, 1930 Today's Date: 07/20/2020    History of Present Illness Pt is an 87 y.o. male admitted 08/01/2020 with SOB, cough, generalized weakness, falls. Workup for acute hypoxic respiratory failure due to CHF/fluid overload; potential for atypical PNA. 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 Pt with worsening confusion; repeat head CT 4/4 with minimal enlargement of subdural collections, consistent with minimal additional subdural bleeding, mild mass-effect, no midline shift. PMH includes severe aortic stenosis, aflutter on Eliquis, HTN, CKD IV, anemia, BPH, gout, depression.   PT Comments    Pt with worsening confusion/AMS this session. Pt A&Ox3-4, but with increased restlessness, difficulty sequencing and decreased attention. Pt tolerated prolonged seated activity and short bout of standing/taking steps with RW, requiring modA for mobility. Continue to recommend SNF-level therapies to maximize functional mobility and independence.    Follow Up Recommendations  SNF;Supervision/Assistance - 24 hour     Equipment Recommendations  3in1 (PT);Wheelchair (measurements PT);Wheelchair cushion (measurements PT)    Recommendations for Other Services       Precautions / Restrictions Precautions Precautions: Fall;Other (comment) Precaution Comments: Bilateral soft mitts Restrictions Weight Bearing Restrictions: No    Mobility  Bed Mobility Overal bed mobility: Needs Assistance Bed Mobility: Supine to Sit;Sit to Supine     Supine to sit: Mod assist;HOB elevated Sit to supine: Min assist   General bed mobility comments: ModA for BLE management and scooting hips to EOB, minA for trnk elevation; minA for repositioning with return to supine    Transfers Overall transfer level: Needs assistance Equipment used: Rolling walker (2 wheeled) Transfers: Sit  to/from Stand Sit to Stand: Mod assist         General transfer comment: ModA to assist trunk elevation standing from EOB to RW, pt requiring increased verbal and tactile cues for sequencing and hand placement with RW, modA to stabilize RW  Ambulation/Gait Ambulation/Gait assistance: Min assist Gait Distance (Feet): 2 Feet Assistive device: Rolling walker (2 wheeled) Gait Pattern/deviations: Step-to pattern;Trunk flexed;Leaning posteriorly;Shuffle Gait velocity: Decreased   General Gait Details: Pt able to take side steps towards [facility] with RW and minA for stability, pt with difficulty sequencing sideways steps(?) therefore turning and walking forwards then turning back to sit on bed, minA for RW management; max verbal cues   Stairs             Wheelchair Mobility    Modified Rankin (Stroke Patients Only)       Balance Overall balance assessment: Needs assistance   Sitting balance-Leahy Scale: Fair       Standing balance-Leahy Scale: Poor Standing balance comment: Reliant on UE support and external assist                            Cognition Arousal/Alertness: Awake/alert Behavior During Therapy: Restless Overall Cognitive Status: No family/caregiver present to determine baseline cognitive functioning Area of Impairment: Attention;Following commands;Safety/judgement;Awareness;Problem solving                   Current Attention Level: Focused;Sustained   Following Commands: Follows one step commands with increased time Safety/Judgement: Decreased awareness of deficits Awareness: Intellectual;Emergent Problem Solving: Difficulty sequencing;Requires verbal cues General Comments: Pt with worsening AMS today; increased restlessness and decreased attention during session. Alert to self, location, year/month and reason for admission ("I have some sort of infection"). Following majority of simple commands with  increased cues. Cues to redirect from  [place] with lines/mitts      Exercises      General Comments General comments (skin integrity, edema, etc.): SpO2 95-100% on RA, HR 102-120s; post-mobility BP 146/69. Return to supine at end of session due to confusion and restlessness. Son then present at end of session, educ on pt's wearing bilateral soft mitts      Pertinent Vitals/Pain Pain Assessment: Faces Faces Pain Scale: Hurts a little bit Pain Location: Neck, shoulders Pain Descriptors / Indicators: Discomfort;Guarding Pain Intervention(s): Monitored during session    Home Living                      Prior Function            PT Goals (current goals can now be found in the care plan section) Progress towards PT goals: Not progressing toward goals - comment (worsening confusion this session)    Frequency    Min 2X/week      PT Plan Discharge plan needs to be updated;Frequency needs to be updated    Co-evaluation              AM-PAC PT "6 Clicks" Mobility   Outcome Measure  Help needed turning from your back to your side while in a flat bed without using bedrails?: A Lot Help needed moving from lying on your back to sitting on the side of a flat bed without using bedrails?: A Lot Help needed moving to and from a bed to a chair (including a wheelchair)?: A Lot Help needed standing up from a chair using your arms (e.g., wheelchair or bedside chair)?: A Lot Help needed to walk in hospital room?: A Lot Help needed climbing 3-5 steps with a railing? : A Lot 6 Click Score: 12    End of Session Equipment Utilized During Treatment: Gait belt Activity Tolerance: Patient tolerated treatment well;Patient limited by fatigue Patient left: in bed;with call bell/phone within reach;with bed alarm set;Other (comment) (bilateral soft mitts replaced) Nurse Communication: Mobility status PT Visit Diagnosis: Other abnormalities of gait and mobility (R26.89);Muscle weakness (generalized) (M62.81)     Time:  [id number] PT Time Calculation (min) (ACUTE ONLY): 25 min  Charges:  $Therapeutic Activity: 23-37 mins                     Mabeline Caras, PT, DPT Acute Rehabilitation Services  Pager [phone] Office [place] 07/20/2020, 5:08 PM

## 2020-07-20 NOTE — Progress Notes (Signed)
[facility] for Infectious [facility]   Reason for visit: Follow up on bacteremia  Interval History: WBC 13.5, has been afebrile since initial fever on admission.   Day 3 total antibiotics Day 2 cefazolin  Physical Exam: Constitutional:  Vitals:   07/20/20 0300 07/20/20 0800  BP:  100/76  Pulse:  91  Resp: 20 19  Temp:  98.1 F (36.7 C)  SpO2:  97%   patient appears in NAD Eyes: anicteric Respiratory: increased respiratory effort; mild wheezes Cardiovascular: RRR Neuro: confused, not able to answer questions.  Review of Systems: Unable to be assessed due to mental status  Lab Results  Component Value Date   WBC 13.5 (H) 07/20/2020   HGB 13.6 07/20/2020   HCT 40.3 07/20/2020   MCV 83.4 07/20/2020   PLT 184 07/20/2020    Lab Results  Component Value Date   CREATININE 2.96 (H) 07/19/2020   BUN 44 (H) 07/19/2020   NA 134 (L) 07/19/2020   K 4.3 07/19/2020   CL 107 07/19/2020   CO2 15 (L) 07/19/2020    Lab Results  Component Value Date   ALT 14 07/17/2020   AST 44 (H) 08/05/2020   ALKPHOS 69 07/21/2020     Microbiology: Recent Results (from the past 240 hour(s))  Blood Culture (routine x 2)     Status: Abnormal (Preliminary result)   Collection Time: 08/07/2020  9:40 AM   Specimen: BLOOD RIGHT ARM  Result Value Ref Range Status   Specimen Description BLOOD RIGHT ARM  Final   Special Requests   Final    BOTTLES DRAWN AEROBIC AND ANAEROBIC Blood Culture adequate volume   Culture  Setup Time   Final    GRAM POSITIVE COCCI IN CLUSTERS IN BOTH AEROBIC AND ANAEROBIC BOTTLES Organism ID to follow CRITICAL VALUE NOTED.  VALUE IS CONSISTENT WITH PREVIOUSLY REPORTED AND CALLED VALUE. Performed at [facility], [place] [address], [place], [place] [address]    Culture STAPHYLOCOCCUS AUREUS (A)  Final   Report Status PENDING  Incomplete  Blood Culture (routine x 2)     Status: Abnormal (Preliminary result)   Collection Time: 07/26/2020  9:50 AM   Specimen:  BLOOD RIGHT HAND  Result Value Ref Range Status   Specimen Description BLOOD RIGHT HAND  Final   Special Requests   Final    BOTTLES DRAWN AEROBIC AND ANAEROBIC Blood Culture results may not be optimal due to an inadequate volume of blood received in culture bottles   Culture  Setup Time   Final    GRAM POSITIVE COCCI IN CLUSTERS IN BOTH AEROBIC AND ANAEROBIC BOTTLES Organism ID to follow CRITICAL RESULT CALLED TO, READ BACK BY AND VERIFIED WITH: PHARMD LAURA SEAY BY MESSAN H. AT 0244 ON 07/27/2020    Culture (A)  Final    STAPHYLOCOCCUS AUREUS SUSCEPTIBILITIES TO FOLLOW Performed at [facility], [place] [address] [place], [place] [address]    Report Status PENDING  Incomplete  Blood Culture ID Panel (Reflexed)     Status: Abnormal   Collection Time: 07/22/2020  9:50 AM  Result Value Ref Range Status   Enterococcus faecalis NOT DETECTED NOT DETECTED Final   Enterococcus Faecium NOT DETECTED NOT DETECTED Final   Listeria monocytogenes NOT DETECTED NOT DETECTED Final   Staphylococcus species DETECTED (A) NOT DETECTED Final    Comment: CRITICAL RESULT CALLED TO, READ BACK BY AND VERIFIED WITH: PHARMD LAURA SEAY BY MESSAN H. AT 0244 ON 08/13/2020  Staphylococcus aureus (BCID) DETECTED (A) NOT DETECTED Final    Comment: CRITICAL RESULT CALLED TO, READ BACK BY AND VERIFIED WITH: PHARMD LAURA SEAY BY MESSAN H. AT [id number] ON 07/17/2020    Staphylococcus epidermidis NOT DETECTED NOT DETECTED Final   Staphylococcus lugdunensis NOT DETECTED NOT DETECTED Final   Streptococcus species NOT DETECTED NOT DETECTED Final   Streptococcus agalactiae NOT DETECTED NOT DETECTED Final   Streptococcus pneumoniae NOT DETECTED NOT DETECTED Final   Streptococcus pyogenes NOT DETECTED NOT DETECTED Final   A.calcoaceticus-baumannii NOT DETECTED NOT DETECTED Final   Bacteroides fragilis NOT DETECTED NOT DETECTED Final   Enterobacterales NOT DETECTED NOT DETECTED Final   Enterobacter cloacae complex NOT DETECTED  NOT DETECTED Final   Escherichia coli NOT DETECTED NOT DETECTED Final   Klebsiella aerogenes NOT DETECTED NOT DETECTED Final   Klebsiella oxytoca NOT DETECTED NOT DETECTED Final   Klebsiella pneumoniae NOT DETECTED NOT DETECTED Final   Proteus species NOT DETECTED NOT DETECTED Final   Salmonella species NOT DETECTED NOT DETECTED Final   Serratia marcescens NOT DETECTED NOT DETECTED Final   Haemophilus influenzae NOT DETECTED NOT DETECTED Final   Neisseria meningitidis NOT DETECTED NOT DETECTED Final   Pseudomonas aeruginosa NOT DETECTED NOT DETECTED Final   Stenotrophomonas maltophilia NOT DETECTED NOT DETECTED Final   Candida albicans NOT DETECTED NOT DETECTED Final   Candida auris NOT DETECTED NOT DETECTED Final   Candida glabrata NOT DETECTED NOT DETECTED Final   Candida krusei NOT DETECTED NOT DETECTED Final   Candida parapsilosis NOT DETECTED NOT DETECTED Final   Candida tropicalis NOT DETECTED NOT DETECTED Final   Cryptococcus neoformans/gattii NOT DETECTED NOT DETECTED Final   Meth resistant mecA/C and MREJ NOT DETECTED NOT DETECTED Final    Comment: Performed at [facility], [address], [place], [place] [address]  Resp Panel by RT-PCR (Flu A&B, Covid) Nasopharyngeal Swab     Status: None   Collection Time: 08/03/2020 10:31 AM   Specimen: Nasopharyngeal Swab; Nasopharyngeal(NP) swabs in vial transport medium  Result Value Ref Range Status   SARS Coronavirus 2 by RT PCR NEGATIVE NEGATIVE Final    Comment: (NOTE) SARS-CoV-2 target nucleic acids are NOT DETECTED.  The SARS-CoV-2 RNA is generally detectable in upper respiratory specimens during the acute phase of infection. The lowest concentration of SARS-CoV-2 viral copies this assay can detect is 138 copies/mL. A negative result does not preclude SARS-Cov-2 infection and should not be used as the sole basis for treatment or other patient management decisions. A negative result may occur with  improper specimen  collection/handling, submission of specimen other than nasopharyngeal swab, presence of viral mutation(s) within the areas targeted by this assay, and inadequate number of viral copies(<138 copies/mL). A negative result must be combined with clinical observations, patient history, and epidemiological information. The expected result is Negative.  Fact Sheet for Patients:  EntrepreneurPulse.com.au  Fact Sheet for Healthcare Providers:  IncredibleEmployment.be  This test is no t yet approved or cleared by the [place] FDA and  has been authorized for detection and/or diagnosis of SARS-CoV-2 by FDA under an Emergency Use Authorization (EUA). This EUA will remain  in effect (meaning this test can be used) for the duration of the COVID-19 declaration under Section 564(b)(1) of the Act, 21 U.S.C.section 360bbb-3(b)(1), unless the authorization is terminated  or revoked sooner.       Influenza A by PCR NEGATIVE NEGATIVE Final   Influenza B by PCR NEGATIVE NEGATIVE Final    Comment: (  NOTE) The Xpert Xpress SARS-CoV-2/FLU/RSV plus assay is intended as an aid in the diagnosis of influenza from Nasopharyngeal swab specimens and should not be used as a sole basis for treatment. Nasal washings and aspirates are unacceptable for Xpert Xpress SARS-CoV-2/FLU/RSV testing.  Fact Sheet for Patients: EntrepreneurPulse.com.au  Fact Sheet for Healthcare Providers: IncredibleEmployment.be  This test is not yet approved or cleared by the [place] FDA and has been authorized for detection and/or diagnosis of SARS-CoV-2 by FDA under an Emergency Use Authorization (EUA). This EUA will remain in effect (meaning this test can be used) for the duration of the COVID-19 declaration under Section 564(b)(1) of the Act, 21 U.S.C. section 360bbb-3(b)(1), unless the authorization is terminated or revoked.  Performed at [facility], [place] [address], [place], [place] [address]     Impression/Plan:  1. MSSA bacteremia - TTE without signs of endocarditis.  I have ordered repeat blood cultures.  He needs a TEE at some point to rule out endocarditis, if feasible.  With his confusion at this point, will consider at a later time.  Continue with cefazolin  2.  Confusion - more confused this am.  Repeat CT scan ordered with his new confusion to see if there is any worsening.    3.  Palliative care - now DNR and will continue with a full scope of care otherwise.

## 2020-07-20 NOTE — Progress Notes (Signed)
PROGRESS NOTE    Richard Sullivan  [id number] DOB: 1930-05-06 DOA: 08/05/2020 PCP: Tammi Sou, MD    Brief Narrative:  87 year old gentleman with history of severe critical aortic stenosis on conservative management not amenable to surgery, paroxysmal a flutter on Eliquis, hypertension, hyperlipidemia, chronic kidney disease stage IV, anemia of chronic disease, hypothyroidism, gout and depression presented with shortness of breath ,cough and generalized weakness for about 3 days.  Patient reports about 1 week not feeling well, nonproductive cough.  Symptoms worse for last 24 hours.  Son called EMS.  Patient lives at home with his son next door.  In the emergency room, temperature one 101.4.  Heart rate 104.  Respiratory 26.  On room air.  Lactic acid 2.6.  Covid and influenza negative.  Chest x-ray with mild cardiomegaly, pulmonary vascular congestion and interstitial opacities.  He was subsequently found to have AKI, MSSA bacteremia.  Primary source unknown. Skeletal survey in the ER with CT scan of the cervical spine was normal. Head CT showed bilateral hygroma, thought to be chronic subdural.   Assessment & Plan:   Principal Problem:   Acute respiratory failure with hypoxia (HCC) Active Problems:   Hyperlipidemia   Subclinical hypothyroidism   Sepsis (HCC)   Lactic acidosis   Community acquired pneumonia   Pulmonary vascular congestion   Severe aortic stenosis   Atrial flutter (HCC)   Hyponatremia   Acute renal failure superimposed on stage 4 chronic kidney disease (HCC)   History of anemia due to CKD   Primary hypertension   BPH (benign prostatic hyperplasia)   Fall at home, initial encounter   History of gout   Bacteremia due to methicillin susceptible Staphylococcus aureus (MSSA)  Sepsis present on admission secondary MSSA bacteremia, primary source unknown, possible pneumonia.  Acute hypoxemic respiratory failure. Patient adequately resuscitated.  Blood  pressures fairly stable.  Lactic acid normalized. He was given very conservative IV fluids with severe aortic stenosis and pulmonary edema present. Remains on Ancef.  Followed by ID.   Repeat blood cultures today.   2D echocardiogram with severe aortic stenosis , no evidence of endocarditis.   Patient is not stable enough to go for TEE.  Anticipate prolonged antibiotic therapy if clinical improvement.  AKI on chronic kidney disease stage IV: Baseline creatinine reported about 2.  Presented with creatinine more than 3.  Gentle IV fluids and monitoring.  Urine output is adequate.  Renal functions trending down.  Continue monitoring.  History of severe aortic stenosis, a flutter on Eliquis:  Heart rate is fluctuating, back on a flutter.  Heart rate control is acceptable. Head CT changes were thought to be chronic, Eliquis was continued.  With altered mentation, discontinue. Symptomatic treatment for heart rate controlled. His aortic stenosis is critical, this may make treatment complicated.  Seen by palliative. Current plan is to continue treatment with antibiotics.  DNR/DNI. If does not do good clinical recovery, he is appropriate for hospice.  I have recommended this to patient's son at the bedside on 4/3.  Essential hypertension: Chronic and stable.  Discontinue amlodipine with risk of drop in blood pressure.  Metoprolol with parameters.  Altered mental status/hospital-acquired delirium: Patient received Ativan today morning, remains confused and easily distractible today.  Discontinue further doses of benzodiazepine. CT scan on arrival showed hygroma, he continued to receive Eliquis on admission, discontinue further Eliquis. Repeat CT head today to rule out expanding subdural hematoma or intracranial bleeding. Symptomatic treatment.   DVT prophylaxis: SCDs Start: 07/25/2020  36 SCDs Start: 07/28/2020 1407   Code Status: DNR Family Communication: Patient's son on the phone and at bedside  4/3.  Will meet him. Disposition Plan: Status is: Inpatient  Remains inpatient appropriate because:Hemodynamically unstable, IV treatments appropriate due to intensity of illness or inability to take PO and Inpatient level of care appropriate due to severity of illness   Dispo: The patient is from: Home              Anticipated d/c is to: SNF or hospice.              Patient currently is not medically stable to d/c.   Difficult to place patient No         Consultants:   Palliative care  Infectious disease  Procedures:   None  Antimicrobials:  Antibiotics Given (last 72 hours)    Date/Time Action Medication Dose Rate   07/22/2020 1251 New Bag/Given   azithromycin (ZITHROMAX) 500 mg in sodium chloride 0.9 % 250 mL IVPB 500 mg 250 mL/hr   08/10/2020 1252 New Bag/Given   cefTRIAXone (ROCEPHIN) 1 g in sodium chloride 0.9 % 100 mL IVPB 1 g 200 mL/hr   08/13/2020 1622 Given   doxycycline (VIBRA-TABS) tablet 100 mg 100 mg    07/25/2020 2127 Given   doxycycline (VIBRA-TABS) tablet 100 mg 100 mg    07/19/20 1052 New Bag/Given   ceFAZolin (ANCEF) IVPB 2g/100 mL premix 2 g 200 mL/hr   07/19/20 2207 New Bag/Given   ceFAZolin (ANCEF) IVPB 2g/100 mL premix 2 g 200 mL/hr   07/20/20 1159 New Bag/Given   ceFAZolin (ANCEF) IVPB 2g/100 mL premix 2 g 200 mL/hr         Subjective: Patient seen and examined.  He was more delirious today.  Intermittently alert and able to follow simple commands.  Afebrile overnight.  Intermittently tachycardic.  Unable to provide much history. Given Ativan at 4 AM in the morning.  On 2 L oxygen.  No family at bedside.  Objective: Vitals:   07/20/20 0034 07/20/20 0300 07/20/20 0800 07/20/20 1122  BP: (!) 129/56  100/76 135/64  Pulse: 99  91 91  Resp: '20 20 19 20  '$ Temp: 97.9 F (36.6 C)  98.1 F (36.7 C) 97.7 F (36.5 C)  TempSrc: Axillary  Axillary Axillary  SpO2:   97% 96%  Weight:      Height:        Intake/Output Summary (Last 24 hours) at  07/20/2020 1214 Last data filed at 07/20/2020 0851 Gross per 24 hour  Intake 481.14 ml  Output 650 ml  Net -168.86 ml   Filed Weights   07/17/2020 1020 08/08/2020 1256  Weight: 75.1 kg 75.1 kg    Examination:  General exam: Appears uncomfortable, unable to express but looks in mild distress. Chronically sick looking.  Frail and debilitated. Respiratory system: Conducted airway sounds bilateral. Cardiovascular system: S1 & S2 heard, irregularly irregular. Tachycardic.  Loud pansystolic murmur all over the precordium. Gastrointestinal system: Soft.  Nondistended.  Bowel sounds present. Central nervous system: Patient is alert on stimulation, follows simple commands but unable to keep up with conversation.  He is not oriented.  He is easily distractible.  Looks fidgety and shaky at times. Extremities: Symmetric 5 x 5 power.  Moves all extremities but generalized weakness.   Data Reviewed: I have personally reviewed following labs and imaging studies  CBC: Recent Labs  Lab 08/02/2020 0940 08/10/2020 1047 08/05/2020 1339 07/19/20 0203 07/20/20 0924  WBC  12.1*  --   --  12.0* 13.5*  NEUTROABS 11.2*  --   --  11.4* 12.8*  HGB 13.0 12.6* 10.9* 12.6* 13.6  HCT 39.6 37.0* 32.0* 38.1* 40.3  MCV 85.5  --   --  84.3 83.4  PLT 177  --   --  175 [id number]   Basic Metabolic Panel: Recent Labs  Lab 08/11/2020 0940 08/07/2020 1047 07/27/2020 1339 08/02/2020 1517 07/19/20 0203 07/20/20 0924  NA 131* 133* 131* 131* 134* 138  K 4.9 5.0 4.6 4.8 4.3 3.9  CL 106 107  --  107 107 111  CO2 14*  --   --  13* 15* 15*  GLUCOSE 126* 117*  --  102* 117* 136*  BUN 43* 39*  --  43* 44* 53*  CREATININE 3.12* 3.00*  --  3.04* 2.96* 2.50*  CALCIUM 9.2  --   --  8.8* 9.1 9.7  MG  --   --   --   --   --  1.8  PHOS  --   --   --   --   --  3.6   GFR: Estimated Creatinine Clearance: 22.1 mL/min (A) (by C-G formula based on SCr of 2.5 mg/dL (H)). Liver Function Tests: Recent Labs  Lab 07/20/2020 0940  AST 44*  ALT 14   ALKPHOS 69  BILITOT 1.8*  PROT 6.1*  ALBUMIN 2.5*   No results for input(s): LIPASE, AMYLASE in the last 168 hours. No results for input(s): AMMONIA in the last 168 hours. Coagulation Profile: Recent Labs  Lab 08/05/2020 0940  INR 1.6*   Cardiac Enzymes: No results for input(s): CKTOTAL, CKMB, CKMBINDEX, TROPONINI in the last 168 hours. BNP (last 3 results) No results for input(s): PROBNP in the last 8760 hours. HbA1C: No results for input(s): HGBA1C in the last 72 hours. CBG: Recent Labs  Lab 08/05/2020 1654  GLUCAP 91   Lipid Profile: No results for input(s): CHOL, HDL, LDLCALC, TRIG, CHOLHDL, LDLDIRECT in the last 72 hours. Thyroid Function Tests: No results for input(s): TSH, T4TOTAL, FREET4, T3FREE, THYROIDAB in the last 72 hours. Anemia Panel: No results for input(s): VITAMINB12, FOLATE, FERRITIN, TIBC, IRON, RETICCTPCT in the last 72 hours. Sepsis Labs: Recent Labs  Lab 08/05/2020 0940  LATICACIDVEN 2.6*    Recent Results (from the past 240 hour(s))  Blood Culture (routine x 2)     Status: Abnormal (Preliminary result)   Collection Time: 08/01/2020  9:40 AM   Specimen: BLOOD RIGHT ARM  Result Value Ref Range Status   Specimen Description BLOOD RIGHT ARM  Final   Special Requests   Final    BOTTLES DRAWN AEROBIC AND ANAEROBIC Blood Culture adequate volume   Culture  Setup Time   Final    GRAM POSITIVE COCCI IN CLUSTERS IN BOTH AEROBIC AND ANAEROBIC BOTTLES Organism ID to follow CRITICAL VALUE NOTED.  VALUE IS CONSISTENT WITH PREVIOUSLY REPORTED AND CALLED VALUE. Performed at [facility], [place] [address] [place], [place] [address]    Culture STAPHYLOCOCCUS AUREUS (A)  Final   Report Status PENDING  Incomplete  Blood Culture (routine x 2)     Status: Abnormal (Preliminary result)   Collection Time: 08/09/2020  9:50 AM   Specimen: BLOOD RIGHT HAND  Result Value Ref Range Status   Specimen Description BLOOD RIGHT HAND  Final   Special Requests   Final     BOTTLES DRAWN AEROBIC AND ANAEROBIC Blood Culture results may not be optimal due to an  inadequate volume of blood received in culture bottles   Culture  Setup Time   Final    GRAM POSITIVE COCCI IN CLUSTERS IN BOTH AEROBIC AND ANAEROBIC BOTTLES Organism ID to follow CRITICAL RESULT CALLED TO, READ BACK BY AND VERIFIED WITH: PHARMD LAURA SEAY BY MESSAN H. AT 0244 ON 07/21/2020    Culture (A)  Final    STAPHYLOCOCCUS AUREUS SUSCEPTIBILITIES TO FOLLOW Performed at [facility], [place] [address], [place], [place] [address]    Report Status PENDING  Incomplete  Blood Culture ID Panel (Reflexed)     Status: Abnormal   Collection Time: 07/17/2020  9:50 AM  Result Value Ref Range Status   Enterococcus faecalis NOT DETECTED NOT DETECTED Final   Enterococcus Faecium NOT DETECTED NOT DETECTED Final   Listeria monocytogenes NOT DETECTED NOT DETECTED Final   Staphylococcus species DETECTED (A) NOT DETECTED Final    Comment: CRITICAL RESULT CALLED TO, READ BACK BY AND VERIFIED WITH: PHARMD LAURA SEAY BY MESSAN H. AT 0244 ON 07/17/2020    Staphylococcus aureus (BCID) DETECTED (A) NOT DETECTED Final    Comment: CRITICAL RESULT CALLED TO, READ BACK BY AND VERIFIED WITH: PHARMD LAURA SEAY BY MESSAN H. AT 0244 ON 07/19/2020    Staphylococcus epidermidis NOT DETECTED NOT DETECTED Final   Staphylococcus lugdunensis NOT DETECTED NOT DETECTED Final   Streptococcus species NOT DETECTED NOT DETECTED Final   Streptococcus agalactiae NOT DETECTED NOT DETECTED Final   Streptococcus pneumoniae NOT DETECTED NOT DETECTED Final   Streptococcus pyogenes NOT DETECTED NOT DETECTED Final   A.calcoaceticus-baumannii NOT DETECTED NOT DETECTED Final   Bacteroides fragilis NOT DETECTED NOT DETECTED Final   Enterobacterales NOT DETECTED NOT DETECTED Final   Enterobacter cloacae complex NOT DETECTED NOT DETECTED Final   Escherichia coli NOT DETECTED NOT DETECTED Final   Klebsiella aerogenes NOT DETECTED NOT DETECTED Final    Klebsiella oxytoca NOT DETECTED NOT DETECTED Final   Klebsiella pneumoniae NOT DETECTED NOT DETECTED Final   Proteus species NOT DETECTED NOT DETECTED Final   Salmonella species NOT DETECTED NOT DETECTED Final   Serratia marcescens NOT DETECTED NOT DETECTED Final   Haemophilus influenzae NOT DETECTED NOT DETECTED Final   Neisseria meningitidis NOT DETECTED NOT DETECTED Final   Pseudomonas aeruginosa NOT DETECTED NOT DETECTED Final   Stenotrophomonas maltophilia NOT DETECTED NOT DETECTED Final   Candida albicans NOT DETECTED NOT DETECTED Final   Candida auris NOT DETECTED NOT DETECTED Final   Candida glabrata NOT DETECTED NOT DETECTED Final   Candida krusei NOT DETECTED NOT DETECTED Final   Candida parapsilosis NOT DETECTED NOT DETECTED Final   Candida tropicalis NOT DETECTED NOT DETECTED Final   Cryptococcus neoformans/gattii NOT DETECTED NOT DETECTED Final   Meth resistant mecA/C and MREJ NOT DETECTED NOT DETECTED Final    Comment: Performed at [facility], [address] [place], [place] [address]  Resp Panel by RT-PCR (Flu A&B, Covid) Nasopharyngeal Swab     Status: None   Collection Time: 07/17/2020 10:31 AM   Specimen: Nasopharyngeal Swab; Nasopharyngeal(NP) swabs in vial transport medium  Result Value Ref Range Status   SARS Coronavirus 2 by RT PCR NEGATIVE NEGATIVE Final    Comment: (NOTE) SARS-CoV-2 target nucleic acids are NOT DETECTED.  The SARS-CoV-2 RNA is generally detectable in upper respiratory specimens during the acute phase of infection. The lowest concentration of SARS-CoV-2 viral copies this assay can detect is 138 copies/mL. A negative result does not preclude SARS-Cov-2 infection and should not be  used as the sole basis for treatment or other patient management decisions. A negative result may occur with  improper specimen collection/handling, submission of specimen other than nasopharyngeal swab, presence of viral mutation(s) within the areas targeted  by this assay, and inadequate number of viral copies(<138 copies/mL). A negative result must be combined with clinical observations, patient history, and epidemiological information. The expected result is Negative.  Fact Sheet for Patients:  EntrepreneurPulse.com.au  Fact Sheet for Healthcare Providers:  IncredibleEmployment.be  This test is no t yet approved or cleared by the [place] FDA and  has been authorized for detection and/or diagnosis of SARS-CoV-2 by FDA under an Emergency Use Authorization (EUA). This EUA will remain  in effect (meaning this test can be used) for the duration of the COVID-19 declaration under Section 564(b)(1) of the Act, 21 U.S.C.section 360bbb-3(b)(1), unless the authorization is terminated  or revoked sooner.       Influenza A by PCR NEGATIVE NEGATIVE Final   Influenza B by PCR NEGATIVE NEGATIVE Final    Comment: (NOTE) The Xpert Xpress SARS-CoV-2/FLU/RSV plus assay is intended as an aid in the diagnosis of influenza from Nasopharyngeal swab specimens and should not be used as a sole basis for treatment. Nasal washings and aspirates are unacceptable for Xpert Xpress SARS-CoV-2/FLU/RSV testing.  Fact Sheet for Patients: EntrepreneurPulse.com.au  Fact Sheet for Healthcare Providers: IncredibleEmployment.be  This test is not yet approved or cleared by the [place] FDA and has been authorized for detection and/or diagnosis of SARS-CoV-2 by FDA under an Emergency Use Authorization (EUA). This EUA will remain in effect (meaning this test can be used) for the duration of the COVID-19 declaration under Section 564(b)(1) of the Act, 21 U.S.C. section 360bbb-3(b)(1), unless the authorization is terminated or revoked.  Performed at [facility], [place] [address] [place], [place] [address]          Radiology Studies: CT HEAD WO CONTRAST  Result Date:  08/11/2020 CLINICAL DATA:  Fall. EXAM: CT HEAD WITHOUT CONTRAST TECHNIQUE: Contiguous axial images were obtained from the base of the skull through the vertex without intravenous contrast. COMPARISON:  None. FINDINGS: Brain: Bilateral mixed density subdural collections measuring up to approximately 5 mm on the right and 6 mm on the left. There is some amorphous hyperdensity within the left greater than right collection (for example series 5, image 31) which is suggestive of acute/recent hemorrhage. No substantial mass effect due to the patient's underlying cerebral volume loss. No midline shift. Basal cisterns are patent. No evidence of acute intraparenchymal hemorrhage. No evidence of acute large vascular territory infarct. No mass lesion. No hydrocephalus. Patchy white matter hypoattenuation, nonspecific but most likely related to chronic microvascular ischemic disease. Vascular: No hyperdense vessel identified. Calcific atherosclerosis. Skull: No acute fracture. Sinuses/Orbits: Small retention cyst in the inferior left maxillary sinus. Otherwise, no substantial paranasal sinus disease. Unremarkable orbits. Other: No mastoid effusions. 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 Amorphous hyperdensity within the left greater than right collections is suggestive of acute/recent hemorrhage within the collections. No substantial mass effect due to the patient's underlying cerebral volume loss. No midline shift. Electronically Signed   By: Margaretha Sheffield MD   On: 08/10/2020 14:30   US RENAL  Result Date: 07/31/2020 CLINICAL DATA:  Renal failure EXAM: RENAL / URINARY TRACT ULTRASOUND COMPLETE COMPARISON:  June 24, 2016 FINDINGS: Right  Kidney: Renal measurements: 11.3 x 4.8 x 4.4 cm = volume: 124.2 mL. Contains a 1.4 x 1.1 x 1.0 cm mass and a separate 1.5 x 1.4 x 1.1 cm mass.  Neither definitely cystic based on today's imaging in either was seen previously. Increased cortical echogenicity. Left Kidney: Renal measurements: 10.3 x 4.7 x 4.3 cm = volume: 107.6 mL. Contains a 1.6 x 1.2 x 0.9 cm hypoechoic mass. Increased cortical echogenicity. Bladder: A left-sided ureterocele is not excluded. The bladder is otherwise normal. Other: None. IMPRESSION: 1. There are 2 masses in the right kidney and 1 on the left. The largest mass on the right measures 1.5 cm and the largest mass on the left measures 1.6 cm. These masses are not definitively cystic on provided imaging. MRI is recommended for further evaluation. The MRI should be performed in the nonacute setting. 2. Medical renal disease. 3. A left-sided ureterocele is not excluded. Electronically Signed   By: Dorise Bullion III M.D   On: 07/27/2020 15:02   ECHOCARDIOGRAM COMPLETE  Result Date: 07/19/2020    ECHOCARDIOGRAM REPORT   Patient Name:   ZEBASTIAN BEAHAN Cirillo Date of Exam: 07/19/2020 Medical Rec #:  [id number]           Height:       71.0 in Accession #:    [id number]          Weight:       165.6 lb Date of Birth:  [birth year]           BSA:          1.946 m Patient Age:    87 years            BP:           84/49 mmHg Patient Gender: M                   HR:           76 bpm. Exam Location:  Inpatient Procedure: 2D Echo                               MODIFIED REPORT: This report was modified by Cherlynn Kaiser MD on 07/19/2020 due to conclusion.  Indications:     Bacteremia R78.81  History:         Patient has prior history of Echocardiogram examinations, most                  recent 02/11/2019. Arrythmias:Atrial Flutter and Atrial                  Fibrillation, Signs/Symptoms:Murmur and Bacteremia; Risk                  Factors:Dyslipidemia and Hypertension. Acute Renal Failure,                  Sepsis.  Sonographer:     Leavy Cella Referring Phys:  [id number] Barb Merino Diagnosing Phys: Cherlynn Kaiser MD  Sonographer Comments:  Technically difficult study due to poor echo windows. IMPRESSIONS  1. The aortic valve is abnormal. There is severe calcifcation of the aortic valve. Aortic valve regurgitation is trivial. Severe aortic valve stenosis. Aortic valve area, by VTI measures 0.68 cm. Aortic valve mean gradient measures 46.0 mmHg. Aortic valve Vmax measures 4.18 m/s.  2. Left ventricular ejection fraction, by estimation, is 55 to 60%. The left ventricle has normal function. The left  ventricle has no regional wall motion abnormalities. There is severe left ventricular hypertrophy. Left ventricular diastolic parameters  are indeterminate.  3. Right ventricular systolic function is normal. The right ventricular size is normal. There is mildly elevated pulmonary artery systolic pressure. The estimated right ventricular systolic pressure is Q000111Q mmHg.  4. Left atrial size was moderately dilated.  5. Right atrial size was mildly dilated.  6. The mitral valve is degenerative. Mild mitral valve regurgitation. Mild mitral stenosis. Moderate mitral annular calcification.  7. The inferior vena cava is normal in size with greater than 50% respiratory variability, suggesting right atrial pressure of 3 mmHg. Conclusion(s)/Recommendation(s): No evidence of valvular vegetations on this transthoracic echocardiogram. If clinically appropriate, consider TEE. FINDINGS  Left Ventricle: Left ventricular ejection fraction, by estimation, is 55 to 60%. The left ventricle has normal function. The left ventricle has no regional wall motion abnormalities. The left ventricular internal cavity size was normal in size. There is  severe left ventricular hypertrophy. Left ventricular diastolic parameters are indeterminate. Right Ventricle: The right ventricular size is normal. No increase in right ventricular wall thickness. Right ventricular systolic function is normal. There is mildly elevated pulmonary artery systolic pressure. The tricuspid regurgitant velocity is  3.19  m/s, and with an assumed right atrial pressure of 3 mmHg, the estimated right ventricular systolic pressure is Q000111Q mmHg. Left Atrium: Left atrial size was moderately dilated. Right Atrium: Right atrial size was mildly dilated. Pericardium: There is no evidence of pericardial effusion. Mitral Valve: The mitral valve is degenerative in appearance. Moderate mitral annular calcification. Mild mitral valve regurgitation. Mild mitral valve stenosis. The mean mitral valve gradient is 4.5 mmHg with average heart rate of 91 bpm. Tricuspid Valve: The tricuspid valve is normal in structure. Tricuspid valve regurgitation is mild . No evidence of tricuspid stenosis. Aortic Valve: The aortic valve is abnormal. There is severe calcifcation of the aortic valve. Aortic valve regurgitation is trivial. Aortic regurgitation PHT measures 297 msec. Severe aortic stenosis is present. Aortic valve mean gradient measures 46.0 mmHg. Aortic valve peak gradient measures 69.9 mmHg. Aortic valve area, by VTI measures 0.68 cm. Pulmonic Valve: The pulmonic valve was normal in structure. Pulmonic valve regurgitation is not visualized. No evidence of pulmonic stenosis. Aorta: The aortic root is normal in size and structure. Venous: The inferior vena cava is normal in size with greater than 50% respiratory variability, suggesting right atrial pressure of 3 mmHg. IAS/Shunts: No atrial level shunt detected by color flow Doppler.  LEFT VENTRICLE PLAX 2D LVIDd:         2.90 cm  Diastology LVIDs:         2.40 cm  LV e' medial:    6.08 cm/s LV PW:         1.60 cm  LV E/e' medial:  26.6 LV IVS:        1.60 cm  LV e' lateral:   9.00 cm/s LVOT diam:     2.00 cm  LV E/e' lateral: 18.0 LV SV:         53 LV SV Index:   27 LVOT Area:     3.14 cm  RIGHT VENTRICLE RV S prime:     14.60 cm/s LEFT ATRIUM             Index       RIGHT ATRIUM           Index LA diam:        3.60 cm 1.85 cm/m  RA Area:     18.90 cm LA Vol (A2C):   53.0 ml 27.24 ml/m RA  Volume:   58.40 ml  30.01 ml/m LA Vol (A4C):   59.2 ml 30.42 ml/m LA Biplane Vol: 59.1 ml 30.37 ml/m  AORTIC VALVE AV Area (Vmax):    0.56 cm AV Area (Vmean):   0.54 cm AV Area (VTI):     0.68 cm AV Vmax:           418.00 cm/s AV Vmean:          301.333 cm/s AV VTI:            0.779 m AV Peak Grad:      69.9 mmHg AV Mean Grad:      46.0 mmHg LVOT Vmax:         74.15 cm/s LVOT Vmean:        51.831 cm/s LVOT VTI:          0.169 m LVOT/AV VTI ratio: 0.22 AI PHT:            297 msec  AORTA Ao Root diam: 3.30 cm MITRAL VALVE                TRICUSPID VALVE MV Area (PHT): 3.97 cm     TR Peak grad:   40.7 mmHg MV Mean grad:  4.5 mmHg     TR Vmax:        319.00 cm/s MV Decel Time: 191 msec MR Peak grad: 106.5 mmHg    SHUNTS MR Vmax:      516.00 cm/s   Systemic VTI:  0.17 m MV E velocity: 162.00 cm/s  Systemic Diam: 2.00 cm MV A velocity: 77.70 cm/s MV E/A ratio:  2.08 Cherlynn Kaiser MD Electronically signed by Cherlynn Kaiser MD Signature Date/Time: 07/19/2020/4:39:28 PM    Final (Updated)         Scheduled Meds: . amitriptyline  50 mg Oral QHS  . busPIRone  5 mg Oral q1800  . ezetimibe  10 mg Oral Daily  . feeding supplement  237 mL Oral BID BM  . metoprolol succinate  50 mg Oral Daily  . tamsulosin  0.4 mg Oral Daily   Continuous Infusions: .  ceFAZolin (ANCEF) IV 2 g (07/20/20 1159)     LOS: 2 days    Time spent: 34 minutes    Barb Merino, MD [facility] Pager [phone]

## 2020-07-20 NOTE — Progress Notes (Signed)
I was asked to review the patient's CT scan.  He is an elderly male and poor overall health with recently diagnosed bacteremia.  Patient with decreased mental status on exam.  Follow-up  had-CT scan demonstrates continued presence of small bilateral chronic subdural fluid collections which are noncompressive and show no signs of acute hemorrhage within them.  Complicating matters is the patient's need for anticoagulation.  At this point I think the patient has small chronic subdural hematomas without mass effect.  I think that he can reasonably continue on anticoagulation.  Recognizing that if he were to bleed this would be a terminal event and not something we could realistically reverse with any type of surgical intervention.  Overall I think the patient's risk of worsening hemorrhage is extremely small and given his need for anticoagulation with regard to his other medical issues I think that he should be continued.

## 2020-07-20 NOTE — Progress Notes (Signed)
Initial Nutrition Assessment  DOCUMENTATION CODES:   Severe malnutrition in context of chronic illness  INTERVENTION:   Liberalize diet to REGULAR   Ensure Enlive po BID, each supplement provides 350 kcal and 20 grams of protein  Magic cup BID with meals, each supplement provides 290 kcal and 9 grams of protein  MVI daily   NUTRITION DIAGNOSIS:   Severe Malnutrition related to chronic illness (aortic stenosis, CKD IV) as evidenced by severe fat depletion,severe muscle depletion.  GOAL:   Patient will meet greater than or equal to 90% of their needs  MONITOR:   PO intake,Supplement acceptance,Weight trends,Labs,I & O's  REASON FOR ASSESSMENT:   Malnutrition Screening Tool    ASSESSMENT:   Patient with PMH significant for severe aortic stenosis on conservative management, HTN, HLD, CKD IV, gout, and depression. Presents this admission with sepsis secondary to MSSA bacteremia, primary source unknown.   Patient confused and trying to get out of bed upon RD assessment. Unable to obtain nutrition and weight history. Per RN, patient taking a few bites at meal times but gets distracted easily. RD to provide supplements to maximize kcal and protein this admission. If mental status unable to improve may need to consider nutrition support if within [place] as patient is severely malnourished.   Records show declining weight from 80.6 kg on 12/13/19 to 75.1 kg this admission (6.7% wt loss in 8 months, insignificant for time frame).   UOP: 525 ml x 24 hrs   Medications: reviewed  Labs: Cr 2.5- down from yesterday   NUTRITION - FOCUSED PHYSICAL EXAM:  Flowsheet Row Most Recent Value  Orbital Region Severe depletion  Upper Arm Region Severe depletion  Thoracic and Lumbar Region Severe depletion  Buccal Region Severe depletion  Temple Region Severe depletion  Clavicle Bone Region Severe depletion  Clavicle and Acromion Bone Region Severe depletion  Scapular Bone Region Severe  depletion  Dorsal Hand Severe depletion  Patellar Region Severe depletion  Anterior Thigh Region Severe depletion  Posterior Calf Region Severe depletion  Edema (RD Assessment) None  Hair Reviewed  Eyes Unable to assess  Mouth Unable to assess  Skin Reviewed  Nails Reviewed     Diet Order:   Diet Order            Diet Heart Room service appropriate? Yes; Fluid consistency: Thin  Diet effective now                 EDUCATION NEEDS:   Not appropriate for education at this time  Skin:  Skin Assessment: Reviewed RN Assessment  Last BM:  4/2  Height:   Ht Readings from Last 1 Encounters:  07/17/2020 '5\' 11"'$  (1.803 m)    Weight:   Wt Readings from Last 1 Encounters:  07/19/2020 75.1 kg    BMI:  Body mass index is 23.09 kg/m.  Estimated Nutritional Needs:   Kcal:  2250-2450 kcal  Protein:  110-125 grams  Fluid:  >/= 2 L/day  Mariana Single RD, LDN Clinical Nutrition Pager listed in [place]

## 2020-07-21 DIAGNOSIS — B9561 Methicillin susceptible Staphylococcus aureus infection as the cause of diseases classified elsewhere: Secondary | ICD-10-CM

## 2020-07-21 DIAGNOSIS — R410 Disorientation, unspecified: Secondary | ICD-10-CM | POA: Diagnosis not present

## 2020-07-21 DIAGNOSIS — I350 Nonrheumatic aortic (valve) stenosis: Secondary | ICD-10-CM | POA: Diagnosis not present

## 2020-07-21 DIAGNOSIS — J9601 Acute respiratory failure with hypoxia: Secondary | ICD-10-CM | POA: Diagnosis not present

## 2020-07-21 DIAGNOSIS — N179 Acute kidney failure, unspecified: Secondary | ICD-10-CM

## 2020-07-21 DIAGNOSIS — A419 Sepsis, unspecified organism: Secondary | ICD-10-CM | POA: Diagnosis not present

## 2020-07-21 DIAGNOSIS — R7881 Bacteremia: Secondary | ICD-10-CM | POA: Diagnosis not present

## 2020-07-21 DIAGNOSIS — N184 Chronic kidney disease, stage 4 (severe): Secondary | ICD-10-CM

## 2020-07-21 LAB — CULTURE, BLOOD (ROUTINE X 2): Special Requests: ADEQUATE

## 2020-07-21 LAB — BASIC METABOLIC PANEL
Anion gap: 10 (ref 5–15)
BUN: 53 mg/dL — ABNORMAL HIGH (ref 8–23)
CO2: 16 mmol/L — ABNORMAL LOW (ref 22–32)
Calcium: 9.6 mg/dL (ref 8.9–10.3)
Chloride: 113 mmol/L — ABNORMAL HIGH (ref 98–111)
Creatinine, Ser: 2.38 mg/dL — ABNORMAL HIGH (ref 0.61–1.24)
GFR, Estimated: 26 mL/min — ABNORMAL LOW (ref >60)
Glucose, Bld: 101 mg/dL — ABNORMAL HIGH (ref 70–99)
Potassium: 3.7 mmol/L (ref 3.5–5.1)
Sodium: 139 mmol/L (ref 135–145)

## 2020-07-21 MED ORDER — HALOPERIDOL 5 MG PO TABS
5.0000 mg | ORAL_TABLET | Freq: Every evening | ORAL | Status: DC | PRN
  Administered 2020-07-21: 5 mg via ORAL
  Filled 2020-07-21 (×3): qty 1

## 2020-07-21 NOTE — Progress Notes (Signed)
Family communication:  I met with patient's son at the bedside.  He usually works and arrives to the hospital after 4:00. Patient was impulsive and difficult to get any history in the morning, however by later afternoon he is more awake, composed and able to answer simple questions. He was able to swallow dysphagia 1 diet and clear liquid.  Updated patient's son at the bedside who is a still anticipating that he may be able to go to short-term rehab and go home with IV antibiotics with him. He is aware about difficulty recovery but he does not anticipate that his father is at end-of-life.

## 2020-07-21 NOTE — Progress Notes (Signed)
Daily Progress Note   Patient Name: Richard Sullivan       Date: 07/21/2020 DOB: [birth year]  Age: 87 y.o. MRN#: [id number] Attending Physician: Barb Merino, MD Primary Care Physician: Tammi Sou, MD Admit Date: 08/02/2020  Reason for Consultation/Follow-up: Establishing goals of care  Subjective: Patient is awake, alert, able to tell me his name and that he is in [facility] hospital. His limbs are very stiff and tremoring, he resists nursing attempt to reposition his legs. He denies pain. He is not eating.  Noted plan for SLP eval.  Called patient's son Richard Sullivan- he indicates that he believes patient is improving. He states he expects patient will be at hospital until at least next week.  Discussed with [name] patient's current appearance and not eating. I shared with [name] that if patient does not show improved oral intake- I am concerned about his prognosis.    Length of Stay: 3  Current Medications: Scheduled Meds:  . amitriptyline  50 mg Oral QHS  . busPIRone  5 mg Oral q1800  . ezetimibe  10 mg Oral Daily  . feeding supplement  237 mL Oral BID BM  . metoprolol succinate  50 mg Oral Daily  . multivitamin with minerals  1 tablet Oral Daily  . tamsulosin  0.4 mg Oral Daily    Continuous Infusions: .  ceFAZolin (ANCEF) IV 2 g (07/21/20 0955)    PRN Meds: acetaminophen **OR** acetaminophen, guaiFENesin-dextromethorphan, haloperidol, ipratropium-albuterol, ondansetron **OR** ondansetron (ZOFRAN) IV, polyethylene glycol  Physical Exam Vitals and nursing note reviewed.  Constitutional:      Comments: cachetic  Musculoskeletal:     Comments: Upper and lower extremity stiffness  Neurological:     Mental Status: He is alert.     Comments: tremors             Vital Signs: BP  127/66 (BP Location: Left Arm)   Pulse 92   Temp 97.9 F (36.6 C) (Axillary)   Resp 20   Ht '5\' 11"'$  (1.803 m)   Wt 75.1 kg   SpO2 97%   BMI 23.09 kg/m  SpO2: SpO2: 97 % O2 Device: O2 Device: Room Air O2 Flow Rate: O2 Flow Rate (L/min): 2 L/min  Intake/output summary:   Intake/Output Summary (Last 24 hours) at 07/21/2020 1316 Last data filed at 07/21/2020  0404 Gross per 24 hour  Intake --  Output 900 ml  Net -900 ml   LBM: Last BM Date: 08/03/2020 Baseline Weight: Weight: 75.1 kg Most recent weight: Weight: 75.1 kg       Palliative Assessment/Data: PPS: 10%      Patient Active Problem List   Diagnosis Date Noted  . Protein-calorie malnutrition, severe 07/20/2020  . Bacteremia due to methicillin susceptible Staphylococcus aureus (MSSA) 07/19/2020  . Acute respiratory failure with hypoxia ([place]) 08/06/2020  . Sepsis ([place]) 08/12/2020  . Lactic acidosis 07/31/2020  . Community acquired pneumonia 08/09/2020  . Pulmonary vascular congestion 07/19/2020  . Severe aortic stenosis 08/03/2020  . Atrial flutter ([place]) 08/11/2020  . Hyponatremia 07/17/2020  . Acute renal failure superimposed on stage 4 chronic kidney disease ([place]) 07/20/2020  . History of anemia due to CKD 08/03/2020  . Primary hypertension 07/19/2020  . BPH (benign prostatic hyperplasia) 07/19/2020  . Fall at home, initial encounter 08/13/2020  . History of gout 08/10/2020  . Hyperkalemia 08/02/2016  . Abnormal urine finding 03/03/2016  . Atrial fibrillation with rapid ventricular response ([place])   . Bronchitis 07/28/2015  . Chronic bronchitis with acute exacerbation ([place]) 07/28/2015  . Tachyarrhythmia 07/28/2015  . Thrombocytopenia ([place]) 04/29/2015  . Left carotid bruit 06/24/2014  . Chronic kidney disease 06/24/2014  . Subclinical hypothyroidism 06/17/2014  . Preventative health care 12/13/2013  . Chronic leukopenia 12/13/2013  . Microscopic hematuria 08/13/2013  . HTN (hypertension), benign 08/20/2012  .  Hyperlipidemia 08/20/2012  . Chronic kidney disease (CKD), stage III (moderate) ([place]) 08/20/2012  . Heart murmur, systolic [id number]    Palliative Care Assessment & Plan   Patient Profile: Per intake H&P --> [name] a 87 y.o.malewith medical history significant ofsevere aortic stenosis conservatively managed by cardiology, a flutteron Eliquis, HTN, HLD, CKD stage IV, anemia of chronic kidney failure, BPH, chronic thrombocytopenia, hypothyroidism, gout, depression, who presented with shortness of breath, cough and generalized weakness.Workup reveals MSSA bacteremia sepsis, small, chronic subdural bleeding. Palliative medicine consulted for [name].   Assessment/Recommendations/Plan   Patient failing to improve- not eating- discussed with son plan for SLP eval- patient previously expressed he did not want feeding tube- gently discussed with son that if patient is unable to eat an amount compatible with sustaining life- then we may need to revisit future plans  Patient's son will be here this afternoon at 4- he would like to speak with Dr. Sloan Leiter  Goals of Care and Additional Recommendations:  Limitations on Scope of Treatment: No Artificial Feeding  Code Status:  DNR  Prognosis:   Unable to determine  Discharge Planning:  To Be Determined  Care plan was discussed with patient's nurse and son.   Thank you for allowing the Palliative Medicine Team to assist in the care of this patient.   Total time: 42 mins Greater than 50%  of this time was spent counseling and coordinating care related to the above assessment and plan.  Mariana Kaufman, AGNP-C Palliative Medicine   Please contact Palliative Medicine Team phone at [phone] for questions and concerns.

## 2020-07-21 NOTE — Evaluation (Signed)
Clinical/Bedside Swallow Evaluation Patient Details  Name: Richard Sullivan MRN: [id number] Date of Birth: 1930/08/03  Today's Date: 07/21/2020 Time: SLP Start Time (ACUTE ONLY): 1414 SLP Stop Time (ACUTE ONLY): 1438 SLP Time Calculation (min) (ACUTE ONLY): 24 min  Past Medical History:  Past Medical History:  Diagnosis Date  . Anemia of chronic renal failure   . Atrial flutter (HCC)    persistent, on anticoagulation  . BPH (benign prostatic hyperplasia)   . Chronic leukopenia    Neutropenia, monocytosis also with chronic thrombocytopenia--Dr. Marin Olp following; stable as of 03/16/16 f/u.  Marland Kitchen Chronic renal insufficiency, stage 4 (severe) (HCC)    Nephrosclerosis.  +Proteinuria.  GFR stable at 26 ml/min (sCr 2.21) as of 12/2017 neph f/u.  ?nephrotic syndrome in 82s?--no old records. Dr. Jimmy Footman, 2018, renal u/s- medical renal dz/Nephrosclerosis per renal (no RAS).  Cr 2.47/GFR 23 on 02/13/17 neph f/u.  K 5.5.  sCr stable at 2.04, K 4.25 Apr 2018 neph f/u.  Marland Kitchen Colon polyps    Colonoscopy x 3 per pt--initial one with polyp but two 5 yr f/u colonoscopies normal per pt.  . Depression   . Gout    R foot/MTP  . H/O exercise stress test    "normal"  . History of hyperkalemia 07/2016   cutting ACE-I dose in half helped this normalize.  +Recurrence 09/2016 (K 5.7 on neph f/u labs).  Elevated again 01/2017--started pt on scheduled kayexalate.  . Hyperkalemia    Due to CKD stage 4 (improved when ACE-I d/c'd).  . Hyperlipidemia   . Hypertension    takes meds daily  . Leukopenia   . Microhematuria    Renal u/s 08/2013 remarkable only for small nonobstructing stone in each kidney.  Urol feels like this is likely the source of the microhem, cystoscopy likely low yield--following stones with q104moultrasounds and office f/u ([facility])  . Severe aortic stenosis    cardiologist recommended TAVR 09/2015 and again 01/2016--pt declines this procedure b/c he is asymptomatic. Rpt echo 01/2018 stable AV  area and flow gradients (all in severe range). Next echo 01/2019 per cards.  . Subclinical hypothyroidism 06/2014   TSH 5.2, normal T4 and T3.  . Thrombocytopenia ([place]   . UTI (urinary tract infection) 02/2016   MSSA   Past Surgical History:  Past Surgical History:  Procedure Laterality Date  . COLONOSCOPY    . EYE SURGERY     cataract - right  . INGUINAL HERNIA REPAIR  12/27/2011   Procedure: HERNIA REPAIR INGUINAL ADULT;  Surgeon: TOdis Hollingshead MD;  [facility]  Service: General;  Laterality: Right;  . PROSTATE SURGERY     TURP 1980s/90s.--HELPED.  . TONSILLECTOMY    . TRANSTHORACIC ECHOCARDIOGRAM  07/22/14; 09/2015; 02/09/18; 01/2019   EF 55-60%, AS worse than prior echo: mean gradient 44, peak >100.  2017 f/u echo showed progression of AS to severe-to-critical. 01/2018 stable-->LVEF 60-65%, mild LVH, normal wall motion, severe AS, peak gradient 100, mean 63, AV area0.85cm. 01/2019 no change.   HPI:  Pt is an 87y.o. male admitted 07/19/2020 with SOB, cough, generalized weakness, falls. Workup for acute hypoxic respiratory failure due to CHF/fluid overload; potential for atypical PNA. 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 Pt with worsening confusion; repeat head CT 4/4 with minimal enlargement of subdural collections, consistent with minimal additional subdural bleeding, mild mass-effect, no midline shift. PMH includes severe aortic stenosis, aflutter on Eliquis, HTN, CKD IV, anemia, BPH, gout,  depression   Assessment / Plan / Recommendation Clinical Impression  Pt  has generalized deconditioning and RN reports increased AMS today in the setting of little to no sleep for the past two days. Pt responds appropriately to questions and follows commands with Min cues, but does not show good awareness to solid boluses. He has impaired mastication without his dentures, mostly looking like he was sucking on the graham cracker until it  eventually dissolved. Upon oral inspection, pt could not clear the majority of the bolus, even though it had been softened. Additional purees and thin liquids were needed to clear his oral cavity. Recommend adjusting diet to Dys 1 solids and thin liquids with assistance during meals for safety and to encourage intake. Will f/u for potential to advance to more solid consistencies. SLP Visit Diagnosis: Dysphagia, unspecified (R13.10)    Aspiration Risk  Mild aspiration risk    Diet Recommendation Dysphagia 1 (Puree);Thin liquid   Liquid Administration via: Cup;Straw Medication Administration: Crushed with puree Supervision: Staff to assist with self feeding;Full supervision/cueing for compensatory strategies Compensations: Minimize environmental distractions;Slow rate;Small sips/bites;Other (Comment) (check for oral residue) Postural Changes: Seated upright at 90 degrees    Other  Recommendations Oral Care Recommendations: Oral care BID Other Recommendations: Have oral suction available   Follow up Recommendations Skilled Nursing facility      Frequency and Duration min 2x/week  2 weeks       Prognosis Prognosis for Safe Diet Advancement: Good      Swallow Study   General HPI: Pt is an 87 y.o. male admitted 08/07/2020 with SOB, cough, generalized weakness, falls. Workup for acute hypoxic respiratory failure due to CHF/fluid overload; potential for atypical PNA. 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 Pt with worsening confusion; repeat head CT 4/4 with minimal enlargement of subdural collections, consistent with minimal additional subdural bleeding, mild mass-effect, no midline shift. PMH includes severe aortic stenosis, aflutter on Eliquis, HTN, CKD IV, anemia, BPH, gout, depression Type of Study: Bedside Swallow Evaluation Previous Swallow Assessment: none in chart Diet Prior to this Study: Regular;Thin liquids Temperature Spikes Noted:  No Respiratory Status: Room air History of Recent Intubation: No Behavior/Cognition: Alert;Cooperative;Pleasant mood;Requires cueing Oral Cavity Assessment: Dry Oral Care Completed by SLP: No Oral Cavity - Dentition: Edentulous;Dentures, not available Self-Feeding Abilities: Needs assist Patient Positioning: Upright in bed Baseline Vocal Quality: Low vocal intensity Volitional Swallow: Unable to elicit    Oral/Motor/Sensory Function Overall Oral Motor/Sensory Function: Generalized oral weakness   Ice Chips Ice chips: Within functional limits Presentation: Spoon   Thin Liquid Thin Liquid: Within functional limits Presentation: Cup;Straw    Nectar Thick Nectar Thick Liquid: Not tested   Honey Thick Honey Thick Liquid: Not tested   Puree Puree: Within functional limits Presentation: Spoon   Solid     Solid: Impaired Oral Phase Impairments: Impaired mastication Oral Phase Functional Implications: Oral residue      Osie Bond., M.A. [place] Pager [phone] Office [phone]  07/21/2020,3:21 PM

## 2020-07-21 NOTE — Progress Notes (Addendum)
PROGRESS NOTE    Richard Sullivan  [id number] DOB: 1930/10/24 DOA: 07/20/2020 PCP: Tammi Sou, MD    Brief Narrative:  87 year old gentleman with history of severe critical aortic stenosis on conservative management not amenable to surgery, paroxysmal a flutter on Eliquis, hypertension, hyperlipidemia, chronic kidney disease stage IV, anemia of chronic disease, hypothyroidism, gout and depression presented with shortness of breath ,cough and generalized weakness for about 3 days.  Patient reports about 1 week not feeling well, nonproductive cough.  Symptoms worse for last 24 hours.  Son called EMS.  Patient lives at home with his son next door.  In the emergency room, temperature one 101.4.  Heart rate 104.  Respiratory 26.  On room air.  Lactic acid 2.6.  Covid and influenza negative.  Chest x-ray with mild cardiomegaly, pulmonary vascular congestion and interstitial opacities.  He was subsequently found to have AKI, MSSA bacteremia.  Primary source unknown. Skeletal survey in the ER with CT scan of the cervical spine was normal. Head CT showed bilateral hygroma, thought to be chronic subdural.  Patient has flat affect and difficult to assess mental status at many times.  Remains in the hospital with MSSA bacteremia, poor appetite and on active treatment.  Has episodes of confusion, however easily reoriented.   Assessment & Plan:   Principal Problem:   Acute respiratory failure with hypoxia (HCC) Active Problems:   Hyperlipidemia   Subclinical hypothyroidism   Sepsis (HCC)   Lactic acidosis   Community acquired pneumonia   Pulmonary vascular congestion   Severe aortic stenosis   Atrial flutter (HCC)   Hyponatremia   Acute renal failure superimposed on stage 4 chronic kidney disease (HCC)   History of anemia due to CKD   Primary hypertension   BPH (benign prostatic hyperplasia)   Fall at home, initial encounter   History of gout   Bacteremia due to methicillin  susceptible Staphylococcus aureus (MSSA)   Protein-calorie malnutrition, severe  Severe Sepsis present on admission secondary MSSA bacteremia, primary source unknown, possible pneumonia.  Acute hypoxemic respiratory failure. Patient adequately resuscitated.  Blood pressures fairly stable.  Lactic acid normalized. He was given very conservative IV fluids with severe aortic stenosis and pulmonary edema present. Remains on Ancef.  Followed by ID.   Blood cultures 4/2, 4/4 MSSA Blood cultures 4/4, no growth so far. 2D echocardiogram with severe aortic stenosis , no evidence of endocarditis.   Patient is not stable enough to go for TEE.  Anticipate prolonged antibiotic therapy if clinical improvement.  AKI on chronic kidney disease stage IV: Baseline creatinine reported about 2.  Presented with creatinine more than 3.  Gentle IV fluids and monitoring.  Urine output is adequate.  Renal functions trending down.  Continue monitoring.  History of severe aortic stenosis, a flutter on Eliquis:  Heart rate is fluctuating, back on a-flutter.  Heart rate control is acceptable. With altered mentation, repeat head CT was done that shows stable bilateral subdural hematoma thought to be chronic.  Seen by neurosurgery. His aortic stenosis is critical, this may make treatment complicated.  Seen by palliative. Current plan is to continue treatment with antibiotics.  DNR/DNI. If does not do good clinical recovery, he is appropriate for hospice.   Essential hypertension: Chronic and stable.  Discontinue amlodipine with risk of drop in blood pressure.  Metoprolol with parameters.  Altered mental status/hospital-acquired delirium: He has flat affect makes assessment difficult at times.  Has underlying depression and takes amitriptyline for that. Had disorientation  with Ativan, discontinued. Repeat head CT stable. Symptomatic treatment, will give a small dose of haloperidol at night to help with sleep and relaxation  and nighttime restlessness.  Bilateral subdural hematoma/hygroma: Discussed with neurosurgery.  They thought this is chronic. If absolutely needed, neurosurgery is okay with continuing Eliquis.  Patient with poor recovery and unknown outcome, will discontinue Eliquis to decrease incidence of bleeding.  Goal of care: DNR/DNI.  Patient son anticipates that he can go home after short-term rehab stay.  He wants to decreased at home much as possible.  He has all DME available at home after his mother's death. He has also done PICC line and IV antibiotics at home. Patient now with poor appetite, difficulty swallowing.  Will have speech evaluation.  If he is unable to swallow, will request palliative care to come back evaluate him.   DVT prophylaxis: SCDs Start: 08/09/2020 1412 SCDs Start: 07/30/2020 1407   Code Status: DNR Family Communication: Patient's son at the bedside 4/4.  Will call. Disposition Plan: Status is: Inpatient  Remains inpatient appropriate because:Hemodynamically unstable, IV treatments appropriate due to intensity of illness or inability to take PO and Inpatient level of care appropriate due to severity of illness   Dispo: The patient is from: Home              Anticipated d/c is to: SNF or hospice.              Patient currently is not medically stable to d/c.   Difficult to place patient No         Consultants:   Palliative care  Infectious disease  Procedures:   None  Antimicrobials:  Antibiotics Given (last 72 hours)    Date/Time Action Medication Dose Rate   08/12/2020 1251 New Bag/Given   azithromycin (ZITHROMAX) 500 mg in sodium chloride 0.9 % 250 mL IVPB 500 mg 250 mL/hr   08/03/2020 1252 New Bag/Given   cefTRIAXone (ROCEPHIN) 1 g in sodium chloride 0.9 % 100 mL IVPB 1 g 200 mL/hr   08/03/2020 1622 Given   doxycycline (VIBRA-TABS) tablet 100 mg 100 mg    08/04/2020 2127 Given   doxycycline (VIBRA-TABS) tablet 100 mg 100 mg    07/19/20 1052 New Bag/Given    ceFAZolin (ANCEF) IVPB 2g/100 mL premix 2 g 200 mL/hr   07/19/20 2207 New Bag/Given   ceFAZolin (ANCEF) IVPB 2g/100 mL premix 2 g 200 mL/hr   07/20/20 1159 New Bag/Given   ceFAZolin (ANCEF) IVPB 2g/100 mL premix 2 g 200 mL/hr   07/20/20 2331 New Bag/Given   ceFAZolin (ANCEF) IVPB 2g/100 mL premix 2 g 200 mL/hr   07/21/20 0955 New Bag/Given   ceFAZolin (ANCEF) IVPB 2g/100 mL premix 2 g 200 mL/hr         Subjective: Patient seen and examined.  No overnight events.  He did have episodes of confusion but was able to reorient.  Has flat affect.  Answers simple questions but looks at the ceiling.  Afebrile overnight.  Looks mild discomfort but tells he is fine.  Objective: Vitals:   07/20/20 2353 07/21/20 0406 07/21/20 0743 07/21/20 1114  BP: 139/70 (!) 132/58 125/68 127/66  Pulse: 69 81 97 92  Resp: '18 20 20 20  '$ Temp: (!) 97.5 F (36.4 C) 97.7 F (36.5 C) 98.2 F (36.8 C) 97.9 F (36.6 C)  TempSrc: Oral Oral Oral Axillary  SpO2: 92% 96% 97% 97%  Weight:      Height:  Intake/Output Summary (Last 24 hours) at 07/21/2020 1129 Last data filed at 07/21/2020 0404 Gross per 24 hour  Intake --  Output 900 ml  Net -900 ml   Filed Weights   08/08/2020 1020 07/19/2020 1256  Weight: 75.1 kg 75.1 kg    Examination:  General exam: Appears uncomfortable, unable to express but appears in mild distress. Chronically sick looking.  Frail and debilitated. Respiratory system: Conducted airway sounds bilateral. Cardiovascular system: S1 & S2 heard, irregularly irregular. Tachycardic.  Loud pansystolic murmur all over the precordium. Gastrointestinal system: Soft.  Nondistended.  Bowel sounds present. Central nervous system: Patient is alert on stimulation, follows simple commands but unable to keep up with conversation.  Looks fidgety and shaky at times. Extremities: Symmetric 5 x 5 power.  Moves all extremities but generalized weakness.   Data Reviewed: I have personally reviewed  following labs and imaging studies  CBC: Recent Labs  Lab 08/15/2020 0940 08/10/2020 1047 08/10/2020 1339 07/19/20 0203 07/20/20 0924  WBC 12.1*  --   --  12.0* 13.5*  NEUTROABS 11.2*  --   --  11.4* 12.8*  HGB 13.0 12.6* 10.9* 12.6* 13.6  HCT 39.6 37.0* 32.0* 38.1* 40.3  MCV 85.5  --   --  84.3 83.4  PLT 177  --   --  175 [id number]   Basic Metabolic Panel: Recent Labs  Lab 07/26/2020 0940 08/05/2020 1047 07/29/2020 1339 07/26/2020 1517 07/19/20 0203 07/20/20 0924 07/21/20 0306  NA 131* 133* 131* 131* 134* 138 139  K 4.9 5.0 4.6 4.8 4.3 3.9 3.7  CL 106 107  --  107 107 111 113*  CO2 14*  --   --  13* 15* 15* 16*  GLUCOSE 126* 117*  --  102* 117* 136* 101*  BUN 43* 39*  --  43* 44* 53* 53*  CREATININE 3.12* 3.00*  --  3.04* 2.96* 2.50* 2.38*  CALCIUM 9.2  --   --  8.8* 9.1 9.7 9.6  MG  --   --   --   --   --  1.8  --   PHOS  --   --   --   --   --  3.6  --    GFR: Estimated Creatinine Clearance: 23.2 mL/min (A) (by C-G formula based on SCr of 2.38 mg/dL (H)). Liver Function Tests: Recent Labs  Lab 08/10/2020 0940  AST 44*  ALT 14  ALKPHOS 69  BILITOT 1.8*  PROT 6.1*  ALBUMIN 2.5*   No results for input(s): LIPASE, AMYLASE in the last 168 hours. No results for input(s): AMMONIA in the last 168 hours. Coagulation Profile: Recent Labs  Lab 07/19/2020 0940  INR 1.6*   Cardiac Enzymes: No results for input(s): CKTOTAL, CKMB, CKMBINDEX, TROPONINI in the last 168 hours. BNP (last 3 results) No results for input(s): PROBNP in the last 8760 hours. HbA1C: No results for input(s): HGBA1C in the last 72 hours. CBG: Recent Labs  Lab 07/23/2020 1654  GLUCAP 91   Lipid Profile: No results for input(s): CHOL, HDL, LDLCALC, TRIG, CHOLHDL, LDLDIRECT in the last 72 hours. Thyroid Function Tests: No results for input(s): TSH, T4TOTAL, FREET4, T3FREE, THYROIDAB in the last 72 hours. Anemia Panel: No results for input(s): VITAMINB12, FOLATE, FERRITIN, TIBC, IRON, RETICCTPCT in the last 72  hours. Sepsis Labs: Recent Labs  Lab 07/27/2020 0940  LATICACIDVEN 2.6*    Recent Results (from the past 240 hour(s))  Blood Culture (routine x 2)     Status: Abnormal  Collection Time: 08/10/2020  9:40 AM   Specimen: BLOOD RIGHT ARM  Result Value Ref Range Status   Specimen Description BLOOD RIGHT ARM  Final   Special Requests   Final    BOTTLES DRAWN AEROBIC AND ANAEROBIC Blood Culture adequate volume   Culture  Setup Time   Final    GRAM POSITIVE COCCI IN CLUSTERS IN BOTH AEROBIC AND ANAEROBIC BOTTLES Organism ID to follow CRITICAL VALUE NOTED.  VALUE IS CONSISTENT WITH PREVIOUSLY REPORTED AND CALLED VALUE.    Culture (A)  Final    STAPHYLOCOCCUS AUREUS SUSCEPTIBILITIES PERFORMED ON PREVIOUS CULTURE WITHIN THE LAST 5 DAYS. Performed at [facility], [place] [address], [place], [place] [address]    Report Status 07/21/2020 FINAL  Final  Blood Culture (routine x 2)     Status: Abnormal   Collection Time: 07/28/2020  9:50 AM   Specimen: BLOOD RIGHT HAND  Result Value Ref Range Status   Specimen Description BLOOD RIGHT HAND  Final   Special Requests   Final    BOTTLES DRAWN AEROBIC AND ANAEROBIC Blood Culture results may not be optimal due to an inadequate volume of blood received in culture bottles   Culture  Setup Time   Final    GRAM POSITIVE COCCI IN CLUSTERS IN BOTH AEROBIC AND ANAEROBIC BOTTLES Organism ID to follow CRITICAL RESULT CALLED TO, READ BACK BY AND VERIFIED WITH: PHARMD LAURA SEAY BY MESSAN H. AT [id number] ON 08/08/2020 Performed at [facility], [place] [address] [place], [place] [address]    Culture STAPHYLOCOCCUS AUREUS (A)  Final   Report Status 07/21/2020 FINAL  Final   Organism ID, Bacteria STAPHYLOCOCCUS AUREUS  Final      Susceptibility   Staphylococcus aureus - MIC*    CIPROFLOXACIN <=0.5 SENSITIVE Sensitive     ERYTHROMYCIN <=0.25 SENSITIVE Sensitive     GENTAMICIN <=0.5 SENSITIVE Sensitive     OXACILLIN <=0.25 SENSITIVE Sensitive      TETRACYCLINE <=1 SENSITIVE Sensitive     VANCOMYCIN <=0.5 SENSITIVE Sensitive     TRIMETH/SULFA <=10 SENSITIVE Sensitive     CLINDAMYCIN <=0.25 SENSITIVE Sensitive     RIFAMPIN <=0.5 SENSITIVE Sensitive     Inducible Clindamycin NEGATIVE Sensitive     * STAPHYLOCOCCUS AUREUS  Blood Culture ID Panel (Reflexed)     Status: Abnormal   Collection Time: 08/05/2020  9:50 AM  Result Value Ref Range Status   Enterococcus faecalis NOT DETECTED NOT DETECTED Final   Enterococcus Faecium NOT DETECTED NOT DETECTED Final   Listeria monocytogenes NOT DETECTED NOT DETECTED Final   Staphylococcus species DETECTED (A) NOT DETECTED Final    Comment: CRITICAL RESULT CALLED TO, READ BACK BY AND VERIFIED WITH: PHARMD LAURA SEAY BY MESSAN H. AT 0244 ON 07/30/2020    Staphylococcus aureus (BCID) DETECTED (A) NOT DETECTED Final    Comment: CRITICAL RESULT CALLED TO, READ BACK BY AND VERIFIED WITH: PHARMD LAURA SEAY BY MESSAN H. AT 0244 ON 07/22/2020    Staphylococcus epidermidis NOT DETECTED NOT DETECTED Final   Staphylococcus lugdunensis NOT DETECTED NOT DETECTED Final   Streptococcus species NOT DETECTED NOT DETECTED Final   Streptococcus agalactiae NOT DETECTED NOT DETECTED Final   Streptococcus pneumoniae NOT DETECTED NOT DETECTED Final   Streptococcus pyogenes NOT DETECTED NOT DETECTED Final   A.calcoaceticus-baumannii NOT DETECTED NOT DETECTED Final   Bacteroides fragilis NOT DETECTED NOT DETECTED Final   Enterobacterales NOT DETECTED NOT DETECTED Final   Enterobacter cloacae complex NOT DETECTED NOT DETECTED Final  Escherichia coli NOT DETECTED NOT DETECTED Final   Klebsiella aerogenes NOT DETECTED NOT DETECTED Final   Klebsiella oxytoca NOT DETECTED NOT DETECTED Final   Klebsiella pneumoniae NOT DETECTED NOT DETECTED Final   Proteus species NOT DETECTED NOT DETECTED Final   Salmonella species NOT DETECTED NOT DETECTED Final   Serratia marcescens NOT DETECTED NOT DETECTED Final   Haemophilus  influenzae NOT DETECTED NOT DETECTED Final   Neisseria meningitidis NOT DETECTED NOT DETECTED Final   Pseudomonas aeruginosa NOT DETECTED NOT DETECTED Final   Stenotrophomonas maltophilia NOT DETECTED NOT DETECTED Final   Candida albicans NOT DETECTED NOT DETECTED Final   Candida auris NOT DETECTED NOT DETECTED Final   Candida glabrata NOT DETECTED NOT DETECTED Final   Candida krusei NOT DETECTED NOT DETECTED Final   Candida parapsilosis NOT DETECTED NOT DETECTED Final   Candida tropicalis NOT DETECTED NOT DETECTED Final   Cryptococcus neoformans/gattii NOT DETECTED NOT DETECTED Final   Meth resistant mecA/C and MREJ NOT DETECTED NOT DETECTED Final    Comment: Performed at [facility], [place] [address], [place], [place] [address]  Resp Panel by RT-PCR (Flu A&B, Covid) Nasopharyngeal Swab     Status: None   Collection Time: 08/07/2020 10:31 AM   Specimen: Nasopharyngeal Swab; Nasopharyngeal(NP) swabs in vial transport medium  Result Value Ref Range Status   SARS Coronavirus 2 by RT PCR NEGATIVE NEGATIVE Final    Comment: (NOTE) SARS-CoV-2 target nucleic acids are NOT DETECTED.  The SARS-CoV-2 RNA is generally detectable in upper respiratory specimens during the acute phase of infection. The lowest concentration of SARS-CoV-2 viral copies this assay can detect is 138 copies/mL. A negative result does not preclude SARS-Cov-2 infection and should not be used as the sole basis for treatment or other patient management decisions. A negative result may occur with  improper specimen collection/handling, submission of specimen other than nasopharyngeal swab, presence of viral mutation(s) within the areas targeted by this assay, and inadequate number of viral copies(<138 copies/mL). A negative result must be combined with clinical observations, patient history, and epidemiological information. The expected result is Negative.  Fact Sheet for Patients:   EntrepreneurPulse.com.au  Fact Sheet for Healthcare Providers:  IncredibleEmployment.be  This test is no t yet approved or cleared by the [place] FDA and  has been authorized for detection and/or diagnosis of SARS-CoV-2 by FDA under an Emergency Use Authorization (EUA). This EUA will remain  in effect (meaning this test can be used) for the duration of the COVID-19 declaration under Section 564(b)(1) of the Act, 21 U.S.C.section 360bbb-3(b)(1), unless the authorization is terminated  or revoked sooner.       Influenza A by PCR NEGATIVE NEGATIVE Final   Influenza B by PCR NEGATIVE NEGATIVE Final    Comment: (NOTE) The Xpert Xpress SARS-CoV-2/FLU/RSV plus assay is intended as an aid in the diagnosis of influenza from Nasopharyngeal swab specimens and should not be used as a sole basis for treatment. Nasal washings and aspirates are unacceptable for Xpert Xpress SARS-CoV-2/FLU/RSV testing.  Fact Sheet for Patients: EntrepreneurPulse.com.au  Fact Sheet for Healthcare Providers: IncredibleEmployment.be  This test is not yet approved or cleared by the [place] FDA and has been authorized for detection and/or diagnosis of SARS-CoV-2 by FDA under an Emergency Use Authorization (EUA). This EUA will remain in effect (meaning this test can be used) for the duration of the COVID-19 declaration under Section 564(b)(1) of the Act, 21 U.S.C. section 360bbb-3(b)(1), unless the authorization is terminated or revoked.  Performed at [facility], [place] [address] [place], [place] [address]   Culture, blood (routine x 2)     Status: None (Preliminary result)   Collection Time: 07/20/20  9:22 AM   Specimen: BLOOD  Result Value Ref Range Status   Specimen Description BLOOD LEFT ANTECUBITAL  Final   Special Requests   Final    BOTTLES DRAWN AEROBIC AND ANAEROBIC Blood Culture results may not be optimal due  to an inadequate volume of blood received in culture bottles   Culture   Final    NO GROWTH < 24 HOURS Performed at [facility], [place] [address], [place], [place] [address]    Report Status PENDING  Incomplete  Culture, blood (routine x 2)     Status: None (Preliminary result)   Collection Time: 07/20/20  9:24 AM   Specimen: BLOOD  Result Value Ref Range Status   Specimen Description BLOOD RIGHT ANTECUBITAL  Final   Special Requests   Final    BOTTLES DRAWN AEROBIC AND ANAEROBIC Blood Culture results may not be optimal due to an inadequate volume of blood received in culture bottles   Culture   Final    NO GROWTH < 24 HOURS Performed at [facility], [place] [address] [place], [place] [address]    Report Status PENDING  Incomplete         Radiology Studies: CT HEAD WO CONTRAST  Result Date: 07/20/2020 CLINICAL DATA:  Mental status changes.  Question subdural hematoma. EXAM: CT HEAD WITHOUT CONTRAST TECHNIQUE: Contiguous axial images were obtained from the base of the skull through the vertex without intravenous contrast. COMPARISON:  07/25/2020 FINDINGS: Brain: Bilateral convexity subdural collections, maximal thickness 7 mm on the right and 5 mm on the left show very minimal enlargement with slightly more indistinct hyperdensity consistent with minimal additional subdural bleeding. Mild mass-effect upon the surface of the brain but no right-to-left shift. Cerebral hemispheres show age related atrophy with mild chronic small-vessel change of white matter. No evidence recent infarction. Vascular: There is atherosclerotic calcification of the major vessels at the base of the brain. Skull: No skull fracture. Sinuses/Orbits: Clear/normal Other: None IMPRESSION: Bilateral convexity subdural collections, maximal thickness 7 mm on the right and 5 mm on the left show very minimal enlargement with slightly more indistinct hyperdensity consistent with minimal additional subdural bleeding.  Mild mass-effect upon the surface of the brain but no right-to-left shift. Electronically Signed   By: Nelson Chimes M.D.   On: 07/20/2020 14:00   ECHOCARDIOGRAM COMPLETE  Result Date: 07/19/2020    ECHOCARDIOGRAM REPORT   Patient Name:   TAO OGONOWSKI Hegler Date of Exam: 07/19/2020 Medical Rec #:  [id number]           Height:       71.0 in Accession #:    [id number]          Weight:       165.6 lb Date of Birth:  1930-08-20           BSA:          1.946 m Patient Age:    5 years            BP:           84/49 mmHg Patient Gender: M                   HR:           76 bpm.  Exam Location:  Inpatient Procedure: 2D Echo                               MODIFIED REPORT: This report was modified by Cherlynn Kaiser MD on 07/19/2020 due to conclusion.  Indications:     Bacteremia R78.81  History:         Patient has prior history of Echocardiogram examinations, most                  recent 02/11/2019. Arrythmias:Atrial Flutter and Atrial                  Fibrillation, Signs/Symptoms:Murmur and Bacteremia; Risk                  Factors:Dyslipidemia and Hypertension. Acute Renal Failure,                  Sepsis.  Sonographer:     Leavy Cella Referring Phys:  [id number] Barb Merino Diagnosing Phys: Cherlynn Kaiser MD  Sonographer Comments: Technically difficult study due to poor echo windows. IMPRESSIONS  1. The aortic valve is abnormal. There is severe calcifcation of the aortic valve. Aortic valve regurgitation is trivial. Severe aortic valve stenosis. Aortic valve area, by VTI measures 0.68 cm. Aortic valve mean gradient measures 46.0 mmHg. Aortic valve Vmax measures 4.18 m/s.  2. Left ventricular ejection fraction, by estimation, is 55 to 60%. The left ventricle has normal function. The left ventricle has no regional wall motion abnormalities. There is severe left ventricular hypertrophy. Left ventricular diastolic parameters  are indeterminate.  3. Right ventricular systolic function is normal. The right ventricular size  is normal. There is mildly elevated pulmonary artery systolic pressure. The estimated right ventricular systolic pressure is Q000111Q mmHg.  4. Left atrial size was moderately dilated.  5. Right atrial size was mildly dilated.  6. The mitral valve is degenerative. Mild mitral valve regurgitation. Mild mitral stenosis. Moderate mitral annular calcification.  7. The inferior vena cava is normal in size with greater than 50% respiratory variability, suggesting right atrial pressure of 3 mmHg. Conclusion(s)/Recommendation(s): No evidence of valvular vegetations on this transthoracic echocardiogram. If clinically appropriate, consider TEE. FINDINGS  Left Ventricle: Left ventricular ejection fraction, by estimation, is 55 to 60%. The left ventricle has normal function. The left ventricle has no regional wall motion abnormalities. The left ventricular internal cavity size was normal in size. There is  severe left ventricular hypertrophy. Left ventricular diastolic parameters are indeterminate. Right Ventricle: The right ventricular size is normal. No increase in right ventricular wall thickness. Right ventricular systolic function is normal. There is mildly elevated pulmonary artery systolic pressure. The tricuspid regurgitant velocity is 3.19  m/s, and with an assumed right atrial pressure of 3 mmHg, the estimated right ventricular systolic pressure is Q000111Q mmHg. Left Atrium: Left atrial size was moderately dilated. Right Atrium: Right atrial size was mildly dilated. Pericardium: There is no evidence of pericardial effusion. Mitral Valve: The mitral valve is degenerative in appearance. Moderate mitral annular calcification. Mild mitral valve regurgitation. Mild mitral valve stenosis. The mean mitral valve gradient is 4.5 mmHg with average heart rate of 91 bpm. Tricuspid Valve: The tricuspid valve is normal in structure. Tricuspid valve regurgitation is mild . No evidence of tricuspid stenosis. Aortic Valve: The aortic valve  is abnormal. There is severe calcifcation of the aortic valve. Aortic valve regurgitation is trivial. Aortic regurgitation PHT measures 297 msec. Severe  aortic stenosis is present. Aortic valve mean gradient measures 46.0 mmHg. Aortic valve peak gradient measures 69.9 mmHg. Aortic valve area, by VTI measures 0.68 cm. Pulmonic Valve: The pulmonic valve was normal in structure. Pulmonic valve regurgitation is not visualized. No evidence of pulmonic stenosis. Aorta: The aortic root is normal in size and structure. Venous: The inferior vena cava is normal in size with greater than 50% respiratory variability, suggesting right atrial pressure of 3 mmHg. IAS/Shunts: No atrial level shunt detected by color flow Doppler.  LEFT VENTRICLE PLAX 2D LVIDd:         2.90 cm  Diastology LVIDs:         2.40 cm  LV e' medial:    6.08 cm/s LV PW:         1.60 cm  LV E/e' medial:  26.6 LV IVS:        1.60 cm  LV e' lateral:   9.00 cm/s LVOT diam:     2.00 cm  LV E/e' lateral: 18.0 LV SV:         53 LV SV Index:   27 LVOT Area:     3.14 cm  RIGHT VENTRICLE RV S prime:     14.60 cm/s LEFT ATRIUM             Index       RIGHT ATRIUM           Index LA diam:        3.60 cm 1.85 cm/m  RA Area:     18.90 cm LA Vol (A2C):   53.0 ml 27.24 ml/m RA Volume:   58.40 ml  30.01 ml/m LA Vol (A4C):   59.2 ml 30.42 ml/m LA Biplane Vol: 59.1 ml 30.37 ml/m  AORTIC VALVE AV Area (Vmax):    0.56 cm AV Area (Vmean):   0.54 cm AV Area (VTI):     0.68 cm AV Vmax:           418.00 cm/s AV Vmean:          301.333 cm/s AV VTI:            0.779 m AV Peak Grad:      69.9 mmHg AV Mean Grad:      46.0 mmHg LVOT Vmax:         74.15 cm/s LVOT Vmean:        51.831 cm/s LVOT VTI:          0.169 m LVOT/AV VTI ratio: 0.22 AI PHT:            297 msec  AORTA Ao Root diam: 3.30 cm MITRAL VALVE                TRICUSPID VALVE MV Area (PHT): 3.97 cm     TR Peak grad:   40.7 mmHg MV Mean grad:  4.5 mmHg     TR Vmax:        319.00 cm/s MV Decel Time: 191 msec MR Peak  grad: 106.5 mmHg    SHUNTS MR Vmax:      516.00 cm/s   Systemic VTI:  0.17 m MV E velocity: 162.00 cm/s  Systemic Diam: 2.00 cm MV A velocity: 77.70 cm/s MV E/A ratio:  2.08 Cherlynn Kaiser MD Electronically signed by Cherlynn Kaiser MD Signature Date/Time: 07/19/2020/4:39:28 PM    Final (Updated)         Scheduled Meds: . amitriptyline  50 mg Oral QHS  . busPIRone  5 mg Oral q1800  . ezetimibe  10 mg Oral Daily  . feeding supplement  237 mL Oral BID BM  . metoprolol succinate  50 mg Oral Daily  . multivitamin with minerals  1 tablet Oral Daily  . tamsulosin  0.4 mg Oral Daily   Continuous Infusions: .  ceFAZolin (ANCEF) IV 2 g (07/21/20 0955)     LOS: 3 days    Time spent: 34 minutes    Barb Merino, MD [facility] Pager [phone]

## 2020-07-21 NOTE — Progress Notes (Signed)
Patient confused through the night, alert to self & place, patient continued to removed tele leads, pulse ox, condom cath, and bed linen.  Would have both of his legs over the bed rails, patient would follow commands, patient advised that he just could not make himself stop moving or taking things off.  Patient did not sleep at all through out the night

## 2020-07-21 NOTE — Progress Notes (Signed)
[facility] for Infectious Disease   Reason for visit: Follow up on bacteremia  Interval History: no fever; poor sleep overnight; does not answer questions this am Day 4 total antibiotics Day 3 cefazolin Blood cultures 07/25/2020 4/4 MRSA Blood cultures 07/20/20 4/4 bottles ngtd  Physical Exam: Constitutional:  Vitals:   07/21/20 0406 07/21/20 0743  BP: (!) 132/58 125/68  Pulse: 81 97  Resp: 20 20  Temp: 97.7 F (36.5 C) 98.2 F (36.8 C)  SpO2: 96% 97%   he appears in nad Eyes: anicteric Respiratory: normal respiratory effort; CTA B Cardiovascular: RRR Neuro: confused, does not respond, alert  Review of Systems: Unable to assess due to mental status  Lab Results  Component Value Date   WBC 13.5 (H) 07/20/2020   HGB 13.6 07/20/2020   HCT 40.3 07/20/2020   MCV 83.4 07/20/2020   PLT 184 07/20/2020    Lab Results  Component Value Date   CREATININE 2.38 (H) 07/21/2020   BUN 53 (H) 07/21/2020   NA 139 07/21/2020   K 3.7 07/21/2020   CL 113 (H) 07/21/2020   CO2 16 (L) 07/21/2020    Lab Results  Component Value Date   ALT 14 08/05/2020   AST 44 (H) 07/30/2020   ALKPHOS 69 08/05/2020     Microbiology: Recent Results (from the past 240 hour(s))  Blood Culture (routine x 2)     Status: Abnormal   Collection Time: 08/07/2020  9:40 AM   Specimen: BLOOD RIGHT ARM  Result Value Ref Range Status   Specimen Description BLOOD RIGHT ARM  Final   Special Requests   Final    BOTTLES DRAWN AEROBIC AND ANAEROBIC Blood Culture adequate volume   Culture  Setup Time   Final    GRAM POSITIVE COCCI IN CLUSTERS IN BOTH AEROBIC AND ANAEROBIC BOTTLES Organism ID to follow CRITICAL VALUE NOTED.  VALUE IS CONSISTENT WITH PREVIOUSLY REPORTED AND CALLED VALUE.    Culture (A)  Final    STAPHYLOCOCCUS AUREUS SUSCEPTIBILITIES PERFORMED ON PREVIOUS CULTURE WITHIN THE LAST 5 DAYS. Performed at [facility], [place] [address], [place], [place] [address]    Report Status  07/21/2020 FINAL  Final  Blood Culture (routine x 2)     Status: Abnormal   Collection Time: 07/28/2020  9:50 AM   Specimen: BLOOD RIGHT HAND  Result Value Ref Range Status   Specimen Description BLOOD RIGHT HAND  Final   Special Requests   Final    BOTTLES DRAWN AEROBIC AND ANAEROBIC Blood Culture results may not be optimal due to an inadequate volume of blood received in culture bottles   Culture  Setup Time   Final    GRAM POSITIVE COCCI IN CLUSTERS IN BOTH AEROBIC AND ANAEROBIC BOTTLES Organism ID to follow CRITICAL RESULT CALLED TO, READ BACK BY AND VERIFIED WITH: PHARMD LAURA SEAY BY MESSAN H. AT [id number] ON 07/20/2020 Performed at [facility], [place] [address] [place], [place] [address]    Culture STAPHYLOCOCCUS AUREUS (A)  Final   Report Status 07/21/2020 FINAL  Final   Organism ID, Bacteria STAPHYLOCOCCUS AUREUS  Final      Susceptibility   Staphylococcus aureus - MIC*    CIPROFLOXACIN <=0.5 SENSITIVE Sensitive     ERYTHROMYCIN <=0.25 SENSITIVE Sensitive     GENTAMICIN <=0.5 SENSITIVE Sensitive     OXACILLIN <=0.25 SENSITIVE Sensitive     TETRACYCLINE <=1 SENSITIVE Sensitive     VANCOMYCIN <=0.5 SENSITIVE Sensitive  TRIMETH/SULFA <=10 SENSITIVE Sensitive     CLINDAMYCIN <=0.25 SENSITIVE Sensitive     RIFAMPIN <=0.5 SENSITIVE Sensitive     Inducible Clindamycin NEGATIVE Sensitive     * STAPHYLOCOCCUS AUREUS  Blood Culture ID Panel (Reflexed)     Status: Abnormal   Collection Time: 08/10/2020  9:50 AM  Result Value Ref Range Status   Enterococcus faecalis NOT DETECTED NOT DETECTED Final   Enterococcus Faecium NOT DETECTED NOT DETECTED Final   Listeria monocytogenes NOT DETECTED NOT DETECTED Final   Staphylococcus species DETECTED (A) NOT DETECTED Final    Comment: CRITICAL RESULT CALLED TO, READ BACK BY AND VERIFIED WITH: PHARMD LAURA SEAY BY MESSAN H. AT 0244 ON 08/06/2020    Staphylococcus aureus (BCID) DETECTED (A) NOT DETECTED Final    Comment: CRITICAL RESULT CALLED  TO, READ BACK BY AND VERIFIED WITH: PHARMD LAURA SEAY BY MESSAN H. AT 0244 ON 08/12/2020    Staphylococcus epidermidis NOT DETECTED NOT DETECTED Final   Staphylococcus lugdunensis NOT DETECTED NOT DETECTED Final   Streptococcus species NOT DETECTED NOT DETECTED Final   Streptococcus agalactiae NOT DETECTED NOT DETECTED Final   Streptococcus pneumoniae NOT DETECTED NOT DETECTED Final   Streptococcus pyogenes NOT DETECTED NOT DETECTED Final   A.calcoaceticus-baumannii NOT DETECTED NOT DETECTED Final   Bacteroides fragilis NOT DETECTED NOT DETECTED Final   Enterobacterales NOT DETECTED NOT DETECTED Final   Enterobacter cloacae complex NOT DETECTED NOT DETECTED Final   Escherichia coli NOT DETECTED NOT DETECTED Final   Klebsiella aerogenes NOT DETECTED NOT DETECTED Final   Klebsiella oxytoca NOT DETECTED NOT DETECTED Final   Klebsiella pneumoniae NOT DETECTED NOT DETECTED Final   Proteus species NOT DETECTED NOT DETECTED Final   Salmonella species NOT DETECTED NOT DETECTED Final   Serratia marcescens NOT DETECTED NOT DETECTED Final   Haemophilus influenzae NOT DETECTED NOT DETECTED Final   Neisseria meningitidis NOT DETECTED NOT DETECTED Final   Pseudomonas aeruginosa NOT DETECTED NOT DETECTED Final   Stenotrophomonas maltophilia NOT DETECTED NOT DETECTED Final   Candida albicans NOT DETECTED NOT DETECTED Final   Candida auris NOT DETECTED NOT DETECTED Final   Candida glabrata NOT DETECTED NOT DETECTED Final   Candida krusei NOT DETECTED NOT DETECTED Final   Candida parapsilosis NOT DETECTED NOT DETECTED Final   Candida tropicalis NOT DETECTED NOT DETECTED Final   Cryptococcus neoformans/gattii NOT DETECTED NOT DETECTED Final   Meth resistant mecA/C and MREJ NOT DETECTED NOT DETECTED Final    Comment: Performed at [facility], [address] [place], [place] [address]  Resp Panel by RT-PCR (Flu A&B, Covid) Nasopharyngeal Swab     Status: None   Collection Time: 08/04/2020 10:31 AM    Specimen: Nasopharyngeal Swab; Nasopharyngeal(NP) swabs in vial transport medium  Result Value Ref Range Status   SARS Coronavirus 2 by RT PCR NEGATIVE NEGATIVE Final    Comment: (NOTE) SARS-CoV-2 target nucleic acids are NOT DETECTED.  The SARS-CoV-2 RNA is generally detectable in upper respiratory specimens during the acute phase of infection. The lowest concentration of SARS-CoV-2 viral copies this assay can detect is 138 copies/mL. A negative result does not preclude SARS-Cov-2 infection and should not be used as the sole basis for treatment or other patient management decisions. A negative result may occur with  improper specimen collection/handling, submission of specimen other than nasopharyngeal swab, presence of viral mutation(s) within the areas targeted by this assay, and inadequate number of viral copies(<138 copies/mL). A negative result must be combined with clinical  observations, patient history, and epidemiological information. The expected result is Negative.  Fact Sheet for Patients:  EntrepreneurPulse.com.au  Fact Sheet for Healthcare Providers:  IncredibleEmployment.be  This test is no t yet approved or cleared by the [place] FDA and  has been authorized for detection and/or diagnosis of SARS-CoV-2 by FDA under an Emergency Use Authorization (EUA). This EUA will remain  in effect (meaning this test can be used) for the duration of the COVID-19 declaration under Section 564(b)(1) of the Act, 21 U.S.C.section 360bbb-3(b)(1), unless the authorization is terminated  or revoked sooner.       Influenza A by PCR NEGATIVE NEGATIVE Final   Influenza B by PCR NEGATIVE NEGATIVE Final    Comment: (NOTE) The Xpert Xpress SARS-CoV-2/FLU/RSV plus assay is intended as an aid in the diagnosis of influenza from Nasopharyngeal swab specimens and should not be used as a sole basis for treatment. Nasal washings and aspirates are  unacceptable for Xpert Xpress SARS-CoV-2/FLU/RSV testing.  Fact Sheet for Patients: EntrepreneurPulse.com.au  Fact Sheet for Healthcare Providers: IncredibleEmployment.be  This test is not yet approved or cleared by the [place] FDA and has been authorized for detection and/or diagnosis of SARS-CoV-2 by FDA under an Emergency Use Authorization (EUA). This EUA will remain in effect (meaning this test can be used) for the duration of the COVID-19 declaration under Section 564(b)(1) of the Act, 21 U.S.C. section 360bbb-3(b)(1), unless the authorization is terminated or revoked.  Performed at [facility], [place] [address], [place], [place] [address]   Culture, blood (routine x 2)     Status: None (Preliminary result)   Collection Time: 07/20/20  9:22 AM   Specimen: BLOOD  Result Value Ref Range Status   Specimen Description BLOOD LEFT ANTECUBITAL  Final   Special Requests   Final    BOTTLES DRAWN AEROBIC AND ANAEROBIC Blood Culture results may not be optimal due to an inadequate volume of blood received in culture bottles   Culture   Final    NO GROWTH < 24 HOURS Performed at [facility], [place] [address], [place], [place] [address]    Report Status PENDING  Incomplete  Culture, blood (routine x 2)     Status: None (Preliminary result)   Collection Time: 07/20/20  9:24 AM   Specimen: BLOOD  Result Value Ref Range Status   Specimen Description BLOOD RIGHT ANTECUBITAL  Final   Special Requests   Final    BOTTLES DRAWN AEROBIC AND ANAEROBIC Blood Culture results may not be optimal due to an inadequate volume of blood received in culture bottles   Culture   Final    NO GROWTH < 24 HOURS Performed at [facility], [place] [address] [place], [place] [address]    Report Status PENDING  Incomplete    Impression/Plan:  1. MSSA bacteremia - unlcear source and repeat blood cultures remain ngtd.  TTE without obvious vegetation.  At  this point with his confusion, I do not think he is a good candidate for TEE so will hold of on the request for now.   2.  Confusion - more confused this am.  Did not sleep overnight so may be partly to explain it.  Will continue to follow.   3. Renal insufficiency - chronic kidney disease stage IV and some acute worsening.  Creat improved to 2.38 now which is more c/w his baseline.  Will continue to monitor

## 2020-07-21 NOTE — TOC Initial Note (Signed)
Transition of Care [facility]) - Initial/Assessment Note    Patient Details  Name: Richard Sullivan MRN: [id number] Date of Birth: 07-13-1930  Transition of Care [facility]) CM/SW Contact:    Vinie Sill, LCSW Phone Number: 07/21/2020, 11:10 AM  Clinical Narrative:                  CSW spoke with patient's son,Jim,by phone. CSW introduced self and explained role. CSW discussed short term rehab at [facility]. Patient's son states he is agreeable to short term rehab- and was very clear that he wants the patient to return home after rehab. He states he can provide the care the patient needs after rehab. Patient has not received covid vaccines. CSW explained the SNF process. CSW answered all questions.   CSW will provide bed offers once available. CSW will continue to follow and assist with discharge planning.  Thurmond Butts, MSW, LCSW Clinical Social Worker    Expected Discharge Plan: Skilled Nursing Facility Barriers to Discharge: Insurance Authorization,Continued Medical Work up,SNF Pending bed offer   Patient Goals and CMS Choice        Expected Discharge Plan and Services Expected Discharge Plan: [place] In-house Referral: Clinical Social Work                                            Prior Living Arrangements/Services   Lives with:: Self Patient language and need for interpreter reviewed:: No        Need for Family Participation in Patient Care: Yes (Comment) Care giver support system in place?: Yes (comment)   Criminal Activity/Legal Involvement Pertinent to Current Situation/Hospitalization: No - Comment as needed  Activities of Daily Living Home Assistive Devices/Equipment: None ADL Screening (condition at time of admission) Patient's cognitive ability adequate to safely complete daily activities?: Yes Is the patient deaf or have difficulty hearing?: Yes Does the patient have difficulty seeing, even when wearing glasses/contacts?:  Yes Does the patient have difficulty concentrating, remembering, or making decisions?: No Patient able to express need for assistance with ADLs?: No Does the patient have difficulty dressing or bathing?: No Independently performs ADLs?: Yes (appropriate for developmental age) Does the patient have difficulty walking or climbing stairs?: No Weakness of Legs: None Weakness of Arms/Hands: None  Permission Sought/Granted   Permission granted to share information with : Yes, Verbal Permission Granted  Share Information with NAME: Ulis Streets  Permission granted to share info w AGENCY: SNFs  Permission granted to share info w Relationship: son  Permission granted to share info w Contact Information: [phone]  Emotional Assessment           Psych Involvement: No (comment)  Admission diagnosis:  Acute on chronic renal failure ([place]) [N17.9, N18.9] Acute respiratory failure with hypoxia ([place]) [J96.01] Community acquired pneumonia, unspecified laterality [J18.9] Sepsis, due to unspecified organism, unspecified whether acute organ dysfunction present [facility] [place]) [A41.9] Patient Active Problem List   Diagnosis Date Noted  . Protein-calorie malnutrition, severe 07/20/2020  . Bacteremia due to methicillin susceptible Staphylococcus aureus (MSSA) 07/19/2020  . Acute respiratory failure with hypoxia ([place]) 08/15/2020  . Sepsis ([place]) 08/12/2020  . Lactic acidosis 08/09/2020  . Community acquired pneumonia 07/22/2020  . Pulmonary vascular congestion 08/07/2020  . Severe aortic stenosis 07/25/2020  . Atrial flutter ([place]) 07/20/2020  . Hyponatremia 07/27/2020  . Acute renal failure superimposed on stage 4 chronic kidney disease (  [place]) 07/23/2020  . History of anemia due to CKD 08/14/2020  . Primary hypertension 08/06/2020  . BPH (benign prostatic hyperplasia) 07/23/2020  . Fall at home, initial encounter 08/02/2020  . History of gout 08/14/2020  . Hyperkalemia 08/02/2016  . Abnormal urine  finding 03/03/2016  . Atrial fibrillation with rapid ventricular response ([place])   . Bronchitis 07/28/2015  . Chronic bronchitis with acute exacerbation ([place]) 07/28/2015  . Tachyarrhythmia 07/28/2015  . Thrombocytopenia ([place]) 04/29/2015  . Left carotid bruit 06/24/2014  . Chronic kidney disease 06/24/2014  . Subclinical hypothyroidism 06/17/2014  . Preventative health care 12/13/2013  . Chronic leukopenia 12/13/2013  . Microscopic hematuria 08/13/2013  . HTN (hypertension), benign 08/20/2012  . Hyperlipidemia 08/20/2012  . Chronic kidney disease (CKD), stage III (moderate) ([place]) 08/20/2012  . Heart murmur, systolic [id number]   PCP:  Tammi Sou, MD Pharmacy:   [facility] [place] [address] Phone: [phone] Fax: [phone]  [facility] [place] [address], [place] [id number] [place] [address] Phone: [phone] Fax: [phone]     Social Determinants of Health (SDOH) Interventions    Readmission Risk Interventions No flowsheet data found.

## 2020-07-21 NOTE — NC FL2 (Signed)
[place] LEVEL OF CARE SCREENING TOOL     IDENTIFICATION  Patient Name: Richard Sullivan Birthdate: June 11, 1930 Sex: male Admission Date (Current Location): 07/27/2020  [facility] and [place] Number:  Herbalist and Address:  [facility], [place] [address], [place], [place] [address]      Provider Number: [id number]  Attending Physician Name and Address:  Barb Merino, MD  Relative Name and Phone Number:       Current Level of Care: Hospital Recommended Level of Care: [place] Prior Approval Number:    Date Approved/Denied:   PASRR Number: [id number]  Discharge Plan: SNF    Current Diagnoses: Patient Active Problem List   Diagnosis Date Noted  . Protein-calorie malnutrition, severe 07/20/2020  . Bacteremia due to methicillin susceptible Staphylococcus aureus (MSSA) 07/19/2020  . Acute respiratory failure with hypoxia ([place]) 08/01/2020  . Sepsis ([place]) 08/01/2020  . Lactic acidosis 08/01/2020  . Community acquired pneumonia 08/04/2020  . Pulmonary vascular congestion 08/03/2020  . Severe aortic stenosis 07/31/2020  . Atrial flutter ([place]) 07/17/2020  . Hyponatremia 07/25/2020  . Acute renal failure superimposed on stage 4 chronic kidney disease ([place]) 07/26/2020  . History of anemia due to CKD 08/14/2020  . Primary hypertension 08/01/2020  . BPH (benign prostatic hyperplasia) 08/12/2020  . Fall at home, initial encounter 07/31/2020  . History of gout 07/17/2020  . Hyperkalemia 08/02/2016  . Abnormal urine finding 03/03/2016  . Atrial fibrillation with rapid ventricular response ([place])   . Bronchitis 07/28/2015  . Chronic bronchitis with acute exacerbation ([place]) 07/28/2015  . Tachyarrhythmia 07/28/2015  . Thrombocytopenia ([place]) 04/29/2015  . Left carotid bruit 06/24/2014  . Chronic kidney disease 06/24/2014  . Subclinical hypothyroidism 06/17/2014  . Preventative health care 12/13/2013  . Chronic  leukopenia 12/13/2013  . Microscopic hematuria 08/13/2013  . HTN (hypertension), benign 08/20/2012  . Hyperlipidemia 08/20/2012  . Chronic kidney disease (CKD), stage III (moderate) ([place]) 08/20/2012  . Heart murmur, systolic [id number]    Orientation RESPIRATION BLADDER Height & Weight        Normal External catheter,Incontinent Weight: 165 lb 9.1 oz (75.1 kg) Height:  '5\' 11"'$  (180.3 cm)  BEHAVIORAL SYMPTOMS/MOOD NEUROLOGICAL BOWEL NUTRITION STATUS      Continent Diet (please see discharge summary)  AMBULATORY STATUS COMMUNICATION OF NEEDS Skin   Supervision Verbally Normal                       Personal Care Assistance Level of Assistance  Bathing,Feeding,Dressing Bathing Assistance: Maximum assistance Feeding assistance: Limited assistance Dressing Assistance: Limited assistance     Functional Limitations Info  Sight,Hearing,Speech Sight Info: Adequate Hearing Info: Adequate Speech Info: Adequate    SPECIAL CARE FACTORS FREQUENCY  OT (By licensed OT),PT (By licensed PT)     PT Frequency: 5x per week OT Frequency: 5x per week            Contractures Contractures Info: Not present    Additional Factors Info  Code Status,Allergies Code Status Info: DNR Allergies Info: NKA           Current Medications (07/21/2020):  This is the current hospital active medication list Current Facility-Administered Medications  Medication Dose Route Frequency Provider Last Rate Last Admin  . acetaminophen (TYLENOL) tablet 650 mg  650 mg Oral Q6H PRN Nicoletta Dress, Na, MD   650 mg at 07/20/20 1718   Or  . acetaminophen (TYLENOL) suppository 650 mg  650 mg Rectal Q6H  PRN Charlann Lange, MD      . amitriptyline (ELAVIL) tablet 50 mg  50 mg Oral QHS Nicoletta Dress, Na, MD   50 mg at 07/19/20 2203  . busPIRone (BUSPAR) tablet 5 mg  5 mg Oral q1800 Rosezella Rumpf, NP   5 mg at 07/20/20 1718  . ceFAZolin (ANCEF) IVPB 2g/100 mL premix  2 g Intravenous Q12H Laren Everts, RPH 200 mL/hr at 07/21/20 0955 2  g at 07/21/20 0955  . ezetimibe (ZETIA) tablet 10 mg  10 mg Oral Daily Li, Na, MD   10 mg at 07/20/20 1032  . feeding supplement (ENSURE ENLIVE / ENSURE PLUS) liquid 237 mL  237 mL Oral BID BM Li, Na, MD   237 mL at 07/19/20 1403  . guaiFENesin-dextromethorphan (ROBITUSSIN DM) 100-10 MG/5ML syrup 5 mL  5 mL Oral Q4H PRN Nicoletta Dress, Na, MD      . haloperidol (HALDOL) tablet 5 mg  5 mg Oral QHS PRN Barb Merino, MD      . ipratropium-albuterol (DUONEB) 0.5-2.5 (3) MG/3ML nebulizer solution 3 mL  3 mL Nebulization Q6H PRN Li, Na, MD      . metoprolol succinate (TOPROL-XL) 24 hr tablet 50 mg  50 mg Oral Daily Li, Na, MD   50 mg at 07/20/20 1032  . multivitamin with minerals tablet 1 tablet  1 tablet Oral Daily Barb Merino, MD      . ondansetron (ZOFRAN) tablet 4 mg  4 mg Oral Q6H PRN Nicoletta Dress, Na, MD       Or  . ondansetron (ZOFRAN) injection 4 mg  4 mg Intravenous Q6H PRN Li, Na, MD      . polyethylene glycol (MIRALAX / GLYCOLAX) packet 17 g  17 g Oral Daily PRN Nicoletta Dress, Na, MD      . tamsulosin (FLOMAX) capsule 0.4 mg  0.4 mg Oral Daily Li, Na, MD   0.4 mg at 07/20/20 1032     Discharge Medications: Please see discharge summary for a list of discharge medications.  Relevant Imaging Results:  Relevant Lab Results:   Additional Information SSN [id number]  Patient has NOT recieved covid vaccine (patient needs out patient palliative to follow at SNF)  Vinie Sill, LCSW

## 2020-07-21 NOTE — Progress Notes (Signed)
Patient presents with significantly improved alertness over the last three hours. At approximately 1500 patient began to be more responsive. And when dinner arrived he was able to eat a small amount without difficulty. Patient still states he is not hungry but did agree to eat. Previous to this time patient was unwilling and seemingly unable to safely eat. He has been showing increased alertness in the evening with decreased function in the night and morning. Will continue to monitor.   -Donnelly Angelica, RN

## 2020-07-22 ENCOUNTER — Ambulatory Visit: Payer: Medicare Other | Admitting: Cardiovascular Disease

## 2020-07-22 DIAGNOSIS — J9601 Acute respiratory failure with hypoxia: Secondary | ICD-10-CM

## 2020-07-22 DIAGNOSIS — R410 Disorientation, unspecified: Secondary | ICD-10-CM | POA: Diagnosis not present

## 2020-07-22 DIAGNOSIS — B9561 Methicillin susceptible Staphylococcus aureus infection as the cause of diseases classified elsewhere: Secondary | ICD-10-CM | POA: Diagnosis not present

## 2020-07-22 DIAGNOSIS — R7881 Bacteremia: Secondary | ICD-10-CM | POA: Diagnosis not present

## 2020-07-22 LAB — CBC WITH DIFFERENTIAL/PLATELET
Abs Immature Granulocytes: 0.09 10*3/uL — ABNORMAL HIGH (ref 0.00–0.07)
Basophils Absolute: 0 10*3/uL (ref 0.0–0.1)
Basophils Relative: 0 %
Eosinophils Absolute: 0 10*3/uL (ref 0.0–0.5)
Eosinophils Relative: 0 %
HCT: 42.9 % (ref 39.0–52.0)
Hemoglobin: 14.2 g/dL (ref 13.0–17.0)
Immature Granulocytes: 1 %
Lymphocytes Relative: 4 %
Lymphs Abs: 0.4 10*3/uL — ABNORMAL LOW (ref 0.7–4.0)
MCH: 27.3 pg (ref 26.0–34.0)
MCHC: 33.1 g/dL (ref 30.0–36.0)
MCV: 82.5 fL (ref 80.0–100.0)
Monocytes Absolute: 0.8 10*3/uL (ref 0.1–1.0)
Monocytes Relative: 7 %
Neutro Abs: 9.3 10*3/uL — ABNORMAL HIGH (ref 1.7–7.7)
Neutrophils Relative %: 88 %
Platelets: 146 10*3/uL — ABNORMAL LOW (ref 150–400)
RBC: 5.2 MIL/uL (ref 4.22–5.81)
RDW: 16.5 % — ABNORMAL HIGH (ref 11.5–15.5)
WBC: 10.5 10*3/uL (ref 4.0–10.5)
nRBC: 0 % (ref 0.0–0.2)

## 2020-07-22 LAB — BASIC METABOLIC PANEL
Anion gap: 9 (ref 5–15)
BUN: 51 mg/dL — ABNORMAL HIGH (ref 8–23)
CO2: 16 mmol/L — ABNORMAL LOW (ref 22–32)
Calcium: 9.6 mg/dL (ref 8.9–10.3)
Chloride: 117 mmol/L — ABNORMAL HIGH (ref 98–111)
Creatinine, Ser: 2.16 mg/dL — ABNORMAL HIGH (ref 0.61–1.24)
GFR, Estimated: 29 mL/min — ABNORMAL LOW (ref >60)
Glucose, Bld: 97 mg/dL (ref 70–99)
Potassium: 3.5 mmol/L (ref 3.5–5.1)
Sodium: 142 mmol/L (ref 135–145)

## 2020-07-22 NOTE — Progress Notes (Signed)
[facility]  Date of Admission:  08/15/2020     Total days of antibiotics 5         ASSESSMENT:  Richard Sullivan is more alert today and able to answer simple questions. Blood cultures from 4/4 have remained without growth to date and appears to be tolerating Cefazolin with no adverse side effects. Source of infection remains unclear. Given his current situation would continue to hold on any TEE and prefer treatment for longer duration. Hold PICC line placement until blood cultures have cleared. Continue current dose of cefazolin. Delirium prevention per primary team. Disposition may be to Skilled Nursing.   PLAN:  1. Continue Cefazolin.  2. Monitor blood cultures for clearance of bacteremia. 3. Hold PICC line placement until blood cultures cleared. 4. Delirium prevention per primary team.    Principal Problem:   Acute respiratory failure with hypoxia ([place]) Active Problems:   Hyperlipidemia   Subclinical hypothyroidism   Sepsis ([place])   Lactic acidosis   Community acquired pneumonia   Pulmonary vascular congestion   Severe aortic stenosis   Atrial flutter ([facility])   Hyponatremia   Acute renal failure superimposed on stage 4 chronic kidney disease ([facility])   History of anemia due to CKD   Primary hypertension   BPH (benign prostatic hyperplasia)   Fall at home, initial encounter   History of gout   Bacteremia due to methicillin susceptible Staphylococcus aureus (MSSA)   Protein-calorie malnutrition, severe   . amitriptyline  50 mg Oral QHS  . busPIRone  5 mg Oral q1800  . ezetimibe  10 mg Oral Daily  . feeding supplement  237 mL Oral BID BM  . metoprolol succinate  50 mg Oral Daily  . multivitamin with minerals  1 tablet Oral Daily  . tamsulosin  0.4 mg Oral Daily    SUBJECTIVE:  Afebrile overnight with no acute events. More alert today. Son is present. Denies pain. Per nursing has continued to be without sleep.   No Known  Allergies   Review of Systems: Review of Systems  Constitutional: Negative for chills, fever and weight loss.  Respiratory: Negative for cough, shortness of breath and wheezing.   Cardiovascular: Negative for chest pain and leg swelling.  Gastrointestinal: Negative for abdominal pain, constipation, diarrhea, nausea and vomiting.  Skin: Negative for rash.      OBJECTIVE: Vitals:   07/22/20 0419 07/22/20 0428 07/22/20 0437 07/22/20 0720  BP: (!) 101/54 97/63 (!) 95/48 122/73  Pulse: (!) 111 (!) 102 98 (!) 104  Resp: '20 20 20 20  '$ Temp: 98.8 F (37.1 C) 98.8 F (37.1 C) 98.8 F (37.1 C) (!) 97.4 F (36.3 C)  TempSrc: Oral Oral Oral Oral  SpO2: 96% 96% 96% 97%  Weight:      Height:       Body mass index is 23.09 kg/m.  Physical Exam Constitutional:      General: He is not in acute distress.    Appearance: He is well-developed.     Comments: Lying in bed with head of bed elevated to 90 degrees.   Cardiovascular:     Rate and Rhythm: Normal rate and regular rhythm.     Heart sounds: Normal heart sounds.  Pulmonary:     Effort: Pulmonary effort is normal.     Breath sounds: Normal breath sounds.  Skin:    General: Skin is warm and dry.  Neurological:     Mental Status: He is alert.  Lab Results Lab Results  Component Value Date   WBC 10.5 07/22/2020   HGB 14.2 07/22/2020   HCT 42.9 07/22/2020   MCV 82.5 07/22/2020   PLT 146 (L) 07/22/2020    Lab Results  Component Value Date   CREATININE 2.16 (H) 07/22/2020   BUN 51 (H) 07/22/2020   NA 142 07/22/2020   K 3.5 07/22/2020   CL 117 (H) 07/22/2020   CO2 16 (L) 07/22/2020    Lab Results  Component Value Date   ALT 14 08/11/2020   AST 44 (H) 08/02/2020   ALKPHOS 69 07/27/2020   BILITOT 1.8 (H) 08/09/2020     Microbiology: Recent Results (from the past 240 hour(s))  Blood Culture (routine x 2)     Status: Abnormal   Collection Time: 08/14/2020  9:40 AM   Specimen: BLOOD RIGHT ARM  Result Value Ref  Range Status   Specimen Description BLOOD RIGHT ARM  Final   Special Requests   Final    BOTTLES DRAWN AEROBIC AND ANAEROBIC Blood Culture adequate volume   Culture  Setup Time   Final    GRAM POSITIVE COCCI IN CLUSTERS IN BOTH AEROBIC AND ANAEROBIC BOTTLES Organism ID to follow CRITICAL VALUE NOTED.  VALUE IS CONSISTENT WITH PREVIOUSLY REPORTED AND CALLED VALUE.    Culture (A)  Final    STAPHYLOCOCCUS AUREUS SUSCEPTIBILITIES PERFORMED ON PREVIOUS CULTURE WITHIN THE LAST 5 DAYS. Performed at [facility], [place] [address] [place], [place] [address]    Report Status 07/21/2020 FINAL  Final  Blood Culture (routine x 2)     Status: Abnormal   Collection Time: 08/10/2020  9:50 AM   Specimen: BLOOD RIGHT HAND  Result Value Ref Range Status   Specimen Description BLOOD RIGHT HAND  Final   Special Requests   Final    BOTTLES DRAWN AEROBIC AND ANAEROBIC Blood Culture results may not be optimal due to an inadequate volume of blood received in culture bottles   Culture  Setup Time   Final    GRAM POSITIVE COCCI IN CLUSTERS IN BOTH AEROBIC AND ANAEROBIC BOTTLES Organism ID to follow CRITICAL RESULT CALLED TO, READ BACK BY AND VERIFIED WITH: PHARMD LAURA SEAY BY MESSAN H. AT [id number] ON 08/08/2020 Performed at [facility], [place] [address] [place], [place] [address]    Culture STAPHYLOCOCCUS AUREUS (A)  Final   Report Status 07/21/2020 FINAL  Final   Organism ID, Bacteria STAPHYLOCOCCUS AUREUS  Final      Susceptibility   Staphylococcus aureus - MIC*    CIPROFLOXACIN <=0.5 SENSITIVE Sensitive     ERYTHROMYCIN <=0.25 SENSITIVE Sensitive     GENTAMICIN <=0.5 SENSITIVE Sensitive     OXACILLIN <=0.25 SENSITIVE Sensitive     TETRACYCLINE <=1 SENSITIVE Sensitive     VANCOMYCIN <=0.5 SENSITIVE Sensitive     TRIMETH/SULFA <=10 SENSITIVE Sensitive     CLINDAMYCIN <=0.25 SENSITIVE Sensitive     RIFAMPIN <=0.5 SENSITIVE Sensitive     Inducible Clindamycin NEGATIVE Sensitive     *  STAPHYLOCOCCUS AUREUS  Blood Culture ID Panel (Reflexed)     Status: Abnormal   Collection Time: 07/28/2020  9:50 AM  Result Value Ref Range Status   Enterococcus faecalis NOT DETECTED NOT DETECTED Final   Enterococcus Faecium NOT DETECTED NOT DETECTED Final   Listeria monocytogenes NOT DETECTED NOT DETECTED Final   Staphylococcus species DETECTED (A) NOT DETECTED Final    Comment: CRITICAL RESULT CALLED TO, READ BACK BY AND VERIFIED WITH:  PHARMD LAURA SEAY BY MESSAN H. AT [id number] ON 07/26/2020    Staphylococcus aureus (BCID) DETECTED (A) NOT DETECTED Final    Comment: CRITICAL RESULT CALLED TO, READ BACK BY AND VERIFIED WITH: PHARMD LAURA SEAY BY MESSAN H. AT [id number] ON 07/29/2020    Staphylococcus epidermidis NOT DETECTED NOT DETECTED Final   Staphylococcus lugdunensis NOT DETECTED NOT DETECTED Final   Streptococcus species NOT DETECTED NOT DETECTED Final   Streptococcus agalactiae NOT DETECTED NOT DETECTED Final   Streptococcus pneumoniae NOT DETECTED NOT DETECTED Final   Streptococcus pyogenes NOT DETECTED NOT DETECTED Final   A.calcoaceticus-baumannii NOT DETECTED NOT DETECTED Final   Bacteroides fragilis NOT DETECTED NOT DETECTED Final   Enterobacterales NOT DETECTED NOT DETECTED Final   Enterobacter cloacae complex NOT DETECTED NOT DETECTED Final   Escherichia coli NOT DETECTED NOT DETECTED Final   Klebsiella aerogenes NOT DETECTED NOT DETECTED Final   Klebsiella oxytoca NOT DETECTED NOT DETECTED Final   Klebsiella pneumoniae NOT DETECTED NOT DETECTED Final   Proteus species NOT DETECTED NOT DETECTED Final   Salmonella species NOT DETECTED NOT DETECTED Final   Serratia marcescens NOT DETECTED NOT DETECTED Final   Haemophilus influenzae NOT DETECTED NOT DETECTED Final   Neisseria meningitidis NOT DETECTED NOT DETECTED Final   Pseudomonas aeruginosa NOT DETECTED NOT DETECTED Final   Stenotrophomonas maltophilia NOT DETECTED NOT DETECTED Final   Candida albicans NOT DETECTED NOT DETECTED  Final   Candida auris NOT DETECTED NOT DETECTED Final   Candida glabrata NOT DETECTED NOT DETECTED Final   Candida krusei NOT DETECTED NOT DETECTED Final   Candida parapsilosis NOT DETECTED NOT DETECTED Final   Candida tropicalis NOT DETECTED NOT DETECTED Final   Cryptococcus neoformans/gattii NOT DETECTED NOT DETECTED Final   Meth resistant mecA/C and MREJ NOT DETECTED NOT DETECTED Final    Comment: Performed at [facility], [address] [place], [place] [address]  Resp Panel by RT-PCR (Flu A&B, Covid) Nasopharyngeal Swab     Status: None   Collection Time: 08/06/2020 10:31 AM   Specimen: Nasopharyngeal Swab; Nasopharyngeal(NP) swabs in vial transport medium  Result Value Ref Range Status   SARS Coronavirus 2 by RT PCR NEGATIVE NEGATIVE Final    Comment: (NOTE) SARS-CoV-2 target nucleic acids are NOT DETECTED.  The SARS-CoV-2 RNA is generally detectable in upper respiratory specimens during the acute phase of infection. The lowest concentration of SARS-CoV-2 viral copies this assay can detect is 138 copies/mL. A negative result does not preclude SARS-Cov-2 infection and should not be used as the sole basis for treatment or other patient management decisions. A negative result may occur with  improper specimen collection/handling, submission of specimen other than nasopharyngeal swab, presence of viral mutation(s) within the areas targeted by this assay, and inadequate number of viral copies(<138 copies/mL). A negative result must be combined with clinical observations, patient history, and epidemiological information. The expected result is Negative.  Fact Sheet for Patients:  EntrepreneurPulse.com.au  Fact Sheet for Healthcare Providers:  IncredibleEmployment.be  This test is no t yet approved or cleared by the [place] FDA and  has been authorized for detection and/or diagnosis of SARS-CoV-2 by FDA under an Emergency Use  Authorization (EUA). This EUA will remain  in effect (meaning this test can be used) for the duration of the COVID-19 declaration under Section 564(b)(1) of the Act, 21 U.S.C.section 360bbb-3(b)(1), unless the authorization is terminated  or revoked sooner.       Influenza A by PCR NEGATIVE NEGATIVE Final  Influenza B by PCR NEGATIVE NEGATIVE Final    Comment: (NOTE) The Xpert Xpress SARS-CoV-2/FLU/RSV plus assay is intended as an aid in the diagnosis of influenza from Nasopharyngeal swab specimens and should not be used as a sole basis for treatment. Nasal washings and aspirates are unacceptable for Xpert Xpress SARS-CoV-2/FLU/RSV testing.  Fact Sheet for Patients: EntrepreneurPulse.com.au  Fact Sheet for Healthcare Providers: IncredibleEmployment.be  This test is not yet approved or cleared by the [place] FDA and has been authorized for detection and/or diagnosis of SARS-CoV-2 by FDA under an Emergency Use Authorization (EUA). This EUA will remain in effect (meaning this test can be used) for the duration of the COVID-19 declaration under Section 564(b)(1) of the Act, 21 U.S.C. section 360bbb-3(b)(1), unless the authorization is terminated or revoked.  Performed at [facility], [place] [address] [place], [place] [address]   Culture, blood (routine x 2)     Status: None (Preliminary result)   Collection Time: 07/20/20  9:22 AM   Specimen: BLOOD  Result Value Ref Range Status   Specimen Description BLOOD LEFT ANTECUBITAL  Final   Special Requests   Final    BOTTLES DRAWN AEROBIC AND ANAEROBIC Blood Culture results may not be optimal due to an inadequate volume of blood received in culture bottles   Culture   Final    NO GROWTH < 24 HOURS Performed at [facility], [place] [address], [place], [place] [address]    Report Status PENDING  Incomplete  Culture, blood (routine x 2)     Status: None (Preliminary result)    Collection Time: 07/20/20  9:24 AM   Specimen: BLOOD  Result Value Ref Range Status   Specimen Description BLOOD RIGHT ANTECUBITAL  Final   Special Requests   Final    BOTTLES DRAWN AEROBIC AND ANAEROBIC Blood Culture results may not be optimal due to an inadequate volume of blood received in culture bottles   Culture   Final    NO GROWTH < 24 HOURS Performed at [facility], [place] [address], [place], [place] [address]    Report Status PENDING  Incomplete     Terri Piedra, NP [place] [facility]  07/22/2020  10:20 AM

## 2020-07-22 NOTE — Progress Notes (Signed)
Speech Language Pathology Treatment: Dysphagia  Patient Details Name: Richard Sullivan MRN: [id number] DOB: 06-26-1930 Today's Date: 07/22/2020 Time: [id number] SLP Time Calculation (min) (ACUTE ONLY): 12 min  Assessment / Plan / Recommendation Clinical Impression  Pt is alert and cooperative today, although still showing signs of a cognitively-based oral dysphagia including reduced awareness of boluses. He tends to suck on the spoon to get the applesauce, but does respond to cues from SLP to open his mouth further to accept the spoon. Oral transit fluctuates at times, needing occasional cues to swallow, but he does not have oral residue. Per RN. Son may be looking for his dentures to bring in here. Given his mentation, recommend continuing with his pureed diet and thin liquids for now, with potential to advance when dentures are present if his mentation improves further.    HPI HPI: Pt is an 87 y.o. male admitted 07/29/2020 with SOB, cough, generalized weakness, falls. Workup for acute hypoxic respiratory failure due to CHF/fluid overload; potential for atypical PNA. 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 Pt with worsening confusion; repeat head CT 4/4 with minimal enlargement of subdural collections, consistent with minimal additional subdural bleeding, mild mass-effect, no midline shift. PMH includes severe aortic stenosis, aflutter on Eliquis, HTN, CKD IV, anemia, BPH, gout, depression      SLP Plan  Continue with current plan of care       Recommendations  Diet recommendations: Dysphagia 1 (puree);Thin liquid Liquids provided via: Cup;Straw Medication Administration: Crushed with puree Supervision: Staff to assist with self feeding;Full supervision/cueing for compensatory strategies Compensations: Minimize environmental distractions;Slow rate;Small sips/bites;Other (Comment) (check for oral residue) Postural Changes and/or Swallow  Maneuvers: Seated upright 90 degrees                Oral Care Recommendations: Oral care BID Follow up Recommendations: Skilled Nursing facility SLP Visit Diagnosis: Dysphagia, unspecified (R13.10) Plan: Continue with current plan of care       GO                 Richard Sullivan., M.A. [facility] Pager [phone] Office [phone]  07/22/2020, 2:48 PM

## 2020-07-22 NOTE — Progress Notes (Signed)
PROGRESS NOTE  Richard Sullivan [id number] DOB: Mar 03, 1931 DOA: 07/17/2020 PCP: Tammi Sou, MD  HPI/Recap of past 19 hours: 87 year old gentleman with history of severe critical aortic stenosis on conservative management not amenable to surgery, paroxysmal a flutter on Eliquis, hypertension, hyperlipidemia, chronic kidney disease stage IV, anemia of chronic disease, hypothyroidism, gout and depression presented with shortness of breath ,cough and generalized weakness for about 3 days.  Patient reports about 1 week not feeling well, nonproductive cough.  Symptoms worse for last 24 hours.  Son called EMS.  Patient lives at home with his son next door.  In the emergency room, temperature one 101.4.  Heart rate 104.  Respiratory 26.  On room air.  Lactic acid 2.6.  Covid and influenza negative.  Chest x-ray with mild cardiomegaly, pulmonary vascular congestion and interstitial opacities.  He was subsequently found to have AKI, MSSA bacteremia.  Primary source unknown. Skeletal survey in the ER with CT scan of the cervical spine was normal. Head CT showed bilateral hygroma, thought to be chronic subdural.  Patient has flat affect and difficult to assess mental status at many times.  Remains in the hospital with MSSA bacteremia, poor appetite and on active treatment.  Has episodes of confusion, however easily reoriented.  07/22/20: Patient was seen at his bedside.  His son was present.  Nonverbal with flat affect.  Seen by infectious disease, greatly appreciate assistance.  With his declining status likely not a good candidate for TEE, ID will treat for a full 6 weeks with IV antibiotics.  Likely will have PICC line placed on 07/23/2020 if repeat blood cultures remain negative.  Assessment/Plan: Principal Problem:   Acute respiratory failure with hypoxia (HCC) Active Problems:   Hyperlipidemia   Subclinical hypothyroidism   Sepsis ([place])   Lactic acidosis   Community acquired pneumonia    Pulmonary vascular congestion   Severe aortic stenosis   Atrial flutter (HCC)   Hyponatremia   Acute renal failure superimposed on stage 4 chronic kidney disease (HCC)   History of anemia due to CKD   Primary hypertension   BPH (benign prostatic hyperplasia)   Fall at home, initial encounter   History of gout   Bacteremia due to methicillin susceptible Staphylococcus aureus (MSSA)   Protein-calorie malnutrition, severe  Severe Sepsis present on admission secondary MSSA bacteremia, primary source unknown, possible pneumonia versus septic pulmonary emboli. Patient adequately resuscitated.  Blood pressures fairly stable.  Lactic acid normalized. He was given very conservative IV fluids with severe aortic stenosis and pulmonary edema present. Remains on Ancef.  Followed by ID.   Blood cultures 4/2, 4/4 MSSA Blood cultures 4/4, no growth to date. 2D echocardiogram with severe aortic stenosis , no evidence of endocarditis.   Patient is not stable enough to go for TEE.  Anticipate prolonged antibiotic therapy if clinical improvement. Plan for PICC line placement on 07/23/2020 if blood cultures remain negative per ID.   Will require 6 weeks of IV antibiotics.   Monitor fever curve and WBC.  Acute hypoxic respiratory failure secondary to atypical pneumonia versus septic pulmonary emboli Not on oxygen supplementation at baseline Currently requiring 2 L to maintain O2 saturation greater than 90% Will need home oxygen evaluation prior to DC.  AKI on chronic kidney disease stage IV: Baseline creatinine reported about 2.   Presented with creatinine more than 3.  Has received gentle IV fluid hydration. Monitor urine output Avoid nephrotoxic agents Repeat renal panel in the morning.  History of  severe aortic stenosis, a flutter on Eliquis:  Heart rate is fluctuating, back on a-flutter.   Continue Toprol-XL 50 mg daily.   Continue to closely monitor on telemetry.    Acute metabolic  encephalopathy in the setting of bilateral subdural hematoma, thought to be chronic, evaluated by neurosurgery. Per bedside RN and per his son at bedside his mentation has improved today. Continue to reorient as needed Delirium precautions.  Goals of care Currently DNR Seen by palliative care team.  Essential hypertension: BP is currently at goal He is on Toprol-XL 50 mg daily  BPH Continue home Flomax  Hyperlipidemia Continue home Zetia   Chronic depression Continue home amitriptyline  Bilateral subdural hematoma/hygroma:   Dr. Barb Merino discussed with neurosurgery.  They thought this is chronic. If absolutely needed, neurosurgery is okay with continuing Eliquis.  Patient with poor recovery and unknown outcome, will discontinue Eliquis to decrease incidence of bleeding.   DVT prophylaxis: SCDs Start: 08/11/2020 1412 SCDs Start: 08/08/2020 1407   Code Status: DNR Family Communication: Patient's son at the bedside 4/6.   Disposition Plan: Status is: Inpatient  Remains inpatient appropriate because:Hemodynamically unstable, IV treatments appropriate due to intensity of illness or inability to take PO and Inpatient level of care appropriate due to severity of illness   Dispo: The patient is from: Home  Anticipated d/c is to: SNF or home with home health services and 24-hour supervision.  Patient currently is not medically stable to d/c.              Difficult to place patient No         Consultants:   Palliative care  Infectious disease  Procedures:   None       Objective: Vitals:   07/22/20 0419 07/22/20 0428 07/22/20 0437 07/22/20 0720  BP: (!) 101/54 97/63 (!) 95/48 122/73  Pulse: (!) 111 (!) 102 98 (!) 104  Resp: '20 20 20 20  '$ Temp: 98.8 F (37.1 C) 98.8 F (37.1 C) 98.8 F (37.1 C) (!) 97.4 F (36.3 C)  TempSrc: Oral Oral Oral Oral  SpO2: 96% 96% 96% 97%  Weight:      Height:         Intake/Output Summary (Last 24 hours) at 07/22/2020 1522 Last data filed at 07/22/2020 0654 Gross per 24 hour  Intake 580 ml  Output 1150 ml  Net -570 ml   Filed Weights   08/09/2020 1020 07/26/2020 1256  Weight: 75.1 kg 75.1 kg    Exam:  . General: 87 y.o. year-old male well developed well nourished in no acute distress.  Alert and nonverbal . . Cardiovascular: Tachycardic with no rubs or gallops.  No thyromegaly or JVD noted.   Marland Kitchen Respiratory: Mild diffuse rales bilaterally with no wheezes noted.  Poor inspiratory effort. . Abdomen: Soft nontender nondistended with normal bowel sounds x4 quadrants. . Musculoskeletal: No lower extremity edema.  . Skin: No ulcerative lesions noted or rashes. . Psychiatry: Mood is appropriate for condition and setting   Data Reviewed: CBC: Recent Labs  Lab 08/12/2020 0940 07/17/2020 1047 08/05/2020 1339 07/19/20 0203 07/20/20 0924 07/22/20 0128  WBC 12.1*  --   --  12.0* 13.5* 10.5  NEUTROABS 11.2*  --   --  11.4* 12.8* 9.3*  HGB 13.0 12.6* 10.9* 12.6* 13.6 14.2  HCT 39.6 37.0* 32.0* 38.1* 40.3 42.9  MCV 85.5  --   --  84.3 83.4 82.5  PLT 177  --   --  175 184 146*  Basic Metabolic Panel: Recent Labs  Lab 07/27/2020 1517 07/19/20 0203 07/20/20 0924 07/21/20 0306 07/22/20 0128  NA 131* 134* 138 139 142  K 4.8 4.3 3.9 3.7 3.5  CL 107 107 111 113* 117*  CO2 13* 15* 15* 16* 16*  GLUCOSE 102* 117* 136* 101* 97  BUN 43* 44* 53* 53* 51*  CREATININE 3.04* 2.96* 2.50* 2.38* 2.16*  CALCIUM 8.8* 9.1 9.7 9.6 9.6  MG  --   --  1.8  --   --   PHOS  --   --  3.6  --   --    GFR: Estimated Creatinine Clearance: 25.6 mL/min (A) (by C-G formula based on SCr of 2.16 mg/dL (H)). Liver Function Tests: Recent Labs  Lab 08/02/2020 0940  AST 44*  ALT 14  ALKPHOS 69  BILITOT 1.8*  PROT 6.1*  ALBUMIN 2.5*   No results for input(s): LIPASE, AMYLASE in the last 168 hours. No results for input(s): AMMONIA in the last 168 hours. Coagulation  Profile: Recent Labs  Lab 08/07/2020 0940  INR 1.6*   Cardiac Enzymes: No results for input(s): CKTOTAL, CKMB, CKMBINDEX, TROPONINI in the last 168 hours. BNP (last 3 results) No results for input(s): PROBNP in the last 8760 hours. HbA1C: No results for input(s): HGBA1C in the last 72 hours. CBG: Recent Labs  Lab 07/29/2020 1654  GLUCAP 91   Lipid Profile: No results for input(s): CHOL, HDL, LDLCALC, TRIG, CHOLHDL, LDLDIRECT in the last 72 hours. Thyroid Function Tests: No results for input(s): TSH, T4TOTAL, FREET4, T3FREE, THYROIDAB in the last 72 hours. Anemia Panel: No results for input(s): VITAMINB12, FOLATE, FERRITIN, TIBC, IRON, RETICCTPCT in the last 72 hours. Urine analysis:    Component Value Date/Time   COLORURINE YELLOW 12/13/2013 [place] 12/13/2013 0851   LABSPEC <=1.005 (A) 12/13/2013 0851   PHURINE 5.5 12/13/2013 0851   GLUCOSEU NEGATIVE 12/13/2013 0851   HGBUR SMALL (A) 12/13/2013 0851   BILIRUBINUR neg 03/03/2016 1531   KETONESUR NEGATIVE 12/13/2013 0851   PROTEINUR '100mg'$ /ml 03/03/2016 1531   UROBILINOGEN 0.2 03/03/2016 1531   UROBILINOGEN 0.2 12/13/2013 0851   NITRITE neg 03/03/2016 1531   NITRITE NEGATIVE 12/13/2013 0851   LEUKOCYTESUR large (3+) (A) 03/03/2016 1531   Sepsis Labs: '@LABRCNTIP'$ (procalcitonin:4,lacticidven:4)  ) Recent Results (from the past 240 hour(s))  Blood Culture (routine x 2)     Status: Abnormal   Collection Time: 07/29/2020  9:40 AM   Specimen: BLOOD RIGHT ARM  Result Value Ref Range Status   Specimen Description BLOOD RIGHT ARM  Final   Special Requests   Final    BOTTLES DRAWN AEROBIC AND ANAEROBIC Blood Culture adequate volume   Culture  Setup Time   Final    GRAM POSITIVE COCCI IN CLUSTERS IN BOTH AEROBIC AND ANAEROBIC BOTTLES Organism ID to follow CRITICAL VALUE NOTED.  VALUE IS CONSISTENT WITH PREVIOUSLY REPORTED AND CALLED VALUE.    Culture (A)  Final    STAPHYLOCOCCUS AUREUS SUSCEPTIBILITIES  PERFORMED ON PREVIOUS CULTURE WITHIN THE LAST 5 DAYS. Performed at [facility], [place] [address] [place],  [address]    Report Status 07/21/2020 FINAL  Final  Blood Culture (routine x 2)     Status: Abnormal   Collection Time: 07/28/2020  9:50 AM   Specimen: BLOOD RIGHT HAND  Result Value Ref Range Status   Specimen Description BLOOD RIGHT HAND  Final   Special Requests   Final    BOTTLES DRAWN AEROBIC AND ANAEROBIC  Blood Culture results may not be optimal due to an inadequate volume of blood received in culture bottles   Culture  Setup Time   Final    GRAM POSITIVE COCCI IN CLUSTERS IN BOTH AEROBIC AND ANAEROBIC BOTTLES Organism ID to follow CRITICAL RESULT CALLED TO, READ BACK BY AND VERIFIED WITH: PHARMD LAURA SEAY BY MESSAN H. AT [id number] ON 07/27/2020 Performed at [facility], [place] [address], [place], [place] [address]    Culture STAPHYLOCOCCUS AUREUS (A)  Final   Report Status 07/21/2020 FINAL  Final   Organism ID, Bacteria STAPHYLOCOCCUS AUREUS  Final      Susceptibility   Staphylococcus aureus - MIC*    CIPROFLOXACIN <=0.5 SENSITIVE Sensitive     ERYTHROMYCIN <=0.25 SENSITIVE Sensitive     GENTAMICIN <=0.5 SENSITIVE Sensitive     OXACILLIN <=0.25 SENSITIVE Sensitive     TETRACYCLINE <=1 SENSITIVE Sensitive     VANCOMYCIN <=0.5 SENSITIVE Sensitive     TRIMETH/SULFA <=10 SENSITIVE Sensitive     CLINDAMYCIN <=0.25 SENSITIVE Sensitive     RIFAMPIN <=0.5 SENSITIVE Sensitive     Inducible Clindamycin NEGATIVE Sensitive     * STAPHYLOCOCCUS AUREUS  Blood Culture ID Panel (Reflexed)     Status: Abnormal   Collection Time: 07/19/2020  9:50 AM  Result Value Ref Range Status   Enterococcus faecalis NOT DETECTED NOT DETECTED Final   Enterococcus Faecium NOT DETECTED NOT DETECTED Final   Listeria monocytogenes NOT DETECTED NOT DETECTED Final   Staphylococcus species DETECTED (A) NOT DETECTED Final    Comment: CRITICAL RESULT CALLED TO, READ BACK BY AND VERIFIED  WITH: PHARMD LAURA SEAY BY MESSAN H. AT 0244 ON 08/03/2020    Staphylococcus aureus (BCID) DETECTED (A) NOT DETECTED Final    Comment: CRITICAL RESULT CALLED TO, READ BACK BY AND VERIFIED WITH: PHARMD LAURA SEAY BY MESSAN H. AT 0244 ON 08/08/2020    Staphylococcus epidermidis NOT DETECTED NOT DETECTED Final   Staphylococcus lugdunensis NOT DETECTED NOT DETECTED Final   Streptococcus species NOT DETECTED NOT DETECTED Final   Streptococcus agalactiae NOT DETECTED NOT DETECTED Final   Streptococcus pneumoniae NOT DETECTED NOT DETECTED Final   Streptococcus pyogenes NOT DETECTED NOT DETECTED Final   A.calcoaceticus-baumannii NOT DETECTED NOT DETECTED Final   Bacteroides fragilis NOT DETECTED NOT DETECTED Final   Enterobacterales NOT DETECTED NOT DETECTED Final   Enterobacter cloacae complex NOT DETECTED NOT DETECTED Final   Escherichia coli NOT DETECTED NOT DETECTED Final   Klebsiella aerogenes NOT DETECTED NOT DETECTED Final   Klebsiella oxytoca NOT DETECTED NOT DETECTED Final   Klebsiella pneumoniae NOT DETECTED NOT DETECTED Final   Proteus species NOT DETECTED NOT DETECTED Final   Salmonella species NOT DETECTED NOT DETECTED Final   Serratia marcescens NOT DETECTED NOT DETECTED Final   Haemophilus influenzae NOT DETECTED NOT DETECTED Final   Neisseria meningitidis NOT DETECTED NOT DETECTED Final   Pseudomonas aeruginosa NOT DETECTED NOT DETECTED Final   Stenotrophomonas maltophilia NOT DETECTED NOT DETECTED Final   Candida albicans NOT DETECTED NOT DETECTED Final   Candida auris NOT DETECTED NOT DETECTED Final   Candida glabrata NOT DETECTED NOT DETECTED Final   Candida krusei NOT DETECTED NOT DETECTED Final   Candida parapsilosis NOT DETECTED NOT DETECTED Final   Candida tropicalis NOT DETECTED NOT DETECTED Final   Cryptococcus neoformans/gattii NOT DETECTED NOT DETECTED Final   Meth resistant mecA/C and MREJ NOT DETECTED NOT DETECTED Final    Comment: Performed at [facility]  Lab, [address] [place], [place] [address]  Resp Panel by RT-PCR (Flu A&B, Covid) Nasopharyngeal Swab     Status: None   Collection Time: 07/28/2020 10:31 AM   Specimen: Nasopharyngeal Swab; Nasopharyngeal(NP) swabs in vial transport medium  Result Value Ref Range Status   SARS Coronavirus 2 by RT PCR NEGATIVE NEGATIVE Final    Comment: (NOTE) SARS-CoV-2 target nucleic acids are NOT DETECTED.  The SARS-CoV-2 RNA is generally detectable in upper respiratory specimens during the acute phase of infection. The lowest concentration of SARS-CoV-2 viral copies this assay can detect is 138 copies/mL. A negative result does not preclude SARS-Cov-2 infection and should not be used as the sole basis for treatment or other patient management decisions. A negative result may occur with  improper specimen collection/handling, submission of specimen other than nasopharyngeal swab, presence of viral mutation(s) within the areas targeted by this assay, and inadequate number of viral copies(<138 copies/mL). A negative result must be combined with clinical observations, patient history, and epidemiological information. The expected result is Negative.  Fact Sheet for Patients:  EntrepreneurPulse.com.au  Fact Sheet for Healthcare Providers:  IncredibleEmployment.be  This test is no t yet approved or cleared by the [place] FDA and  has been authorized for detection and/or diagnosis of SARS-CoV-2 by FDA under an Emergency Use Authorization (EUA). This EUA will remain  in effect (meaning this test can be used) for the duration of the COVID-19 declaration under Section 564(b)(1) of the Act, 21 U.S.C.section 360bbb-3(b)(1), unless the authorization is terminated  or revoked sooner.       Influenza A by PCR NEGATIVE NEGATIVE Final   Influenza B by PCR NEGATIVE NEGATIVE Final    Comment: (NOTE) The Xpert Xpress SARS-CoV-2/FLU/RSV plus assay is intended as an  aid in the diagnosis of influenza from Nasopharyngeal swab specimens and should not be used as a sole basis for treatment. Nasal washings and aspirates are unacceptable for Xpert Xpress SARS-CoV-2/FLU/RSV testing.  Fact Sheet for Patients: EntrepreneurPulse.com.au  Fact Sheet for Healthcare Providers: IncredibleEmployment.be  This test is not yet approved or cleared by the [place] FDA and has been authorized for detection and/or diagnosis of SARS-CoV-2 by FDA under an Emergency Use Authorization (EUA). This EUA will remain in effect (meaning this test can be used) for the duration of the COVID-19 declaration under Section 564(b)(1) of the Act, 21 U.S.C. section 360bbb-3(b)(1), unless the authorization is terminated or revoked.  Performed at [facility], [place] [address], [place], [place] [address]   Culture, blood (routine x 2)     Status: None (Preliminary result)   Collection Time: 07/20/20  9:22 AM   Specimen: BLOOD  Result Value Ref Range Status   Specimen Description BLOOD LEFT ANTECUBITAL  Final   Special Requests   Final    BOTTLES DRAWN AEROBIC AND ANAEROBIC Blood Culture results may not be optimal due to an inadequate volume of blood received in culture bottles   Culture   Final    NO GROWTH < 24 HOURS Performed at [facility], [place] [address] [place], [place] [address]    Report Status PENDING  Incomplete  Culture, blood (routine x 2)     Status: None (Preliminary result)   Collection Time: 07/20/20  9:24 AM   Specimen: BLOOD  Result Value Ref Range Status   Specimen Description BLOOD RIGHT ANTECUBITAL  Final   Special Requests   Final    BOTTLES DRAWN AEROBIC AND ANAEROBIC Blood Culture results may  not be optimal due to an inadequate volume of blood received in culture bottles   Culture   Final    NO GROWTH < 24 HOURS Performed at [place] [address] [place], [place] [address]    Report  Status PENDING  Incomplete      Studies: No results found.  Scheduled Meds: . amitriptyline  50 mg Oral QHS  . busPIRone  5 mg Oral q1800  . ezetimibe  10 mg Oral Daily  . feeding supplement  237 mL Oral BID BM  . metoprolol succinate  50 mg Oral Daily  . multivitamin with minerals  1 tablet Oral Daily  . tamsulosin  0.4 mg Oral Daily    Continuous Infusions: .  ceFAZolin (ANCEF) IV 2 g (07/22/20 0926)     LOS: 4 days     Kayleen Memos, MD [facility] Pager [phone]  If 7PM-7AM, please contact night-coverage [email or web address] Password [facility] 07/22/2020, 3:22 PM

## 2020-07-22 NOTE — Progress Notes (Signed)
Y[place] . Due to H.R. At. Flutter 110's Patient is restless trying to take mittens off. When someone is in room his H.R> comes dow to 90's ;ow 100's  Did not do protocol for  Yellow.

## 2020-07-23 DIAGNOSIS — R410 Disorientation, unspecified: Secondary | ICD-10-CM | POA: Diagnosis not present

## 2020-07-23 DIAGNOSIS — N184 Chronic kidney disease, stage 4 (severe): Secondary | ICD-10-CM | POA: Diagnosis not present

## 2020-07-23 DIAGNOSIS — R7881 Bacteremia: Secondary | ICD-10-CM | POA: Diagnosis not present

## 2020-07-23 DIAGNOSIS — J9601 Acute respiratory failure with hypoxia: Secondary | ICD-10-CM | POA: Diagnosis not present

## 2020-07-23 LAB — CBC WITH DIFFERENTIAL/PLATELET
Abs Immature Granulocytes: 0.11 10*3/uL — ABNORMAL HIGH (ref 0.00–0.07)
Basophils Absolute: 0 10*3/uL (ref 0.0–0.1)
Basophils Relative: 0 %
Eosinophils Absolute: 0 10*3/uL (ref 0.0–0.5)
Eosinophils Relative: 0 %
HCT: 39.9 % (ref 39.0–52.0)
Hemoglobin: 13.3 g/dL (ref 13.0–17.0)
Immature Granulocytes: 1 %
Lymphocytes Relative: 5 %
Lymphs Abs: 0.5 10*3/uL — ABNORMAL LOW (ref 0.7–4.0)
MCH: 27.4 pg (ref 26.0–34.0)
MCHC: 33.3 g/dL (ref 30.0–36.0)
MCV: 82.1 fL (ref 80.0–100.0)
Monocytes Absolute: 0.8 10*3/uL (ref 0.1–1.0)
Monocytes Relative: 8 %
Neutro Abs: 8 10*3/uL — ABNORMAL HIGH (ref 1.7–7.7)
Neutrophils Relative %: 86 %
Platelets: 132 10*3/uL — ABNORMAL LOW (ref 150–400)
RBC: 4.86 MIL/uL (ref 4.22–5.81)
RDW: 16.7 % — ABNORMAL HIGH (ref 11.5–15.5)
WBC: 9.3 10*3/uL (ref 4.0–10.5)
nRBC: 0 % (ref 0.0–0.2)

## 2020-07-23 NOTE — Progress Notes (Signed)
[facility]  Date of Admission:  07/28/2020     Total days of antibiotics 6         ASSESSMENT:  Mr. Nauss blood cultures from 4/4 remain without growth to date. Source of infection remains unclear. Appears to be tolerating antibiotics with no adverse side effects. Family reports he is close to his previous baseline. Continue current dose of Cefazolin. PICC line placement tomorrow if cultures remain negative. Plan is for 6 weeks of Cefazolin with end date of 5/16.   PLAN:  1. Continue cefazolin.  2. Monitor blood cultures for clearance of bacteremia. 3. PICC line tomorrow if blood cultures remain clear.   Principal Problem:   Acute respiratory failure with hypoxia (HCC) Active Problems:   Hyperlipidemia   Subclinical hypothyroidism   Sepsis ([place])   Lactic acidosis   Community acquired pneumonia   Pulmonary vascular congestion   Severe aortic stenosis   Atrial flutter (HCC)   Hyponatremia   Acute renal failure superimposed on stage 4 chronic kidney disease (HCC)   History of anemia due to CKD   Primary hypertension   BPH (benign prostatic hyperplasia)   Fall at home, initial encounter   History of gout   Bacteremia due to methicillin susceptible Staphylococcus aureus (MSSA)   Protein-calorie malnutrition, severe   . amitriptyline  50 mg Oral QHS  . ezetimibe  10 mg Oral Daily  . feeding supplement  237 mL Oral BID BM  . metoprolol succinate  50 mg Oral Daily  . multivitamin with minerals  1 tablet Oral Daily  . tamsulosin  0.4 mg Oral Daily    SUBJECTIVE:  Afebrile overnight with no acute events. Still remains awake and not sleeping at night. Ate breakfast this morning.   No Known Allergies   Review of Systems: Review of Systems  Constitutional: Negative for chills, fever and weight loss.  Respiratory: Negative for cough, shortness of breath and wheezing.   Cardiovascular: Negative for chest pain and leg swelling.   Gastrointestinal: Negative for abdominal pain, constipation, diarrhea, nausea and vomiting.  Skin: Negative for rash.      OBJECTIVE: Vitals:   08/08/2020 1256 07/23/20 0412 07/23/20 0740 07/23/20 1203  BP:  125/61 133/71 (!) 111/55  Pulse:  88 89 84  Resp:  '16 18 18  '$ Temp:  98 F (36.7 C) 98.6 F (37 C) 98.5 F (36.9 C)  TempSrc:  Oral Oral Axillary  SpO2:  100% 100% 99%  Weight: 75.1 kg     Height: '5\' 11"'$  (1.803 m)      Body mass index is 23.09 kg/m.  Physical Exam Constitutional:      General: He is not in acute distress.    Appearance: He is well-developed.     Comments: Lying in bed with head of bed elevated watching TV.   Cardiovascular:     Rate and Rhythm: Normal rate and regular rhythm.     Heart sounds: Normal heart sounds.  Pulmonary:     Effort: Pulmonary effort is normal.     Breath sounds: Normal breath sounds.  Skin:    General: Skin is warm and dry.  Neurological:     Mental Status: He is alert.     Lab Results Lab Results  Component Value Date   WBC 9.3 07/23/2020   HGB 13.3 07/23/2020   HCT 39.9 07/23/2020   MCV 82.1 07/23/2020   PLT 132 (L) 07/23/2020    Lab Results  Component Value  Date   CREATININE 2.16 (H) 07/22/2020   BUN 51 (H) 07/22/2020   NA 142 07/22/2020   K 3.5 07/22/2020   CL 117 (H) 07/22/2020   CO2 16 (L) 07/22/2020    Lab Results  Component Value Date   ALT 14 07/26/2020   AST 44 (H) 08/01/2020   ALKPHOS 69 07/31/2020   BILITOT 1.8 (H) 08/05/2020     Microbiology: Recent Results (from the past 240 hour(s))  Blood Culture (routine x 2)     Status: Abnormal   Collection Time: 08/13/2020  9:40 AM   Specimen: BLOOD RIGHT ARM  Result Value Ref Range Status   Specimen Description BLOOD RIGHT ARM  Final   Special Requests   Final    BOTTLES DRAWN AEROBIC AND ANAEROBIC Blood Culture adequate volume   Culture  Setup Time   Final    GRAM POSITIVE COCCI IN CLUSTERS IN BOTH AEROBIC AND ANAEROBIC BOTTLES Organism ID to  follow CRITICAL VALUE NOTED.  VALUE IS CONSISTENT WITH PREVIOUSLY REPORTED AND CALLED VALUE.    Culture (A)  Final    STAPHYLOCOCCUS AUREUS SUSCEPTIBILITIES PERFORMED ON PREVIOUS CULTURE WITHIN THE LAST 5 DAYS. Performed at [facility], [place] [address], [place], [place] [address]    Report Status 07/21/2020 FINAL  Final  Blood Culture (routine x 2)     Status: Abnormal   Collection Time: 07/27/2020  9:50 AM   Specimen: BLOOD RIGHT HAND  Result Value Ref Range Status   Specimen Description BLOOD RIGHT HAND  Final   Special Requests   Final    BOTTLES DRAWN AEROBIC AND ANAEROBIC Blood Culture results may not be optimal due to an inadequate volume of blood received in culture bottles   Culture  Setup Time   Final    GRAM POSITIVE COCCI IN CLUSTERS IN BOTH AEROBIC AND ANAEROBIC BOTTLES Organism ID to follow CRITICAL RESULT CALLED TO, READ BACK BY AND VERIFIED WITH: PHARMD LAURA SEAY BY MESSAN H. AT [id number] ON 08/07/2020 Performed at [facility], [place] [address] [place], [place] [address]    Culture STAPHYLOCOCCUS AUREUS (A)  Final   Report Status 07/21/2020 FINAL  Final   Organism ID, Bacteria STAPHYLOCOCCUS AUREUS  Final      Susceptibility   Staphylococcus aureus - MIC*    CIPROFLOXACIN <=0.5 SENSITIVE Sensitive     ERYTHROMYCIN <=0.25 SENSITIVE Sensitive     GENTAMICIN <=0.5 SENSITIVE Sensitive     OXACILLIN <=0.25 SENSITIVE Sensitive     TETRACYCLINE <=1 SENSITIVE Sensitive     VANCOMYCIN <=0.5 SENSITIVE Sensitive     TRIMETH/SULFA <=10 SENSITIVE Sensitive     CLINDAMYCIN <=0.25 SENSITIVE Sensitive     RIFAMPIN <=0.5 SENSITIVE Sensitive     Inducible Clindamycin NEGATIVE Sensitive     * STAPHYLOCOCCUS AUREUS  Blood Culture ID Panel (Reflexed)     Status: Abnormal   Collection Time: 08/07/2020  9:50 AM  Result Value Ref Range Status   Enterococcus faecalis NOT DETECTED NOT DETECTED Final   Enterococcus Faecium NOT DETECTED NOT DETECTED Final   Listeria monocytogenes NOT  DETECTED NOT DETECTED Final   Staphylococcus species DETECTED (A) NOT DETECTED Final    Comment: CRITICAL RESULT CALLED TO, READ BACK BY AND VERIFIED WITH: PHARMD LAURA SEAY BY MESSAN H. AT [id number] ON 08/12/2020    Staphylococcus aureus (BCID) DETECTED (A) NOT DETECTED Final    Comment: CRITICAL RESULT CALLED TO, READ BACK BY AND VERIFIED WITH: PHARMD LAURA SEAY BY MESSAN H. AT  [id number] ON 07/29/2020    Staphylococcus epidermidis NOT DETECTED NOT DETECTED Final   Staphylococcus lugdunensis NOT DETECTED NOT DETECTED Final   Streptococcus species NOT DETECTED NOT DETECTED Final   Streptococcus agalactiae NOT DETECTED NOT DETECTED Final   Streptococcus pneumoniae NOT DETECTED NOT DETECTED Final   Streptococcus pyogenes NOT DETECTED NOT DETECTED Final   A.calcoaceticus-baumannii NOT DETECTED NOT DETECTED Final   Bacteroides fragilis NOT DETECTED NOT DETECTED Final   Enterobacterales NOT DETECTED NOT DETECTED Final   Enterobacter cloacae complex NOT DETECTED NOT DETECTED Final   Escherichia coli NOT DETECTED NOT DETECTED Final   Klebsiella aerogenes NOT DETECTED NOT DETECTED Final   Klebsiella oxytoca NOT DETECTED NOT DETECTED Final   Klebsiella pneumoniae NOT DETECTED NOT DETECTED Final   Proteus species NOT DETECTED NOT DETECTED Final   Salmonella species NOT DETECTED NOT DETECTED Final   Serratia marcescens NOT DETECTED NOT DETECTED Final   Haemophilus influenzae NOT DETECTED NOT DETECTED Final   Neisseria meningitidis NOT DETECTED NOT DETECTED Final   Pseudomonas aeruginosa NOT DETECTED NOT DETECTED Final   Stenotrophomonas maltophilia NOT DETECTED NOT DETECTED Final   Candida albicans NOT DETECTED NOT DETECTED Final   Candida auris NOT DETECTED NOT DETECTED Final   Candida glabrata NOT DETECTED NOT DETECTED Final   Candida krusei NOT DETECTED NOT DETECTED Final   Candida parapsilosis NOT DETECTED NOT DETECTED Final   Candida tropicalis NOT DETECTED NOT DETECTED Final   Cryptococcus  neoformans/gattii NOT DETECTED NOT DETECTED Final   Meth resistant mecA/C and MREJ NOT DETECTED NOT DETECTED Final    Comment: Performed at [facility], [address] [place], [place] [address]  Resp Panel by RT-PCR (Flu A&B, Covid) Nasopharyngeal Swab     Status: None   Collection Time: 07/19/2020 10:31 AM   Specimen: Nasopharyngeal Swab; Nasopharyngeal(NP) swabs in vial transport medium  Result Value Ref Range Status   SARS Coronavirus 2 by RT PCR NEGATIVE NEGATIVE Final    Comment: (NOTE) SARS-CoV-2 target nucleic acids are NOT DETECTED.  The SARS-CoV-2 RNA is generally detectable in upper respiratory specimens during the acute phase of infection. The lowest concentration of SARS-CoV-2 viral copies this assay can detect is 138 copies/mL. A negative result does not preclude SARS-Cov-2 infection and should not be used as the sole basis for treatment or other patient management decisions. A negative result may occur with  improper specimen collection/handling, submission of specimen other than nasopharyngeal swab, presence of viral mutation(s) within the areas targeted by this assay, and inadequate number of viral copies(<138 copies/mL). A negative result must be combined with clinical observations, patient history, and epidemiological information. The expected result is Negative.  Fact Sheet for Patients:  EntrepreneurPulse.com.au  Fact Sheet for Healthcare Providers:  IncredibleEmployment.be  This test is no t yet approved or cleared by the [place] FDA and  has been authorized for detection and/or diagnosis of SARS-CoV-2 by FDA under an Emergency Use Authorization (EUA). This EUA will remain  in effect (meaning this test can be used) for the duration of the COVID-19 declaration under Section 564(b)(1) of the Act, 21 U.S.C.section 360bbb-3(b)(1), unless the authorization is terminated  or revoked sooner.       Influenza A by PCR  NEGATIVE NEGATIVE Final   Influenza B by PCR NEGATIVE NEGATIVE Final    Comment: (NOTE) The Xpert Xpress SARS-CoV-2/FLU/RSV plus assay is intended as an aid in the diagnosis of influenza from Nasopharyngeal swab specimens and should not be used as a sole  basis for treatment. Nasal washings and aspirates are unacceptable for Xpert Xpress SARS-CoV-2/FLU/RSV testing.  Fact Sheet for Patients: EntrepreneurPulse.com.au  Fact Sheet for Healthcare Providers: IncredibleEmployment.be  This test is not yet approved or cleared by the [place] FDA and has been authorized for detection and/or diagnosis of SARS-CoV-2 by FDA under an Emergency Use Authorization (EUA). This EUA will remain in effect (meaning this test can be used) for the duration of the COVID-19 declaration under Section 564(b)(1) of the Act, 21 U.S.C. section 360bbb-3(b)(1), unless the authorization is terminated or revoked.  Performed at [facility], [place] [address] [place], [place] [address]   Culture, blood (routine x 2)     Status: None (Preliminary result)   Collection Time: 07/20/20  9:22 AM   Specimen: BLOOD  Result Value Ref Range Status   Specimen Description BLOOD LEFT ANTECUBITAL  Final   Special Requests   Final    BOTTLES DRAWN AEROBIC AND ANAEROBIC Blood Culture results may not be optimal due to an inadequate volume of blood received in culture bottles   Culture   Final    NO GROWTH 3 DAYS Performed at [facility], [place] [address] [place], [place] [address]    Report Status PENDING  Incomplete  Culture, blood (routine x 2)     Status: None (Preliminary result)   Collection Time: 07/20/20  9:24 AM   Specimen: BLOOD  Result Value Ref Range Status   Specimen Description BLOOD RIGHT ANTECUBITAL  Final   Special Requests   Final    BOTTLES DRAWN AEROBIC AND ANAEROBIC Blood Culture results may not be optimal due to an inadequate volume of blood received in  culture bottles   Culture   Final    NO GROWTH 3 DAYS Performed at [facility], [place] [address], [place], [place] [address]    Report Status PENDING  Incomplete     Terri Piedra, [place] [facility]  07/23/2020  2:21 PM

## 2020-07-23 NOTE — Care Management Important Message (Signed)
Important Message  Patient Details  Name: Richard Sullivan MRN: [id number] Date of Birth: December 27, 1930   Medicare Important Message Given:  Yes     Shelda Altes 07/23/2020, 11:20 AM

## 2020-07-23 NOTE — Progress Notes (Addendum)
PHARMACY CONSULT NOTE FOR:  OUTPATIENT  PARENTERAL ANTIBIOTIC THERAPY (OPAT)  Indication: cefazolin 2g IV every 12 hours Regimen: MSSA bacteremia  End date: 08/31/2020  IV antibiotic discharge orders are pended. To discharging provider:  please sign these orders via discharge navigator,  Select New Orders & click on the button choice - Manage This Unsigned Work.     Thank you for allowing pharmacy to be a part of this patient's care.  Mercy Riding, PharmD PGY1 Acute Care Pharmacy Resident

## 2020-07-23 NOTE — Progress Notes (Signed)
PROGRESS NOTE  Richard Sullivan [id number] DOB: 06/13/1930 DOA: 07/19/2020 PCP: Tammi Sou, MD  HPI/Recap of past 59 hours: 87 year old gentleman with history of severe aortic stenosis on conservative management not amenable to surgery, paroxysmal a flutter on Eliquis, hypertension, hyperlipidemia, chronic kidney disease stage IV, anemia of chronic disease, hypothyroidism, gout and depression presented with shortness of breath, cough and generalized weakness for about 3 days.  Not feeling well for about 1 week, associated with a nonproductive cough.  Symptoms worse for last 24 hours prior to his presentation.  His son called EMS.  Patient lives at home and his son lives next door.  In the emergency room, temperature one 101.4.  Heart rate 104.  Respiratory 26.  Lactic acid 2.6.  Covid and influenza negative.  Chest x-ray with mild cardiomegaly, pulmonary vascular congestion and interstitial opacities.  He was subsequently found to have AKI, MSSA bacteremia.  Primary source unknown. Skeletal survey in the ER with CT scan of the cervical spine was normal. Head CT showed bilateral hygroma, thought to be chronic subdural.  Patient has flat affect and difficult to assess mental status at many times.  Remains in the hospital with MSSA bacteremia and on active treatment.  Has had episodes of confusion, however easily reoriented.  Seen by ID, not a good candidate for TEE.  Plan to treat for a full 6 weeks with IV antibiotics.  Likely will have PICC line placed on 07/23/2020 if repeat blood cultures remain negative.  07/23/20: Patient was seen and examined with his son present at bedside.  More alert and a little more interactive.  Assessment/Plan: Principal Problem:   Acute respiratory failure with hypoxia (HCC) Active Problems:   Hyperlipidemia   Subclinical hypothyroidism   Sepsis ([place])   Lactic acidosis   Community acquired pneumonia   Pulmonary vascular congestion   Severe aortic  stenosis   Atrial flutter (HCC)   Hyponatremia   Acute renal failure superimposed on stage 4 chronic kidney disease (HCC)   History of anemia due to CKD   Primary hypertension   BPH (benign prostatic hyperplasia)   Fall at home, initial encounter   History of gout   Bacteremia due to methicillin susceptible Staphylococcus aureus (MSSA)   Protein-calorie malnutrition, severe  Severe Sepsis, resolving, present on admission secondary MSSA bacteremia, primary source unknown, possible pneumonia  Patient adequately resuscitated.  Blood pressures fairly stable.  Lactic acid normalized. He was given very conservative IV fluids with severe aortic stenosis and pulmonary edema present. Remains on Ancef.  Followed by ID.   Blood cultures 4/2, MSSA Blood cultures 4/4, no growth to date. 2D echocardiogram with severe aortic stenosis , no evidence of endocarditis.   Patient is not stable enough to go for TEE.  Anticipate prolonged antibiotic therapy, x6 weeks. Plan for PICC line placement on 07/23/2020 if blood cultures remain negative per ID.   Currently afebrile with no leukocytosis.  Dysphagia He is currently on dysphagia 1 diet He is tolerating well Continue aspiration precautions.  Resolved acute hypoxic respiratory failure secondary to atypical pneumonia versus septic pulmonary emboli Not on oxygen supplementation at baseline Currently on room air with O2 saturation of 97%.  Resolved nonoliguric AKI on chronic kidney disease stage IV:  Baseline creatinine appears to be 2.1 with GFR of 29.   He is currently back to his baseline renal function. Presented with creatinine more than 3.  Has received gentle IV fluid hydration. Currently off IV fluid in the setting of  severe aortic stenosis Repeat renal panel in the morning. Continue to monitor urine output.  History of severe aortic stenosis, a flutter on Eliquis:  Heart rate is fluctuating Continue Toprol-XL 50 mg daily.   Continue to  closely monitor on telemetry.   Avoid IV fluid as able in the setting of severe aortic stenosis.  Improving acute metabolic encephalopathy in the setting of bilateral subdural hematoma, thought to be chronic, evaluated by neurosurgery. His mentation has improved. Continue to reorient as needed.  Goals of care Currently DNR Seen by palliative care team.  Essential hypertension: BP is currently at goal He is on Toprol-XL 50 mg daily Continue to monitor vital signs.  BPH Continue home Flomax  Hyperlipidemia Continue home Zetia   Chronic depression Continue home amitriptyline  Bilateral subdural hematoma/hygroma:   Dr. Barb Merino discussed with neurosurgery.  They thought this is chronic. If absolutely needed, neurosurgery is okay with continuing Eliquis.  Patient with poor recovery and unknown outcome, will discontinue Eliquis to decrease incidence of bleeding.  Acute thrombocytopenia Mild drop in platelet count 132 from 146 Continue to monitor   DVT prophylaxis: SCDs Start: 08/05/2020 1412 SCDs Start: 07/17/2020 1407   Code Status: DNR Family Communication: Patient's son at the bedside 4/7.   Disposition Plan: Status is: Inpatient  Remains inpatient appropriate because:Hemodynamically unstable, IV treatments appropriate due to intensity of illness or inability to take PO and Inpatient level of care appropriate due to severity of illness   Dispo: The patient is from: Home  Anticipated d/c is to: Home with home health services.  Patient currently is not medically stable to d/c.              Difficult to place patient not applicable.         Consultants:   Palliative care  Infectious disease  Procedures:   None       Objective: Vitals:   07/19/2020 1256 07/23/20 0412 07/23/20 0740 07/23/20 1203  BP:  125/61 133/71 (!) 111/55  Pulse:  88 89 84  Resp:  '16 18 18  '$ Temp:  98 F (36.7 C) 98.6 F (37 C) 98.5 F  (36.9 C)  TempSrc:  Oral Oral Axillary  SpO2:  100% 100% 99%  Weight: 75.1 kg     Height: '5\' 11"'$  (1.803 m)       Intake/Output Summary (Last 24 hours) at 07/23/2020 1551 Last data filed at 07/23/2020 1204 Gross per 24 hour  Intake 600 ml  Output 900 ml  Net -300 ml   Filed Weights   08/03/2020 1020 07/23/2020 1256  Weight: 75.1 kg 75.1 kg    Exam:  . General: 87 y.o. year-old male chronically ill-appearing no acute stress.  He is alert and more interactive today.   . Cardiovascular: Regular rate and rhythm no rubs or gallops.   Marland Kitchen Respiratory: Mild rales at bases no wheezing noted.  Poor inspiratory effort. . Abdomen: Soft nontender normal bowel sounds noted.   . Musculoskeletal: No lower extremity edema bilaterally. . Skin: No ulcerative lesions noted. Marland Kitchen Psychiatry: Mood is appropriate for condition and setting.   Data Reviewed: CBC: Recent Labs  Lab 08/15/2020 0940 08/03/2020 1047 07/23/2020 1339 07/19/20 0203 07/20/20 0924 07/22/20 0128 07/23/20 0152  WBC 12.1*  --   --  12.0* 13.5* 10.5 9.3  NEUTROABS 11.2*  --   --  11.4* 12.8* 9.3* 8.0*  HGB 13.0   < > 10.9* 12.6* 13.6 14.2 13.3  HCT 39.6   < >  32.0* 38.1* 40.3 42.9 39.9  MCV 85.5  --   --  84.3 83.4 82.5 82.1  PLT 177  --   --  175 184 146* 132*   < > = values in this interval not displayed.   Basic Metabolic Panel: Recent Labs  Lab 07/17/2020 1517 07/19/20 0203 07/20/20 0924 07/21/20 0306 07/22/20 0128  NA 131* 134* 138 139 142  K 4.8 4.3 3.9 3.7 3.5  CL 107 107 111 113* 117*  CO2 13* 15* 15* 16* 16*  GLUCOSE 102* 117* 136* 101* 97  BUN 43* 44* 53* 53* 51*  CREATININE 3.04* 2.96* 2.50* 2.38* 2.16*  CALCIUM 8.8* 9.1 9.7 9.6 9.6  MG  --   --  1.8  --   --   PHOS  --   --  3.6  --   --    GFR: Estimated Creatinine Clearance: 25.6 mL/min (A) (by C-G formula based on SCr of 2.16 mg/dL (H)). Liver Function Tests: Recent Labs  Lab 08/02/2020 0940  AST 44*  ALT 14  ALKPHOS 69  BILITOT 1.8*  PROT 6.1*   ALBUMIN 2.5*   No results for input(s): LIPASE, AMYLASE in the last 168 hours. No results for input(s): AMMONIA in the last 168 hours. Coagulation Profile: Recent Labs  Lab 08/02/2020 0940  INR 1.6*   Cardiac Enzymes: No results for input(s): CKTOTAL, CKMB, CKMBINDEX, TROPONINI in the last 168 hours. BNP (last 3 results) No results for input(s): PROBNP in the last 8760 hours. HbA1C: No results for input(s): HGBA1C in the last 72 hours. CBG: Recent Labs  Lab 07/22/2020 1654  GLUCAP 91   Lipid Profile: No results for input(s): CHOL, HDL, LDLCALC, TRIG, CHOLHDL, LDLDIRECT in the last 72 hours. Thyroid Function Tests: No results for input(s): TSH, T4TOTAL, FREET4, T3FREE, THYROIDAB in the last 72 hours. Anemia Panel: No results for input(s): VITAMINB12, FOLATE, FERRITIN, TIBC, IRON, RETICCTPCT in the last 72 hours. Urine analysis:    Component Value Date/Time   COLORURINE YELLOW 12/13/2013 0851   APPEARANCEUR CLEAR 12/13/2013 0851   LABSPEC <=1.005 (A) 12/13/2013 0851   PHURINE 5.5 12/13/2013 0851   GLUCOSEU NEGATIVE 12/13/2013 0851   HGBUR SMALL (A) 12/13/2013 0851   BILIRUBINUR neg 03/03/2016 1531   KETONESUR NEGATIVE 12/13/2013 0851   PROTEINUR '100mg'$ /ml 03/03/2016 1531   UROBILINOGEN 0.2 03/03/2016 1531   UROBILINOGEN 0.2 12/13/2013 0851   NITRITE neg 03/03/2016 1531   NITRITE NEGATIVE 12/13/2013 0851   LEUKOCYTESUR large (3+) (A) 03/03/2016 1531   Sepsis Labs: '@LABRCNTIP'$ (procalcitonin:4,lacticidven:4)  ) Recent Results (from the past 240 hour(s))  Blood Culture (routine x 2)     Status: Abnormal   Collection Time: 08/03/2020  9:40 AM   Specimen: BLOOD RIGHT ARM  Result Value Ref Range Status   Specimen Description BLOOD RIGHT ARM  Final   Special Requests   Final    BOTTLES DRAWN AEROBIC AND ANAEROBIC Blood Culture adequate volume   Culture  Setup Time   Final    GRAM POSITIVE COCCI IN CLUSTERS IN BOTH AEROBIC AND ANAEROBIC BOTTLES Organism ID to  follow CRITICAL VALUE NOTED.  VALUE IS CONSISTENT WITH PREVIOUSLY REPORTED AND CALLED VALUE.    Culture (A)  Final    STAPHYLOCOCCUS AUREUS SUSCEPTIBILITIES PERFORMED ON PREVIOUS CULTURE WITHIN THE LAST 5 DAYS. Performed at [facility], [place] [address], [place], [place] [address]    Report Status 07/21/2020 FINAL  Final  Blood Culture (routine x 2)     Status: Abnormal  Collection Time: 07/26/2020  9:50 AM   Specimen: BLOOD RIGHT HAND  Result Value Ref Range Status   Specimen Description BLOOD RIGHT HAND  Final   Special Requests   Final    BOTTLES DRAWN AEROBIC AND ANAEROBIC Blood Culture results may not be optimal due to an inadequate volume of blood received in culture bottles   Culture  Setup Time   Final    GRAM POSITIVE COCCI IN CLUSTERS IN BOTH AEROBIC AND ANAEROBIC BOTTLES Organism ID to follow CRITICAL RESULT CALLED TO, READ BACK BY AND VERIFIED WITH: PHARMD LAURA SEAY BY MESSAN H. AT [id number] ON 07/28/2020 Performed at [facility], [place] [address], [place], [place] [address]    Culture STAPHYLOCOCCUS AUREUS (A)  Final   Report Status 07/21/2020 FINAL  Final   Organism ID, Bacteria STAPHYLOCOCCUS AUREUS  Final      Susceptibility   Staphylococcus aureus - MIC*    CIPROFLOXACIN <=0.5 SENSITIVE Sensitive     ERYTHROMYCIN <=0.25 SENSITIVE Sensitive     GENTAMICIN <=0.5 SENSITIVE Sensitive     OXACILLIN <=0.25 SENSITIVE Sensitive     TETRACYCLINE <=1 SENSITIVE Sensitive     VANCOMYCIN <=0.5 SENSITIVE Sensitive     TRIMETH/SULFA <=10 SENSITIVE Sensitive     CLINDAMYCIN <=0.25 SENSITIVE Sensitive     RIFAMPIN <=0.5 SENSITIVE Sensitive     Inducible Clindamycin NEGATIVE Sensitive     * STAPHYLOCOCCUS AUREUS  Blood Culture ID Panel (Reflexed)     Status: Abnormal   Collection Time: 08/06/2020  9:50 AM  Result Value Ref Range Status   Enterococcus faecalis NOT DETECTED NOT DETECTED Final   Enterococcus Faecium NOT DETECTED NOT DETECTED Final   Listeria monocytogenes NOT  DETECTED NOT DETECTED Final   Staphylococcus species DETECTED (A) NOT DETECTED Final    Comment: CRITICAL RESULT CALLED TO, READ BACK BY AND VERIFIED WITH: PHARMD LAURA SEAY BY MESSAN H. AT 0244 ON 08/09/2020    Staphylococcus aureus (BCID) DETECTED (A) NOT DETECTED Final    Comment: CRITICAL RESULT CALLED TO, READ BACK BY AND VERIFIED WITH: PHARMD LAURA SEAY BY MESSAN H. AT 0244 ON 07/27/2020    Staphylococcus epidermidis NOT DETECTED NOT DETECTED Final   Staphylococcus lugdunensis NOT DETECTED NOT DETECTED Final   Streptococcus species NOT DETECTED NOT DETECTED Final   Streptococcus agalactiae NOT DETECTED NOT DETECTED Final   Streptococcus pneumoniae NOT DETECTED NOT DETECTED Final   Streptococcus pyogenes NOT DETECTED NOT DETECTED Final   A.calcoaceticus-baumannii NOT DETECTED NOT DETECTED Final   Bacteroides fragilis NOT DETECTED NOT DETECTED Final   Enterobacterales NOT DETECTED NOT DETECTED Final   Enterobacter cloacae complex NOT DETECTED NOT DETECTED Final   Escherichia coli NOT DETECTED NOT DETECTED Final   Klebsiella aerogenes NOT DETECTED NOT DETECTED Final   Klebsiella oxytoca NOT DETECTED NOT DETECTED Final   Klebsiella pneumoniae NOT DETECTED NOT DETECTED Final   Proteus species NOT DETECTED NOT DETECTED Final   Salmonella species NOT DETECTED NOT DETECTED Final   Serratia marcescens NOT DETECTED NOT DETECTED Final   Haemophilus influenzae NOT DETECTED NOT DETECTED Final   Neisseria meningitidis NOT DETECTED NOT DETECTED Final   Pseudomonas aeruginosa NOT DETECTED NOT DETECTED Final   Stenotrophomonas maltophilia NOT DETECTED NOT DETECTED Final   Candida albicans NOT DETECTED NOT DETECTED Final   Candida auris NOT DETECTED NOT DETECTED Final   Candida glabrata NOT DETECTED NOT DETECTED Final   Candida krusei NOT DETECTED NOT DETECTED Final   Candida parapsilosis NOT DETECTED NOT DETECTED Final  Candida tropicalis NOT DETECTED NOT DETECTED Final   Cryptococcus  neoformans/gattii NOT DETECTED NOT DETECTED Final   Meth resistant mecA/C and MREJ NOT DETECTED NOT DETECTED Final    Comment: Performed at [facility], [place] [address] [place], [place] [address]  Resp Panel by RT-PCR (Flu A&B, Covid) Nasopharyngeal Swab     Status: None   Collection Time: 08/13/2020 10:31 AM   Specimen: Nasopharyngeal Swab; Nasopharyngeal(NP) swabs in vial transport medium  Result Value Ref Range Status   SARS Coronavirus 2 by RT PCR NEGATIVE NEGATIVE Final    Comment: (NOTE) SARS-CoV-2 target nucleic acids are NOT DETECTED.  The SARS-CoV-2 RNA is generally detectable in upper respiratory specimens during the acute phase of infection. The lowest concentration of SARS-CoV-2 viral copies this assay can detect is 138 copies/mL. A negative result does not preclude SARS-Cov-2 infection and should not be used as the sole basis for treatment or other patient management decisions. A negative result may occur with  improper specimen collection/handling, submission of specimen other than nasopharyngeal swab, presence of viral mutation(s) within the areas targeted by this assay, and inadequate number of viral copies(<138 copies/mL). A negative result must be combined with clinical observations, patient history, and epidemiological information. The expected result is Negative.  Fact Sheet for Patients:  EntrepreneurPulse.com.au  Fact Sheet for Healthcare Providers:  IncredibleEmployment.be  This test is no t yet approved or cleared by the [place] FDA and  has been authorized for detection and/or diagnosis of SARS-CoV-2 by FDA under an Emergency Use Authorization (EUA). This EUA will remain  in effect (meaning this test can be used) for the duration of the COVID-19 declaration under Section 564(b)(1) of the Act, 21 U.S.C.section 360bbb-3(b)(1), unless the authorization is terminated  or revoked sooner.       Influenza A by PCR  NEGATIVE NEGATIVE Final   Influenza B by PCR NEGATIVE NEGATIVE Final    Comment: (NOTE) The Xpert Xpress SARS-CoV-2/FLU/RSV plus assay is intended as an aid in the diagnosis of influenza from Nasopharyngeal swab specimens and should not be used as a sole basis for treatment. Nasal washings and aspirates are unacceptable for Xpert Xpress SARS-CoV-2/FLU/RSV testing.  Fact Sheet for Patients: EntrepreneurPulse.com.au  Fact Sheet for Healthcare Providers: IncredibleEmployment.be  This test is not yet approved or cleared by the [place] FDA and has been authorized for detection and/or diagnosis of SARS-CoV-2 by FDA under an Emergency Use Authorization (EUA). This EUA will remain in effect (meaning this test can be used) for the duration of the COVID-19 declaration under Section 564(b)(1) of the Act, 21 U.S.C. section 360bbb-3(b)(1), unless the authorization is terminated or revoked.  Performed at [facility], [place] [address], [place], [place] [address]   Culture, blood (routine x 2)     Status: None (Preliminary result)   Collection Time: 07/20/20  9:22 AM   Specimen: BLOOD  Result Value Ref Range Status   Specimen Description BLOOD LEFT ANTECUBITAL  Final   Special Requests   Final    BOTTLES DRAWN AEROBIC AND ANAEROBIC Blood Culture results may not be optimal due to an inadequate volume of blood received in culture bottles   Culture   Final    NO GROWTH 3 DAYS Performed at [facility], [place] [address] [place], [place] [address]    Report Status PENDING  Incomplete  Culture, blood (routine x 2)     Status: None (Preliminary result)   Collection Time: 07/20/20  9:24  AM   Specimen: BLOOD  Result Value Ref Range Status   Specimen Description BLOOD RIGHT ANTECUBITAL  Final   Special Requests   Final    BOTTLES DRAWN AEROBIC AND ANAEROBIC Blood Culture results may not be optimal due to an inadequate volume of blood received in  culture bottles   Culture   Final    NO GROWTH 3 DAYS Performed at [facility], [place] [address] [place], [place] [address]    Report Status PENDING  Incomplete      Studies: No results found.  Scheduled Meds: . amitriptyline  50 mg Oral QHS  . ezetimibe  10 mg Oral Daily  . feeding supplement  237 mL Oral BID BM  . metoprolol succinate  50 mg Oral Daily  . multivitamin with minerals  1 tablet Oral Daily  . tamsulosin  0.4 mg Oral Daily    Continuous Infusions: .  ceFAZolin (ANCEF) IV 2 g (07/23/20 0942)     LOS: 5 days     Kayleen Memos, MD [facility] Pager [phone]  If 7PM-7AM, please contact night-coverage [email or web address] Password [facility] 07/23/2020, 3:51 PM

## 2020-07-23 NOTE — Plan of Care (Signed)

## 2020-07-23 NOTE — Progress Notes (Signed)
Pt was trying to get oob, mittens applied, bed alarm in place, extremely weak. offered pt some water, pt had hard time drinking with straw, seem very weak. And was coughing after sipping, gave pt water with spoon. Pt was unable to take whole pill/apple sauce. meds crushed and mixed with water to feed with spoon. Pt stated " I need some help and and when asked what can I do for him pt said why don't you just kill me." pt seems restless, staring on the wall. Will continue to monitor.

## 2020-07-23 NOTE — Progress Notes (Signed)
Physical Therapy Treatment Patient Details Name: Richard Sullivan MRN: [id number] DOB: June 09, 1930 Today's Date: 07/23/2020    History of Present Illness Pt is an 87 y.o. male admitted 08/13/2020 with SOB, cough, generalized weakness, falls. Workup for acute hypoxic respiratory failure due to CHF/fluid overload; potential for atypical PNA. 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 Pt with worsening confusion; repeat head CT 4/4 with minimal enlargement of subdural collections, consistent with minimal additional subdural bleeding, mild mass-effect, no midline shift. PMH includes severe aortic stenosis, aflutter on Eliquis, HTN, CKD IV, anemia, BPH, gout, depression.    PT Comments    Pt received in supine, confused but agreeable to therapy session and with good participation and tolerance for gait and transfer training. Pt continues to need multimodal cues for all functional mobility tasks and +1-2 assist for safety with step by step sequencing for transfers and gait. Pt with heavy posterior lean initially upon standing and at times has trouble maintaining grip on RW, making gait progression difficult, and he reports moderate (5/10 modified RPE) fatigue after session. Pt continues to benefit from PT services to progress toward functional mobility goals. Continue to recommend SNF.   Follow Up Recommendations  SNF;Supervision/Assistance - 24 hour     Equipment Recommendations  3in1 (PT);Wheelchair (measurements PT);Wheelchair cushion (measurements PT)    Recommendations for Other Services       Precautions / Restrictions Precautions Precautions: Fall;Other (comment) Precaution Comments: Bilateral soft mitts Restrictions Weight Bearing Restrictions: No    Mobility  Bed Mobility Overal bed mobility: Needs Assistance Bed Mobility: Supine to Sit;Sit to Supine     Supine to sit: Mod assist;HOB elevated Sit to supine: Min assist;+2 for  safety/equipment   General bed mobility comments: ModA for BLE management and scooting hips to EOB, minA for trnk elevation; minA for repositioning with return to supine    Transfers Overall transfer level: Needs assistance Equipment used: Rolling walker (2 wheeled) Transfers: Sit to/from [facility] Sit to Stand: Mod assist;+2 safety/equipment;From elevated surface Stand pivot transfers: Mod assist;+2 safety/equipment       General transfer comment: EOB<>RW and EOB<>recliner, pt sat in recliner while wet sheets changed then pivoted back; needs assist to manage RW and keep hands on RW  Ambulation/Gait Ambulation/Gait assistance: Mod assist;+2 safety/equipment Gait Distance (Feet): 6 Feet (x2 with seated break) Assistive device: Rolling walker (2 wheeled) Gait Pattern/deviations: Step-to pattern;Trunk flexed;Leaning posteriorly;Shuffle Gait velocity: Decreased   General Gait Details: Pt able to take side steps to/from recliner with RW and min/modA for stability, minA for RW management; max verbal cues and at times modA for stability/posture   Stairs             Wheelchair Mobility    Modified Rankin (Stroke Patients Only)       Balance Overall balance assessment: Needs assistance Sitting-balance support: Bilateral upper extremity supported;Feet supported Sitting balance-Leahy Scale: Fair     Standing balance support: Bilateral upper extremity supported;During functional activity Standing balance-Leahy Scale: Poor Standing balance comment: Reliant on UE support and external assist                            Cognition Arousal/Alertness: Awake/alert Behavior During Therapy: Restless;Flat affect Overall Cognitive Status: No family/caregiver present to determine baseline cognitive functioning Area of Impairment: Attention;Following commands;Safety/judgement;Awareness;Problem solving;Orientation;Memory                 Orientation  Level:  Disoriented to;Time;Situation Current Attention Level: Focused;Sustained Memory: Decreased short-term memory Following Commands: Follows one step commands with increased time;Follows one step commands inconsistently Safety/Judgement: Decreased awareness of deficits;Decreased awareness of safety Awareness: Intellectual;Emergent Problem Solving: Difficulty sequencing;Requires verbal cues;Slow processing;Decreased initiation General Comments: Pt confused and soft voice but able to follow ~75% of 1-step commands with increased time and multimodal cues. Appears slightly drowsy but receptive to most instructions. c/o thirst frequently and given small sips of drink.      Exercises      General Comments General comments (skin integrity, edema, etc.): VSS on RA, DOE 1-2/4 with activity      Pertinent Vitals/Pain Pain Assessment: Faces Faces Pain Scale: Hurts a little bit Pain Descriptors / Indicators: Grimacing Pain Intervention(s): Monitored during session;Repositioned    Home Living                      Prior Function            PT Goals (current goals can now be found in the care plan section) Acute Rehab PT Goals Patient Stated Goal: Regain strength; agreeable to post-acute rehab at SNF PT Goal Formulation: With patient Time For Goal Achievement: 08/02/20 Potential to Achieve Goals: Fair Progress towards PT goals: Progressing toward goals (slow progress, cognition limiting)    Frequency    Min 2X/week      PT Plan Current plan remains appropriate    Co-evaluation              AM-PAC PT "6 Clicks" Mobility   Outcome Measure  Help needed turning from your back to your side while in a flat bed without using bedrails?: A Lot Help needed moving from lying on your back to sitting on the side of a flat bed without using bedrails?: A Lot Help needed moving to and from a bed to a chair (including a wheelchair)?: A Lot Help needed standing up from a chair  using your arms (e.g., wheelchair or bedside chair)?: A Lot Help needed to walk in hospital room?: A Lot Help needed climbing 3-5 steps with a railing? : Total 6 Click Score: 11    End of Session Equipment Utilized During Treatment: Gait belt Activity Tolerance: Patient tolerated treatment well;Patient limited by fatigue Patient left: in bed;with call bell/phone within reach;with bed alarm set;with restraints reapplied (RN notified he is requesting water and needs assist to drink safely) Nurse Communication: Mobility status PT Visit Diagnosis: Other abnormalities of gait and mobility (R26.89);Muscle weakness (generalized) (M62.81)     Time: 1525-1550 PT Time Calculation (min) (ACUTE ONLY): 25 min  Charges:  $Gait Training: 8-22 mins $Therapeutic Activity: 8-22 mins                     Alyn Riedinger P., PTA Acute Rehabilitation Services Pager: [phone] Office: [place] 07/23/2020, 5:26 PM

## 2020-07-24 ENCOUNTER — Inpatient Hospital Stay: Payer: Self-pay

## 2020-07-24 ENCOUNTER — Inpatient Hospital Stay (HOSPITAL_COMMUNITY): Payer: Medicare Other

## 2020-07-24 DIAGNOSIS — J9601 Acute respiratory failure with hypoxia: Secondary | ICD-10-CM | POA: Diagnosis not present

## 2020-07-24 DIAGNOSIS — I350 Nonrheumatic aortic (valve) stenosis: Secondary | ICD-10-CM | POA: Diagnosis not present

## 2020-07-24 DIAGNOSIS — R7881 Bacteremia: Secondary | ICD-10-CM | POA: Diagnosis not present

## 2020-07-24 DIAGNOSIS — B9561 Methicillin susceptible Staphylococcus aureus infection as the cause of diseases classified elsewhere: Secondary | ICD-10-CM | POA: Diagnosis not present

## 2020-07-24 DIAGNOSIS — Z7189 Other specified counseling: Secondary | ICD-10-CM | POA: Diagnosis not present

## 2020-07-24 LAB — CBC WITH DIFFERENTIAL/PLATELET
Abs Immature Granulocytes: 0.15 10*3/uL — ABNORMAL HIGH (ref 0.00–0.07)
Basophils Absolute: 0 10*3/uL (ref 0.0–0.1)
Basophils Relative: 0 %
Eosinophils Absolute: 0 10*3/uL (ref 0.0–0.5)
Eosinophils Relative: 1 %
HCT: 42.6 % (ref 39.0–52.0)
Hemoglobin: 13.7 g/dL (ref 13.0–17.0)
Immature Granulocytes: 2 %
Lymphocytes Relative: 7 %
Lymphs Abs: 0.6 10*3/uL — ABNORMAL LOW (ref 0.7–4.0)
MCH: 27 pg (ref 26.0–34.0)
MCHC: 32.2 g/dL (ref 30.0–36.0)
MCV: 83.9 fL (ref 80.0–100.0)
Monocytes Absolute: 0.8 10*3/uL (ref 0.1–1.0)
Monocytes Relative: 10 %
Neutro Abs: 6.3 10*3/uL (ref 1.7–7.7)
Neutrophils Relative %: 80 %
Platelets: 133 10*3/uL — ABNORMAL LOW (ref 150–400)
RBC: 5.08 MIL/uL (ref 4.22–5.81)
RDW: 17 % — ABNORMAL HIGH (ref 11.5–15.5)
WBC: 7.8 10*3/uL (ref 4.0–10.5)
nRBC: 0 % (ref 0.0–0.2)

## 2020-07-24 LAB — RENAL FUNCTION PANEL
Albumin: 1.9 g/dL — ABNORMAL LOW (ref 3.5–5.0)
Anion gap: 8 (ref 5–15)
BUN: 59 mg/dL — ABNORMAL HIGH (ref 8–23)
CO2: 18 mmol/L — ABNORMAL LOW (ref 22–32)
Calcium: 9.1 mg/dL (ref 8.9–10.3)
Chloride: 121 mmol/L — ABNORMAL HIGH (ref 98–111)
Creatinine, Ser: 2.13 mg/dL — ABNORMAL HIGH (ref 0.61–1.24)
GFR, Estimated: 29 mL/min — ABNORMAL LOW (ref >60)
Glucose, Bld: 124 mg/dL — ABNORMAL HIGH (ref 70–99)
Phosphorus: 2.1 mg/dL — ABNORMAL LOW (ref 2.5–4.6)
Potassium: 3.4 mmol/L — ABNORMAL LOW (ref 3.5–5.1)
Sodium: 147 mmol/L — ABNORMAL HIGH (ref 135–145)

## 2020-07-24 MED ORDER — LORAZEPAM 1 MG PO TABS: 1.0000 mg | ORAL_TABLET | ORAL | Status: DC | PRN

## 2020-07-24 MED ORDER — GLYCOPYRROLATE 1 MG PO TABS
1.0000 mg | ORAL_TABLET | ORAL | Status: DC | PRN
  Filled 2020-07-24: qty 1

## 2020-07-24 MED ORDER — HYDROMORPHONE HCL 1 MG/ML IJ SOLN
1.0000 mg | INTRAMUSCULAR | Status: DC | PRN
  Administered 2020-07-24: 1 mg via INTRAVENOUS
  Filled 2020-07-24: qty 1

## 2020-07-24 MED ORDER — POTASSIUM CHLORIDE CRYS ER 20 MEQ PO TBCR
40.0000 meq | EXTENDED_RELEASE_TABLET | Freq: Every day | ORAL | Status: DC
  Administered 2020-07-24: 40 meq via ORAL
  Filled 2020-07-24: qty 2

## 2020-07-24 MED ORDER — LORAZEPAM 2 MG/ML PO CONC: 1.0000 mg | ORAL | Status: DC | PRN

## 2020-07-24 MED ORDER — HYDROMORPHONE HCL 1 MG/ML IJ SOLN: 1.0000 mg | INTRAMUSCULAR | Status: DC | PRN

## 2020-07-24 MED ORDER — GLYCOPYRROLATE 0.2 MG/ML IJ SOLN: 0.2000 mg | INTRAMUSCULAR | Status: DC | PRN

## 2020-07-24 MED ORDER — LORAZEPAM 2 MG/ML IJ SOLN
1.0000 mg | INTRAMUSCULAR | Status: DC | PRN
  Administered 2020-07-24: 1 mg via INTRAVENOUS
  Filled 2020-07-24: qty 1

## 2020-07-24 MED ORDER — SODIUM BICARBONATE 650 MG PO TABS
1300.0000 mg | ORAL_TABLET | Freq: Two times a day (BID) | ORAL | Status: DC
  Administered 2020-07-24: 1300 mg via ORAL
  Filled 2020-07-24: qty 2

## 2020-07-24 MED ORDER — MAGNESIUM OXIDE 400 (241.3 MG) MG PO TABS
400.0000 mg | ORAL_TABLET | Freq: Every day | ORAL | Status: DC
  Administered 2020-07-24: 400 mg via ORAL
  Filled 2020-07-24: qty 1

## 2020-07-25 LAB — CULTURE, BLOOD (ROUTINE X 2)
Culture: NO GROWTH
Culture: NO GROWTH

## 2020-08-11 ENCOUNTER — Inpatient Hospital Stay: Payer: Medicare Other | Admitting: Family

## 2020-08-16 NOTE — Progress Notes (Signed)
[facility] for Infectious Disease  Date of Admission:  07/31/2020     Total days of antibiotics 7         ASSESSMENT:  Richard Sullivan blood cultures from 4/4 have remained without growth to date. Appears safe to place PICC line at this point for prolonged course of Cefazolin for 6 weeks through 5/18. Discussed the need for antibiotics with son at bedside. Disposition appears to be skilled nursing facility. OPAT orders have been placed and will place Home Health/SNF orders as well. Arrange follow up in ID office through telehealth or in person.  PLAN:  1. Continue Cefazolin through 5/16.  2. PICC line placement prior to discharge 3. Monitor repeat blood cultures until finalized. 4. OPAT orders placed and Home Health / SNF below.  5. Arrange follow up in ID office through telehealth or in person.  Diagnosis: MSSA Bacteremia  Culture Result: MSSA  No Known Allergies  OPAT Orders Discharge antibiotics to be given via PICC line Discharge antibiotics: Cefazolin Per pharmacy protocol  Aim for Vancomycin trough 15-20 or AUC 400-550 (unless otherwise indicated) Duration:  6 weeks   End Date:  08/31/20  [facility] Care Per Protocol:  Home health RN for IV administration and teaching; PICC line care and labs.    Labs weekly while on IV antibiotics: _X_ CBC with differential _X_ BMP __ CMP _X_ CRP _X_ ESR __ Vancomycin trough __ CK  X__ Please pull PIC at completion of IV antibiotics __ Please leave PIC in place until doctor has seen patient or been notified  Fax weekly labs to [phone]  Clinic Follow Up Appt:  08/11/20 at 10:30am with Richard Piedra, NP   Principal Problem:   Acute respiratory failure with hypoxia [name]) Active Problems:   Hyperlipidemia   Subclinical hypothyroidism   Sepsis ([name])   Lactic acidosis   Community acquired pneumonia   Pulmonary vascular congestion   Severe aortic stenosis   Atrial flutter ([facility])   Hyponatremia   Acute  renal failure superimposed on stage 4 chronic kidney disease ([name])   History of anemia due to CKD   Primary hypertension   BPH (benign prostatic hyperplasia)   Fall at home, initial encounter   History of gout   Bacteremia due to methicillin susceptible Staphylococcus aureus (MSSA)   Protein-calorie malnutrition, severe   . amitriptyline  50 mg Oral QHS  . ezetimibe  10 mg Oral Daily  . feeding supplement  237 mL Oral BID BM  . magnesium oxide  400 mg Oral Daily  . metoprolol succinate  50 mg Oral Daily  . multivitamin with minerals  1 tablet Oral Daily  . potassium chloride  40 mEq Oral Daily  . sodium bicarbonate  1,300 mg Oral BID  . tamsulosin  0.4 mg Oral Daily    SUBJECTIVE:  Afebrile overnight with no acute events. Son says he ate at least 1/2 of his breakfast this morning.   No Known Allergies   Review of Systems: Review of Systems  Unable to perform ROS: Acuity of condition      OBJECTIVE: Vitals:   07/23/20 1944 2020-07-28 0007 28-Jul-2020 0713 28-Jul-2020 1022  BP: (!) 105/57 118/69 124/61 (!) 120/55  Pulse: 93 92 93 93  Resp: 20 20 19 20   Temp: 98.8 F (37.1 C) 98.2 F (36.8 C) 98.1 F (36.7 C)   TempSrc: Oral Oral Oral   SpO2: 98% 97% 97% 96%  Weight:      Height:  Body mass index is 23.09 kg/m.  Physical Exam Constitutional:      General: He is not in acute distress.    Appearance: He is well-developed.     Comments: Lying in bed with head of bed elevated watching TV.   Cardiovascular:     Rate and Rhythm: Normal rate and regular rhythm.  Skin:    General: Skin is warm and dry.  Neurological:     Mental Status: He is alert and oriented to person, place, and time.  Psychiatric:        Behavior: Behavior normal.        Thought Content: Thought content normal.        Judgment: Judgment normal.     Lab Results Lab Results  Component Value Date   WBC 7.8 08/05/2020   HGB 13.7 08/05/2020   HCT 42.6 Aug 05, 2020   MCV 83.9 08-05-20    PLT 133 (L) 2020/08/05    Lab Results  Component Value Date   CREATININE 2.13 (H) 08-05-2020   BUN 59 (H) 2020-08-05   NA 147 (H) 08-05-20   K 3.4 (L) 08/05/2020   CL 121 (H) 2020/08/05   CO2 18 (L) 2020-08-05    Lab Results  Component Value Date   ALT 14 07/22/2020   AST 44 (H) 08/02/2020   ALKPHOS 69 08/05/2020   BILITOT 1.8 (H) 08/01/2020     Microbiology: Recent Results (from the past 240 hour(s))  Blood Culture (routine x 2)     Status: Abnormal   Collection Time: 07/26/2020  9:40 AM   Specimen: BLOOD RIGHT ARM  Result Value Ref Range Status   Specimen Description BLOOD RIGHT ARM  Final   Special Requests   Final    BOTTLES DRAWN AEROBIC AND ANAEROBIC Blood Culture adequate volume   Culture  Setup Time   Final    GRAM POSITIVE COCCI IN CLUSTERS IN BOTH AEROBIC AND ANAEROBIC BOTTLES Organism ID to follow CRITICAL VALUE NOTED.  VALUE IS CONSISTENT WITH PREVIOUSLY REPORTED AND CALLED VALUE.    Culture (A)  Final    STAPHYLOCOCCUS AUREUS SUSCEPTIBILITIES PERFORMED ON PREVIOUS CULTURE WITHIN THE LAST 5 DAYS. Performed at [facility], [place] [address] [place], [place] [address]    Report Status 07/21/2020 FINAL  Final  Blood Culture (routine x 2)     Status: Abnormal   Collection Time: 08/07/2020  9:50 AM   Specimen: BLOOD RIGHT HAND  Result Value Ref Range Status   Specimen Description BLOOD RIGHT HAND  Final   Special Requests   Final    BOTTLES DRAWN AEROBIC AND ANAEROBIC Blood Culture results may not be optimal due to an inadequate volume of blood received in culture bottles   Culture  Setup Time   Final    GRAM POSITIVE COCCI IN CLUSTERS IN BOTH AEROBIC AND ANAEROBIC BOTTLES Organism ID to follow CRITICAL RESULT CALLED TO, READ BACK BY AND VERIFIED WITH: PHARMD LAURA SEAY BY MESSAN H. AT 5284 ON 08/07/2020 Performed at [facility], [place] [address], [place], [place] [address]    Culture STAPHYLOCOCCUS AUREUS (A)  Final   Report Status 07/21/2020 FINAL   Final   Organism ID, Bacteria STAPHYLOCOCCUS AUREUS  Final      Susceptibility   Staphylococcus aureus - MIC*    CIPROFLOXACIN <=0.5 SENSITIVE Sensitive     ERYTHROMYCIN <=0.25 SENSITIVE Sensitive     GENTAMICIN <=0.5 SENSITIVE Sensitive     OXACILLIN <=0.25 SENSITIVE Sensitive  TETRACYCLINE <=1 SENSITIVE Sensitive     VANCOMYCIN <=0.5 SENSITIVE Sensitive     TRIMETH/SULFA <=10 SENSITIVE Sensitive     CLINDAMYCIN <=0.25 SENSITIVE Sensitive     RIFAMPIN <=0.5 SENSITIVE Sensitive     Inducible Clindamycin NEGATIVE Sensitive     * STAPHYLOCOCCUS AUREUS  Blood Culture ID Panel (Reflexed)     Status: Abnormal   Collection Time: 08/09/2020  9:50 AM  Result Value Ref Range Status   Enterococcus faecalis NOT DETECTED NOT DETECTED Final   Enterococcus Faecium NOT DETECTED NOT DETECTED Final   Listeria monocytogenes NOT DETECTED NOT DETECTED Final   Staphylococcus species DETECTED (A) NOT DETECTED Final    Comment: CRITICAL RESULT CALLED TO, READ BACK BY AND VERIFIED WITH: PHARMD LAURA SEAY BY MESSAN H. AT 0244 ON 08/10/2020    Staphylococcus aureus (BCID) DETECTED (A) NOT DETECTED Final    Comment: CRITICAL RESULT CALLED TO, READ BACK BY AND VERIFIED WITH: PHARMD LAURA SEAY BY MESSAN H. AT 0244 ON 07/17/2020    Staphylococcus epidermidis NOT DETECTED NOT DETECTED Final   Staphylococcus lugdunensis NOT DETECTED NOT DETECTED Final   Streptococcus species NOT DETECTED NOT DETECTED Final   Streptococcus agalactiae NOT DETECTED NOT DETECTED Final   Streptococcus pneumoniae NOT DETECTED NOT DETECTED Final   Streptococcus pyogenes NOT DETECTED NOT DETECTED Final   A.calcoaceticus-baumannii NOT DETECTED NOT DETECTED Final   Bacteroides fragilis NOT DETECTED NOT DETECTED Final   Enterobacterales NOT DETECTED NOT DETECTED Final   Enterobacter cloacae complex NOT DETECTED NOT DETECTED Final   Escherichia coli NOT DETECTED NOT DETECTED Final   Klebsiella aerogenes NOT DETECTED NOT DETECTED Final    Klebsiella oxytoca NOT DETECTED NOT DETECTED Final   Klebsiella pneumoniae NOT DETECTED NOT DETECTED Final   Proteus species NOT DETECTED NOT DETECTED Final   Salmonella species NOT DETECTED NOT DETECTED Final   Serratia marcescens NOT DETECTED NOT DETECTED Final   Haemophilus influenzae NOT DETECTED NOT DETECTED Final   Neisseria meningitidis NOT DETECTED NOT DETECTED Final   Pseudomonas aeruginosa NOT DETECTED NOT DETECTED Final   Stenotrophomonas maltophilia NOT DETECTED NOT DETECTED Final   Candida albicans NOT DETECTED NOT DETECTED Final   Candida auris NOT DETECTED NOT DETECTED Final   Candida glabrata NOT DETECTED NOT DETECTED Final   Candida krusei NOT DETECTED NOT DETECTED Final   Candida parapsilosis NOT DETECTED NOT DETECTED Final   Candida tropicalis NOT DETECTED NOT DETECTED Final   Cryptococcus neoformans/gattii NOT DETECTED NOT DETECTED Final   Meth resistant mecA/C and MREJ NOT DETECTED NOT DETECTED Final    Comment: Performed at [facility], [address], [place], [place] [address]  Resp Panel by RT-PCR (Flu A&B, Covid) Nasopharyngeal Swab     Status: None   Collection Time: 08/05/2020 10:31 AM   Specimen: Nasopharyngeal Swab; Nasopharyngeal(NP) swabs in vial transport medium  Result Value Ref Range Status   SARS Coronavirus 2 by RT PCR NEGATIVE NEGATIVE Final    Comment: (NOTE) SARS-CoV-2 target nucleic acids are NOT DETECTED.  The SARS-CoV-2 RNA is generally detectable in upper respiratory specimens during the acute phase of infection. The lowest concentration of SARS-CoV-2 viral copies this assay can detect is 138 copies/mL. A negative result does not preclude SARS-Cov-2 infection and should not be used as the sole basis for treatment or other patient management decisions. A negative result may occur with  improper specimen collection/handling, submission of specimen other than nasopharyngeal swab, presence of viral mutation(s) within the areas targeted by  this  assay, and inadequate number of viral copies(<138 copies/mL). A negative result must be combined with clinical observations, patient history, and epidemiological information. The expected result is Negative.  Fact Sheet for Patients:  EntrepreneurPulse.com.au  Fact Sheet for Healthcare Providers:  IncredibleEmployment.be  This test is no t yet approved or cleared by the [place] FDA and  has been authorized for detection and/or diagnosis of SARS-CoV-2 by FDA under an Emergency Use Authorization (EUA). This EUA will remain  in effect (meaning this test can be used) for the duration of the COVID-19 declaration under Section 564(b)(1) of the Act, 21 U.S.C.section 360bbb-3(b)(1), unless the authorization is terminated  or revoked sooner.       Influenza A by PCR NEGATIVE NEGATIVE Final   Influenza B by PCR NEGATIVE NEGATIVE Final    Comment: (NOTE) The Xpert Xpress SARS-CoV-2/FLU/RSV plus assay is intended as an aid in the diagnosis of influenza from Nasopharyngeal swab specimens and should not be used as a sole basis for treatment. Nasal washings and aspirates are unacceptable for Xpert Xpress SARS-CoV-2/FLU/RSV testing.  Fact Sheet for Patients: EntrepreneurPulse.com.au  Fact Sheet for Healthcare Providers: IncredibleEmployment.be  This test is not yet approved or cleared by the [place] FDA and has been authorized for detection and/or diagnosis of SARS-CoV-2 by FDA under an Emergency Use Authorization (EUA). This EUA will remain in effect (meaning this test can be used) for the duration of the COVID-19 declaration under Section 564(b)(1) of the Act, 21 U.S.C. section 360bbb-3(b)(1), unless the authorization is terminated or revoked.  Performed at [facility], [place] [address], [place], [place] [address]   Culture, blood (routine x 2)     Status: None (Preliminary result)    Collection Time: 07/20/20  9:22 AM   Specimen: BLOOD  Result Value Ref Range Status   Specimen Description BLOOD LEFT ANTECUBITAL  Final   Special Requests   Final    BOTTLES DRAWN AEROBIC AND ANAEROBIC Blood Culture results may not be optimal due to an inadequate volume of blood received in culture bottles   Culture   Final    NO GROWTH 4 DAYS Performed at [facility], [place] [address] [place], [place] [address]    Report Status PENDING  Incomplete  Culture, blood (routine x 2)     Status: None (Preliminary result)   Collection Time: 07/20/20  9:24 AM   Specimen: BLOOD  Result Value Ref Range Status   Specimen Description BLOOD RIGHT ANTECUBITAL  Final   Special Requests   Final    BOTTLES DRAWN AEROBIC AND ANAEROBIC Blood Culture results may not be optimal due to an inadequate volume of blood received in culture bottles   Culture   Final    NO GROWTH 4 DAYS Performed at [facility], [place] [address] [place], [place] [address]    Report Status PENDING  Incomplete     Richard Sullivan, [place] [facility] Infectious [facility]  Aug 04, 2020  11:45 AM

## 2020-08-16 NOTE — TOC Progression Note (Signed)
Transition of Care [facility]) - Progression Note    Patient Details  Name: Richard Sullivan MRN: [id number] Date of Birth: Jul 17, 1930  [facility] CM/SW Contact  Milinda Antis, LCSWA Phone Number: July 28, 2020, 11:10 AM  Clinical Narrative:    CSW met with patient's son, Clair Gulling, to discuss d/c plans.  Patient's son reported that he is still trying to make a decision between taking the patient home and SNF, but is leading towards short term rehab at a SNF.  Patient's son reported that he would like to receive a list of SNF agencies, preferably one in [place], [place] but not "[place]".    Expected Discharge Plan: [place] Barriers to Discharge: [place] Work up,SNF Pending bed offer  Expected Discharge Plan and Services Expected Discharge Plan: [place] In-house Referral: Clinical Social Work                                             Social Determinants of Health (SDOH) Interventions    Readmission Risk Interventions No flowsheet data found.

## 2020-08-16 NOTE — Progress Notes (Signed)
Daily Progress Note   Patient Name: Richard Sullivan       Date: 08/09/2020 DOB: September 20, 1930  Age: 87 y.o. MRN#: [id number] Attending Physician: Kayleen Memos, DO Primary Care Physician: Tammi Sou, MD Admit Date: 07/20/2020  Reason for Consultation/Follow-up: Establishing goals of care  Subjective: Son at bedside feeding patient. Richard Sullivan is awake, voice is low- almost unintelligible. Per son he ate about half of his breakfast.   Review of Systems  Unable to perform ROS: Acuity of condition    Length of Stay: 6  Current Medications: Scheduled Meds:  . amitriptyline  50 mg Oral QHS  . ezetimibe  10 mg Oral Daily  . feeding supplement  237 mL Oral BID BM  . magnesium oxide  400 mg Oral Daily  . metoprolol succinate  50 mg Oral Daily  . multivitamin with minerals  1 tablet Oral Daily  . potassium chloride  40 mEq Oral Daily  . sodium bicarbonate  1,300 mg Oral BID  . tamsulosin  0.4 mg Oral Daily    Continuous Infusions: .  ceFAZolin (ANCEF) IV 2 g (08/09/2020 1103)    PRN Meds: acetaminophen **OR** acetaminophen, guaiFENesin-dextromethorphan, haloperidol, ipratropium-albuterol, ondansetron **OR** ondansetron (ZOFRAN) IV, polyethylene glycol  Physical Exam Vitals and nursing note reviewed.  Constitutional:      Appearance: He is ill-appearing.     Comments: Frail, cachectic  HENT:     Head: Normocephalic and atraumatic.  Eyes:     Comments: Bilateral eyes injected  Neurological:     Mental Status: He is alert. Mental status is at baseline.  Psychiatric:     Comments: Impaired judgement and insight             Vital Signs: BP (!) 120/55   Pulse 93   Temp 98.1 F (36.7 C) (Oral)   Resp 20   Ht '5\' 11"'$  (1.803 m)   Wt 75.1 kg   SpO2 96%   BMI 23.09  kg/m  SpO2: SpO2: 96 % O2 Device: O2 Device: Room Air O2 Flow Rate: O2 Flow Rate (L/min): 2 L/min  Intake/output summary:   Intake/Output Summary (Last 24 hours) at 08-09-20 1118 Last data filed at 08/09/2020 1002 Gross per 24 hour  Intake 220 ml  Output 1400 ml  Net -1180 ml   LBM: Last  BM Date: 07/23/2020 Baseline Weight: Weight: 75.1 kg Most recent weight: Weight: 75.1 kg       Palliative Assessment/Data: PPS: 30%      Patient Active Problem List   Diagnosis Date Noted  . Protein-calorie malnutrition, severe 07/20/2020  . Bacteremia due to methicillin susceptible Staphylococcus aureus (MSSA) 07/19/2020  . Acute respiratory failure with hypoxia ([place]) 08/14/2020  . Sepsis ([place]) 07/30/2020  . Lactic acidosis 08/09/2020  . Community acquired pneumonia 08/03/2020  . Pulmonary vascular congestion 08/13/2020  . Severe aortic stenosis 08/07/2020  . Atrial flutter ([place]) 08/15/2020  . Hyponatremia 07/27/2020  . Acute renal failure superimposed on stage 4 chronic kidney disease ([place]) 08/05/2020  . History of anemia due to CKD 08/10/2020  . Primary hypertension 07/22/2020  . BPH (benign prostatic hyperplasia) 07/26/2020  . Fall at home, initial encounter 08/08/2020  . History of gout 07/20/2020  . Hyperkalemia 08/02/2016  . Abnormal urine finding 03/03/2016  . Atrial fibrillation with rapid ventricular response ([place])   . Bronchitis 07/28/2015  . Chronic bronchitis with acute exacerbation ([place]) 07/28/2015  . Tachyarrhythmia 07/28/2015  . Thrombocytopenia ([place]) 04/29/2015  . Left carotid bruit 06/24/2014  . Chronic kidney disease 06/24/2014  . Subclinical hypothyroidism 06/17/2014  . Preventative health care 12/13/2013  . Chronic leukopenia 12/13/2013  . Microscopic hematuria 08/13/2013  . HTN (hypertension), benign 08/20/2012  . Hyperlipidemia 08/20/2012  . Chronic kidney disease (CKD), stage III (moderate) ([place]) 08/20/2012  . Heart murmur, systolic [id number]     Palliative Care Assessment & Plan   Patient Profile: Per intake H&P -->Tabius H Crutchfieldis a 87 y.o.malewith medical history significant ofsevere aortic stenosis conservatively managed by cardiology, a flutteron Eliquis, HTN, HLD, CKD stage IV, anemia of chronic kidney failure, BPH, chronic thrombocytopenia, hypothyroidism, gout, depression, who presented with shortness of breath, cough and generalized weakness.Workup reveals MSSA bacteremia sepsis, small, chronic subdural bleeding. Palliative medicine consulted for [place].  Assessment/Recommendations/Plan   Continue current care  PMT will follow closely and meet with son on Sunday if patient is still admitted- he is at risk for rapid decompensation  Goals of Care and Additional Recommendations:  Limitations on Scope of Treatment: Full Scope Treatment  Code Status:  DNR  Prognosis:   Unable to determine  Discharge Planning:  To Be Determined   Thank you for allowing the Palliative Medicine Team to assist in the care of this patient.   Total time: 16 mins Greater than 50%  of this time was spent counseling and coordinating care related to the above assessment and plan.  Mariana Kaufman, AGNP-C Palliative Medicine   Please contact Palliative Medicine Team phone at [phone] for questions and concerns.

## 2020-08-16 NOTE — Progress Notes (Signed)
PROGRESS NOTE  Richard Sullivan [id number] DOB: 06/02/1930 DOA: 07/21/2020 PCP: Tammi Sou, MD  HPI/Recap of past 48 hours: 87 year old gentleman with history of severe aortic stenosis on conservative management not amenable to surgery, paroxysmal a flutter on Eliquis, hypertension, hyperlipidemia, chronic kidney disease stage IV, anemia of chronic disease, hypothyroidism, gout and depression presented with shortness of breath, cough and generalized weakness for about 3 days.  Not feeling well for about 1 week, associated with a nonproductive cough.  Symptoms worse for last 24 hours prior to his presentation.  His son called EMS.  Patient lives at home and his son lives next door.  In the emergency room, temperature one 101.4.  Heart rate 104.  Respiratory 26.  Lactic acid 2.6.  Covid and influenza negative.  Chest x-ray with mild cardiomegaly, pulmonary vascular congestion and interstitial opacities.  He was subsequently found to have AKI, MSSA bacteremia.  Primary source unknown. Skeletal survey in the ER with CT scan of the cervical spine was normal. Head CT showed bilateral hygroma, thought to be chronic subdural.  Patient has flat affect and difficult to assess mental status at many times.  Remains in the hospital with MSSA bacteremia and on active treatment.  Has had episodes of confusion, however easily reoriented.  Seen by ID, not a good candidate for TEE.  Plan to treat for a full 6 weeks with IV antibiotics.  Likely will have PICC line placed on 07/23/2020 if repeat blood cultures remain negative.  2020-08-22: Patient was seen and examined at bedside.  His son was present.  He is less interactive today however.  Does not appear to be in distress.  He is being fed a pured diet by his son which he is tolerating well.  Assessment/Plan: Principal Problem:   Acute respiratory failure with hypoxia (HCC) Active Problems:   Hyperlipidemia   Subclinical hypothyroidism   Sepsis  ([place])   Lactic acidosis   Community acquired pneumonia   Pulmonary vascular congestion   Severe aortic stenosis   Atrial flutter (HCC)   Hyponatremia   Acute renal failure superimposed on stage 4 chronic kidney disease (HCC)   History of anemia due to CKD   Primary hypertension   BPH (benign prostatic hyperplasia)   Fall at home, initial encounter   History of gout   Bacteremia due to methicillin susceptible Staphylococcus aureus (MSSA)   Protein-calorie malnutrition, severe  Severe Sepsis, resolving, present on admission secondary to MSSA bacteremia, primary source unknown, possible pneumonia  Sepsis criteria has resolved. Afebrile with no leukocytosis Nonseptic appearing. Currently on Ancef, followed by ID. Plan for IV antibiotics x6 weeks, end date 08/31/2020. Will have PICC line placed today as repeated blood cultures have been negative to date.  Dysphagia He is currently on dysphagia 1 diet He is tolerating well Continue aspiration precautions.  Non anion gap metabolic acidosis in the setting of acute renal failure. Serum bicarb is uptrending 18 from 16. Repleted.  Sodium bicarb p.o. 1300 mg twice daily x2 days. Recheck in the morning  Hypokalemia Serum potassium 3.4 Repleted orally Repeat in the morning.  Physical debility/ambulatory dysfunction Seen by PT OT with recommendation for SNF/24-hour supervision assistance. TOC consulted to assist with disposition.  Resolved acute hypoxic respiratory failure secondary to atypical pneumonia versus septic pulmonary emboli Not on oxygen supplementation at baseline Currently on room air with O2 saturation of 97%.  Resolved nonoliguric AKI on chronic kidney disease stage IV:  Baseline creatinine appears to be 2.1 with GFR  of 29.   He is currently back to his baseline renal function. Presented with creatinine more than 3.  Has received gentle IV fluid hydration. Currently off IV fluid in the setting of severe aortic  stenosis Repeat renal panel in the morning. Continue to monitor urine output.  History of severe aortic stenosis A-flutter on Eliquis:  Rate is controlled. Continue Toprol-XL 50 mg daily.   Avoid IV fluid as able in the setting of severe aortic stenosis.  Improving acute metabolic encephalopathy in the setting of bilateral subdural hematoma, thought to be chronic, evaluated by neurosurgery. His mentation has improved. Continue to reorient as needed.  Goals of care Currently DNR Seen by palliative care team.  Essential hypertension: BP is currently at goal He is on Toprol-XL 50 mg daily Continue to monitor vital signs.  BPH Continue home Flomax  Hyperlipidemia Continue home Zetia   Chronic depression Continue home amitriptyline  Bilateral subdural hematoma/hygroma:   Dr. Barb Merino discussed with neurosurgery.  They thought this is chronic. If absolutely needed, neurosurgery is okay with continuing Eliquis.  Patient with poor recovery and unknown outcome, will discontinue Eliquis to decrease incidence of bleeding.  Acute thrombocytopenia Mild drop in platelet count 132 from 146 Continue to monitor   DVT prophylaxis: SCDs Start: 07/20/2020 1412 SCDs Start: 07/17/2020 1407   Code Status: DNR Family Communication: Patient's son at the bedside 4/8.   Disposition Plan: Status is: Inpatient  Remains inpatient appropriate because:Hemodynamically unstable, IV treatments appropriate due to intensity of illness or inability to take PO and Inpatient level of care appropriate due to severity of illness   Dispo: The patient is from: Home  Anticipated d/c is to: Home with home health services versus SNF pending placement.  Patient currently is not medically stable to d/c.              Difficult to place patient not applicable.         Consultants:   Palliative care  Infectious disease  Procedures:    None       Objective: Vitals:   07/23/20 1944 08-18-2020 0007 2020/08/18 0713 08-18-20 1022  BP: (!) 105/57 118/69 124/61 (!) 120/55  Pulse: 93 92 93 93  Resp: '20 20 19 20  '$ Temp: 98.8 F (37.1 C) 98.2 F (36.8 C) 98.1 F (36.7 C)   TempSrc: Oral Oral Oral   SpO2: 98% 97% 97% 96%  Weight:      Height:        Intake/Output Summary (Last 24 hours) at 08/18/20 1054 Last data filed at 18-Aug-2020 1002 Gross per 24 hour  Intake 220 ml  Output 1400 ml  Net -1180 ml   Filed Weights   07/22/2020 1020 08/07/2020 1256  Weight: 75.1 kg 75.1 kg    Exam:  . General: 86 y.o. year-old male chronically ill, frail appearing in no acute distress.  He is alert and minimally interactive.   . Cardiovascular: Regular rate and rhythm no rubs or gallops. Marland Kitchen Respiratory: Clear to auscultation no wheezes or rales.  Poor inspiratory effort.   . Abdomen: Soft nontender normal bowel sounds present. . Musculoskeletal: No lower extremity edema bilaterally.   . Skin: No ulcerative lesions noted. Marland Kitchen Psychiatry: Mood is appropriate for condition and setting.  Data Reviewed: CBC: Recent Labs  Lab 07/19/20 0203 07/20/20 0924 07/22/20 0128 07/23/20 0152 08/18/20 0224  WBC 12.0* 13.5* 10.5 9.3 7.8  NEUTROABS 11.4* 12.8* 9.3* 8.0* 6.3  HGB 12.6* 13.6 14.2 13.3 13.7  HCT  38.1* 40.3 42.9 39.9 42.6  MCV 84.3 83.4 82.5 82.1 83.9  PLT 175 184 146* 132* [id number]*   Basic Metabolic Panel: Recent Labs  Lab 07/19/20 0203 07/20/20 0924 07/21/20 0306 07/22/20 0128 July 31, 2020 0224  NA 134* 138 139 142 147*  K 4.3 3.9 3.7 3.5 3.4*  CL 107 111 113* 117* 121*  CO2 15* 15* 16* 16* 18*  GLUCOSE 117* 136* 101* 97 124*  BUN 44* 53* 53* 51* 59*  CREATININE 2.96* 2.50* 2.38* 2.16* 2.13*  CALCIUM 9.1 9.7 9.6 9.6 9.1  MG  --  1.8  --   --   --   PHOS  --  3.6  --   --  2.1*   GFR: Estimated Creatinine Clearance: 26 mL/min (A) (by C-G formula based on SCr of 2.13 mg/dL (H)). Liver Function Tests: Recent Labs   Lab 08/08/2020 0940 07/31/2020 0224  AST 44*  --   ALT 14  --   ALKPHOS 69  --   BILITOT 1.8*  --   PROT 6.1*  --   ALBUMIN 2.5* 1.9*   No results for input(s): LIPASE, AMYLASE in the last 168 hours. No results for input(s): AMMONIA in the last 168 hours. Coagulation Profile: Recent Labs  Lab 07/27/2020 0940  INR 1.6*   Cardiac Enzymes: No results for input(s): CKTOTAL, CKMB, CKMBINDEX, TROPONINI in the last 168 hours. BNP (last 3 results) No results for input(s): PROBNP in the last 8760 hours. HbA1C: No results for input(s): HGBA1C in the last 72 hours. CBG: Recent Labs  Lab 08/10/2020 1654  GLUCAP 91   Lipid Profile: No results for input(s): CHOL, HDL, LDLCALC, TRIG, CHOLHDL, LDLDIRECT in the last 72 hours. Thyroid Function Tests: No results for input(s): TSH, T4TOTAL, FREET4, T3FREE, THYROIDAB in the last 72 hours. Anemia Panel: No results for input(s): VITAMINB12, FOLATE, FERRITIN, TIBC, IRON, RETICCTPCT in the last 72 hours. Urine analysis:    Component Value Date/Time   COLORURINE YELLOW 12/13/2013 0851   APPEARANCEUR CLEAR 12/13/2013 0851   LABSPEC <=1.005 (A) 12/13/2013 0851   PHURINE 5.5 12/13/2013 0851   GLUCOSEU NEGATIVE 12/13/2013 0851   HGBUR SMALL (A) 12/13/2013 0851   BILIRUBINUR neg 03/03/2016 1531   KETONESUR NEGATIVE 12/13/2013 0851   PROTEINUR '100mg'$ /ml 03/03/2016 1531   UROBILINOGEN 0.2 03/03/2016 1531   UROBILINOGEN 0.2 12/13/2013 0851   NITRITE neg 03/03/2016 1531   NITRITE NEGATIVE 12/13/2013 0851   LEUKOCYTESUR large (3+) (A) 03/03/2016 1531   Sepsis Labs: '@LABRCNTIP'$ (procalcitonin:4,lacticidven:4)  ) Recent Results (from the past 240 hour(s))  Blood Culture (routine x 2)     Status: Abnormal   Collection Time: 07/22/2020  9:40 AM   Specimen: BLOOD RIGHT ARM  Result Value Ref Range Status   Specimen Description BLOOD RIGHT ARM  Final   Special Requests   Final    BOTTLES DRAWN AEROBIC AND ANAEROBIC Blood Culture adequate volume   Culture   Setup Time   Final    GRAM POSITIVE COCCI IN CLUSTERS IN BOTH AEROBIC AND ANAEROBIC BOTTLES Organism ID to follow CRITICAL VALUE NOTED.  VALUE IS CONSISTENT WITH PREVIOUSLY REPORTED AND CALLED VALUE.    Culture (A)  Final    STAPHYLOCOCCUS AUREUS SUSCEPTIBILITIES PERFORMED ON PREVIOUS CULTURE WITHIN THE LAST 5 DAYS. Performed at [facility], [place] [address] [place], [place] [address]    Report Status 07/21/2020 FINAL  Final  Blood Culture (routine x 2)     Status: Abnormal   Collection Time: 08/09/2020  9:50  AM   Specimen: BLOOD RIGHT HAND  Result Value Ref Range Status   Specimen Description BLOOD RIGHT HAND  Final   Special Requests   Final    BOTTLES DRAWN AEROBIC AND ANAEROBIC Blood Culture results may not be optimal due to an inadequate volume of blood received in culture bottles   Culture  Setup Time   Final    GRAM POSITIVE COCCI IN CLUSTERS IN BOTH AEROBIC AND ANAEROBIC BOTTLES Organism ID to follow CRITICAL RESULT CALLED TO, READ BACK BY AND VERIFIED WITH: PHARMD LAURA SEAY BY MESSAN H. AT [id number] ON 08/11/2020 Performed at [facility], [place] [address] [place], [place] [address]    Culture STAPHYLOCOCCUS AUREUS (A)  Final   Report Status 07/21/2020 FINAL  Final   Organism ID, Bacteria STAPHYLOCOCCUS AUREUS  Final      Susceptibility   Staphylococcus aureus - MIC*    CIPROFLOXACIN <=0.5 SENSITIVE Sensitive     ERYTHROMYCIN <=0.25 SENSITIVE Sensitive     GENTAMICIN <=0.5 SENSITIVE Sensitive     OXACILLIN <=0.25 SENSITIVE Sensitive     TETRACYCLINE <=1 SENSITIVE Sensitive     VANCOMYCIN <=0.5 SENSITIVE Sensitive     TRIMETH/SULFA <=10 SENSITIVE Sensitive     CLINDAMYCIN <=0.25 SENSITIVE Sensitive     RIFAMPIN <=0.5 SENSITIVE Sensitive     Inducible Clindamycin NEGATIVE Sensitive     * STAPHYLOCOCCUS AUREUS  Blood Culture ID Panel (Reflexed)     Status: Abnormal   Collection Time: 08/01/2020  9:50 AM  Result Value Ref Range Status   Enterococcus faecalis NOT  DETECTED NOT DETECTED Final   Enterococcus Faecium NOT DETECTED NOT DETECTED Final   Listeria monocytogenes NOT DETECTED NOT DETECTED Final   Staphylococcus species DETECTED (A) NOT DETECTED Final    Comment: CRITICAL RESULT CALLED TO, READ BACK BY AND VERIFIED WITH: PHARMD LAURA SEAY BY MESSAN H. AT 0244 ON 07/28/2020    Staphylococcus aureus (BCID) DETECTED (A) NOT DETECTED Final    Comment: CRITICAL RESULT CALLED TO, READ BACK BY AND VERIFIED WITH: PHARMD LAURA SEAY BY MESSAN H. AT 0244 ON 08/03/2020    Staphylococcus epidermidis NOT DETECTED NOT DETECTED Final   Staphylococcus lugdunensis NOT DETECTED NOT DETECTED Final   Streptococcus species NOT DETECTED NOT DETECTED Final   Streptococcus agalactiae NOT DETECTED NOT DETECTED Final   Streptococcus pneumoniae NOT DETECTED NOT DETECTED Final   Streptococcus pyogenes NOT DETECTED NOT DETECTED Final   A.calcoaceticus-baumannii NOT DETECTED NOT DETECTED Final   Bacteroides fragilis NOT DETECTED NOT DETECTED Final   Enterobacterales NOT DETECTED NOT DETECTED Final   Enterobacter cloacae complex NOT DETECTED NOT DETECTED Final   Escherichia coli NOT DETECTED NOT DETECTED Final   Klebsiella aerogenes NOT DETECTED NOT DETECTED Final   Klebsiella oxytoca NOT DETECTED NOT DETECTED Final   Klebsiella pneumoniae NOT DETECTED NOT DETECTED Final   Proteus species NOT DETECTED NOT DETECTED Final   Salmonella species NOT DETECTED NOT DETECTED Final   Serratia marcescens NOT DETECTED NOT DETECTED Final   Haemophilus influenzae NOT DETECTED NOT DETECTED Final   Neisseria meningitidis NOT DETECTED NOT DETECTED Final   Pseudomonas aeruginosa NOT DETECTED NOT DETECTED Final   Stenotrophomonas maltophilia NOT DETECTED NOT DETECTED Final   Candida albicans NOT DETECTED NOT DETECTED Final   Candida auris NOT DETECTED NOT DETECTED Final   Candida glabrata NOT DETECTED NOT DETECTED Final   Candida krusei NOT DETECTED NOT DETECTED Final   Candida  parapsilosis NOT DETECTED NOT DETECTED Final   Candida tropicalis  NOT DETECTED NOT DETECTED Final   Cryptococcus neoformans/gattii NOT DETECTED NOT DETECTED Final   Meth resistant mecA/C and MREJ NOT DETECTED NOT DETECTED Final    Comment: Performed at [facility], [place] [address], [place], [place] [address]  Resp Panel by RT-PCR (Flu A&B, Covid) Nasopharyngeal Swab     Status: None   Collection Time: 07/29/2020 10:31 AM   Specimen: Nasopharyngeal Swab; Nasopharyngeal(NP) swabs in vial transport medium  Result Value Ref Range Status   SARS Coronavirus 2 by RT PCR NEGATIVE NEGATIVE Final    Comment: (NOTE) SARS-CoV-2 target nucleic acids are NOT DETECTED.  The SARS-CoV-2 RNA is generally detectable in upper respiratory specimens during the acute phase of infection. The lowest concentration of SARS-CoV-2 viral copies this assay can detect is 138 copies/mL. A negative result does not preclude SARS-Cov-2 infection and should not be used as the sole basis for treatment or other patient management decisions. A negative result may occur with  improper specimen collection/handling, submission of specimen other than nasopharyngeal swab, presence of viral mutation(s) within the areas targeted by this assay, and inadequate number of viral copies(<138 copies/mL). A negative result must be combined with clinical observations, patient history, and epidemiological information. The expected result is Negative.  Fact Sheet for Patients:  EntrepreneurPulse.com.au  Fact Sheet for Healthcare Providers:  IncredibleEmployment.be  This test is no t yet approved or cleared by the [place] FDA and  has been authorized for detection and/or diagnosis of SARS-CoV-2 by FDA under an Emergency Use Authorization (EUA). This EUA will remain  in effect (meaning this test can be used) for the duration of the COVID-19 declaration under Section 564(b)(1) of the Act,  21 U.S.C.section 360bbb-3(b)(1), unless the authorization is terminated  or revoked sooner.       Influenza A by PCR NEGATIVE NEGATIVE Final   Influenza B by PCR NEGATIVE NEGATIVE Final    Comment: (NOTE) The Xpert Xpress SARS-CoV-2/FLU/RSV plus assay is intended as an aid in the diagnosis of influenza from Nasopharyngeal swab specimens and should not be used as a sole basis for treatment. Nasal washings and aspirates are unacceptable for Xpert Xpress SARS-CoV-2/FLU/RSV testing.  Fact Sheet for Patients: EntrepreneurPulse.com.au  Fact Sheet for Healthcare Providers: IncredibleEmployment.be  This test is not yet approved or cleared by the [place] FDA and has been authorized for detection and/or diagnosis of SARS-CoV-2 by FDA under an Emergency Use Authorization (EUA). This EUA will remain in effect (meaning this test can be used) for the duration of the COVID-19 declaration under Section 564(b)(1) of the Act, 21 U.S.C. section 360bbb-3(b)(1), unless the authorization is terminated or revoked.  Performed at [facility], [place] [address], [place], [place] [address]   Culture, blood (routine x 2)     Status: None (Preliminary result)   Collection Time: 07/20/20  9:22 AM   Specimen: BLOOD  Result Value Ref Range Status   Specimen Description BLOOD LEFT ANTECUBITAL  Final   Special Requests   Final    BOTTLES DRAWN AEROBIC AND ANAEROBIC Blood Culture results may not be optimal due to an inadequate volume of blood received in culture bottles   Culture   Final    NO GROWTH 4 DAYS Performed at [facility], [place] [address], [place], [place] [address]    Report Status PENDING  Incomplete  Culture, blood (routine x 2)     Status: None (Preliminary result)   Collection Time: 07/20/20  9:24 AM  Specimen: BLOOD  Result Value Ref Range Status   Specimen Description BLOOD RIGHT ANTECUBITAL  Final   Special Requests   Final     BOTTLES DRAWN AEROBIC AND ANAEROBIC Blood Culture results may not be optimal due to an inadequate volume of blood received in culture bottles   Culture   Final    NO GROWTH 4 DAYS Performed at [place] [address] [place], [place] [address]    Report Status PENDING  Incomplete      Studies: [place] EKG SITE RITE  Result Date: 07/26/2020 If Site Rite image not attached, placement could not be confirmed due to current cardiac rhythm.   Scheduled Meds: . amitriptyline  50 mg Oral QHS  . ezetimibe  10 mg Oral Daily  . feeding supplement  237 mL Oral BID BM  . magnesium oxide  400 mg Oral Daily  . metoprolol succinate  50 mg Oral Daily  . multivitamin with minerals  1 tablet Oral Daily  . potassium chloride  40 mEq Oral Daily  . sodium bicarbonate  1,300 mg Oral BID  . tamsulosin  0.4 mg Oral Daily    Continuous Infusions: .  ceFAZolin (ANCEF) IV 2 g (07/23/20 2106)     LOS: 6 days     Kayleen Memos, MD [facility] Pager [phone]  If 7PM-7AM, please contact night-coverage [email or web address] Password [facility] 2020/07/26, 10:54 AM

## 2020-08-16 NOTE — Progress Notes (Signed)
Pronounced death @ 1842 by 2 RN's.  Son at bedside.  MD notified.  Funeral Home request per son: [facility] [id number] /  [address] [place], [place], [place]    Anson Crofts, [place] Juleen Starr, RN

## 2020-08-16 NOTE — Progress Notes (Signed)
Received a page from patient's bedside RN about the patient having agonal breathing.  Came to bedside.  Patient appears to be actively passing.  His son was present at his bedside.  The patient's son made decision for comfort care only.  He understands that by stopping all treatment that the patient has hours or less to live.  Comfort care measures orders are in place.

## 2020-08-16 NOTE — Death Summary Note (Signed)
Death Summary  Richard Sullivan [id number] DOB: 27-Jun-1930 DOA: August 14, 2020  PCP: Tammi Sou, MD  Admit date: Aug 14, 2020 Date of Death: 2020-08-20 Time of Death: July 31, 1840 Notification: Tammi Sou, MD notified of death of Aug 20, 2020   History of present illness:  Richard Sullivan is a 87 y.o. male with a history of severe aortic stenosis on conservative management not amenable to surgery, paroxysmal a-flutter on Eliquis, hypertension, hyperlipidemia, chronic kidney disease stage IV, anemia of chronic disease, hypothyroidism, gout and chronic depression who presented with dyspnea, cough and generalized weakness for 3 days. Not feeling well for about 1 week. Symptoms worse for last 24 hours prior to his presentation.  His son called EMS. Patient lives at home by himself and his son lives next door.  In the emergency room, he was febrile with Tmax 101.4. Heart rate 104. Respiratory 26. Lactic acid 2.6. Covid and influenza negative. Chest x-ray with mild cardiomegaly, pulmonary vascular congestion and interstitial opacities. Workup revealed MSSA bacteremia and AKI on CKD IV. Primary source of infection was unknown.  Head CT showed small chronic subdural hematomas without mass effect.   Hospital course complicated by episodes of acute metabolic encephalopathy and dysphagia with recommendations for dysphagia 1 puree diet, thin liquid by speech therapist..  Patient was seen by ID due to his MSSA bacteremia.  He was not a good candidate for TEE.  Plan was to treat for a full 6 weeks with IV Cefazolin with end date of 08/31/20.   The afternoon of 08-20-20, Richard Sullivan developed agonal breathing.  The patient's son was at bedside and made decision for comfort care only.  Comfort care measures were implemented.  Richard Sullivan expired at 71.  His son was present at the time of his passing.   Final Diagnoses:  1. Severe acute hypoxic respiratory failure with possible aspiration.     -Cardiopulmonary arrest. -SevereSepsis secondary to MSSA bacteremia, primary source unknown. -Community acquired pneumonia, POA. -Severe dysphagia with concern for aspiration. -Non anion gap metabolic acidosis in the setting of acute renal failure. -Refractory Hypokalemia. -AKI on chronic kidney disease stage IV. -Severe physical debility. -Ambulatory dysfunction. -Suspected septic pulmonary emboli in the setting of MSSA bacteremia. -Severe aortic stenosis. -A-flutter on Eliquis.  -Acute metabolic encephalopathy.  -Chronic subdural hematomas. -Essential hypertension. -BPH. -Hyperlipidemia. -Chronic depression. -Acute thrombocytopenia. -DNR. -Full Comfort care.    Consultants:  Palliative care  Infectious disease  Neurosurgery  Procedures:  2D echo 07/19/2020    The results of significant diagnostics from this hospitalization (including imaging, microbiology, ancillary and laboratory) are listed below for reference.    Significant Diagnostic Studies: CT HEAD WO CONTRAST  Result Date: 07/20/2020 CLINICAL DATA:  Mental status changes.  Question subdural hematoma. EXAM: CT HEAD WITHOUT CONTRAST TECHNIQUE: Contiguous axial images were obtained from the base of the skull through the vertex without intravenous contrast. COMPARISON:  08-14-20 FINDINGS: Brain: Bilateral convexity subdural collections, maximal thickness 7 mm on the right and 5 mm on the left show very minimal enlargement with slightly more indistinct hyperdensity consistent with minimal additional subdural bleeding. Mild mass-effect upon the surface of the brain but no right-to-left shift. Cerebral hemispheres show age related atrophy with mild chronic small-vessel change of white matter. No evidence recent infarction. Vascular: There is atherosclerotic calcification of the major vessels at the base of the brain. Skull: No skull fracture. Sinuses/Orbits: Clear/normal Other: None IMPRESSION: Bilateral  convexity subdural collections, maximal thickness 7 mm on the right and 5 mm on the left  show very minimal enlargement with slightly more indistinct hyperdensity consistent with minimal additional subdural bleeding. Mild mass-effect upon the surface of the brain but no right-to-left shift. Electronically Signed   By: Nelson Chimes M.D.   On: 07/20/2020 14:00   CT HEAD WO CONTRAST  Result Date: 08/05/2020 CLINICAL DATA:  Fall. EXAM: CT HEAD WITHOUT CONTRAST TECHNIQUE: Contiguous axial images were obtained from the base of the skull through the vertex without intravenous contrast. COMPARISON:  None. FINDINGS: Brain: Bilateral mixed density subdural collections measuring up to approximately 5 mm on the right and 6 mm on the left. There is some amorphous hyperdensity within the left greater than right collection (for example series 5, image 31) which is suggestive of acute/recent hemorrhage. No substantial mass effect due to the patient's underlying cerebral volume loss. No midline shift. Basal cisterns are patent. No evidence of acute intraparenchymal hemorrhage. No evidence of acute large vascular territory infarct. No mass lesion. No hydrocephalus. Patchy white matter hypoattenuation, nonspecific but most likely related to chronic microvascular ischemic disease. Vascular: No hyperdense vessel identified. Calcific atherosclerosis. Skull: No acute fracture. Sinuses/Orbits: Small retention cyst in the inferior left maxillary sinus. Otherwise, no substantial paranasal sinus disease. Unremarkable orbits. Other: No mastoid effusions. 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 Amorphous hyperdensity within the left greater than right collections is suggestive of acute/recent hemorrhage within the collections. No substantial mass effect due to the patient's underlying cerebral volume  loss. No midline shift. Electronically Signed   By: Margaretha Sheffield MD   On: 07/26/2020 14:30   CT Cervical Spine Wo Contrast  Result Date: 07/26/2020 CLINICAL DATA:  Worsening chills with shortness-of-breath and repeated falls at home. EXAM: CT CERVICAL SPINE WITHOUT CONTRAST TECHNIQUE: Multidetector CT imaging of the cervical spine was performed without intravenous contrast. Multiplanar CT image reconstructions were also generated. COMPARISON:  None. FINDINGS: Alignment: No posttraumatic subluxation. Skull base and vertebrae: Subtle anterior wedging of T1 and T2 likely chronic. Moderate spondylosis throughout the cervical spine to include uncovertebral joint spurring and severe facet arthropathy. Possible partial fusion of the right C2-3 facets. Severe right-sided neural foraminal narrowing at the C2-3 level due to bony spurring. Severe bilateral neural foraminal narrowing from the C3-4 level to the C6-7 level. Chronic changes over the right T1-2 facets. No acute fracture visualized. Soft tissues and spinal canal: Prevertebral soft tissues are within normal. Narrowing of the right-side of the spinal canal at the C3-4 level due to adjacent bony spurring. Mild narrowing of the spinal canal at the C6-7 level due to posterior spurring. Disc levels:  Disc space narrowing at the C5-6 and C6-7 levels. Upper chest: No acute findings. Other: None. IMPRESSION: 1. No acute cervical spine injury. 2. Moderate spondylosis throughout the cervical spine with multilevel disc disease and severe multilevel neural foraminal narrowing as described above. Mild spinal canal narrowing at the C6-7 level due to posterior spurring. 3. Subtle anterior wedging of T1 and T2 likely chronic. Electronically Signed   By: Marin Olp M.D.   On: 08/04/2020 12:09   US RENAL  Result Date: 08/10/2020 CLINICAL DATA:  Renal failure EXAM: RENAL / URINARY TRACT ULTRASOUND COMPLETE COMPARISON:  June 24, 2016 FINDINGS: Right Kidney: Renal  measurements: 11.3 x 4.8 x 4.4 cm = volume: 124.2 mL. Contains a 1.4 x 1.1 x 1.0 cm mass and a separate 1.5 x 1.4 x 1.1 cm  mass. Neither definitely cystic based on today's imaging in either was seen previously. Increased cortical echogenicity. Left Kidney: Renal measurements: 10.3 x 4.7 x 4.3 cm = volume: 107.6 mL. Contains a 1.6 x 1.2 x 0.9 cm hypoechoic mass. Increased cortical echogenicity. Bladder: A left-sided ureterocele is not excluded. The bladder is otherwise normal. Other: None. IMPRESSION: 1. There are 2 masses in the right kidney and 1 on the left. The largest mass on the right measures 1.5 cm and the largest mass on the left measures 1.6 cm. These masses are not definitively cystic on provided imaging. MRI is recommended for further evaluation. The MRI should be performed in the nonacute setting. 2. Medical renal disease. 3. A left-sided ureterocele is not excluded. Electronically Signed   By: Dorise Bullion III M.D   On: 07/27/2020 15:02   DG Chest Port 1 View  Result Date: 08/01/2020 CLINICAL DATA:  Questionable sepsis EXAM: PORTABLE CHEST 1 VIEW COMPARISON:  07/28/2015 FINDINGS: Heart is mildly enlarged. Mild pulmonary vascular congestion. Interstitial opacity seen throughout both lungs. Atherosclerotic calcifications of the thoracic aorta. Large hiatal hernia. IMPRESSION: Mild cardiomegaly, pulmonary vascular congestion, and interstitial opacities most likely due to CHF/fluid volume overload. The interstitial opacities could represent atypical pneumonia. Electronically Signed   By: Miachel Roux M.D.   On: 07/19/2020 10:43   ECHOCARDIOGRAM COMPLETE  Result Date: 07/19/2020    ECHOCARDIOGRAM REPORT   Patient Name:   NICKALIS WARRELL Pajak Date of Exam: 07/19/2020 Medical Rec #:  [id number]           Height:       71.0 in Accession #:    [id number]          Weight:       165.6 lb Date of Birth:  Feb 08, 1931           BSA:          1.946 m Patient Age:    44 years            BP:           84/49 mmHg  Patient Gender: M                   HR:           76 bpm. Exam Location:  Inpatient Procedure: 2D Echo                               MODIFIED REPORT: This report was modified by Cherlynn Kaiser MD on 07/19/2020 due to conclusion.  Indications:     Bacteremia R78.81  History:         Patient has prior history of Echocardiogram examinations, most                  recent 02/11/2019. Arrythmias:Atrial Flutter and Atrial                  Fibrillation, Signs/Symptoms:Murmur and Bacteremia; Risk                  Factors:Dyslipidemia and Hypertension. Acute Renal Failure,                  Sepsis.  Sonographer:     Leavy Cella Referring Phys:  [id number] Barb Merino Diagnosing Phys: Cherlynn Kaiser MD  Sonographer Comments: Technically difficult study due to poor echo windows. IMPRESSIONS  1. The aortic valve is abnormal. There is severe calcifcation of  the aortic valve. Aortic valve regurgitation is trivial. Severe aortic valve stenosis. Aortic valve area, by VTI measures 0.68 cm. Aortic valve mean gradient measures 46.0 mmHg. Aortic valve Vmax measures 4.18 m/s.  2. Left ventricular ejection fraction, by estimation, is 55 to 60%. The left ventricle has normal function. The left ventricle has no regional wall motion abnormalities. There is severe left ventricular hypertrophy. Left ventricular diastolic parameters  are indeterminate.  3. Right ventricular systolic function is normal. The right ventricular size is normal. There is mildly elevated pulmonary artery systolic pressure. The estimated right ventricular systolic pressure is Q000111Q mmHg.  4. Left atrial size was moderately dilated.  5. Right atrial size was mildly dilated.  6. The mitral valve is degenerative. Mild mitral valve regurgitation. Mild mitral stenosis. Moderate mitral annular calcification.  7. The inferior vena cava is normal in size with greater than 50% respiratory variability, suggesting right atrial pressure of 3 mmHg.  Conclusion(s)/Recommendation(s): No evidence of valvular vegetations on this transthoracic echocardiogram. If clinically appropriate, consider TEE. FINDINGS  Left Ventricle: Left ventricular ejection fraction, by estimation, is 55 to 60%. The left ventricle has normal function. The left ventricle has no regional wall motion abnormalities. The left ventricular internal cavity size was normal in size. There is  severe left ventricular hypertrophy. Left ventricular diastolic parameters are indeterminate. Right Ventricle: The right ventricular size is normal. No increase in right ventricular wall thickness. Right ventricular systolic function is normal. There is mildly elevated pulmonary artery systolic pressure. The tricuspid regurgitant velocity is 3.19  m/s, and with an assumed right atrial pressure of 3 mmHg, the estimated right ventricular systolic pressure is Q000111Q mmHg. Left Atrium: Left atrial size was moderately dilated. Right Atrium: Right atrial size was mildly dilated. Pericardium: There is no evidence of pericardial effusion. Mitral Valve: The mitral valve is degenerative in appearance. Moderate mitral annular calcification. Mild mitral valve regurgitation. Mild mitral valve stenosis. The mean mitral valve gradient is 4.5 mmHg with average heart rate of 91 bpm. Tricuspid Valve: The tricuspid valve is normal in structure. Tricuspid valve regurgitation is mild . No evidence of tricuspid stenosis. Aortic Valve: The aortic valve is abnormal. There is severe calcifcation of the aortic valve. Aortic valve regurgitation is trivial. Aortic regurgitation PHT measures 297 msec. Severe aortic stenosis is present. Aortic valve mean gradient measures 46.0 mmHg. Aortic valve peak gradient measures 69.9 mmHg. Aortic valve area, by VTI measures 0.68 cm. Pulmonic Valve: The pulmonic valve was normal in structure. Pulmonic valve regurgitation is not visualized. No evidence of pulmonic stenosis. Aorta: The aortic root is  normal in size and structure. Venous: The inferior vena cava is normal in size with greater than 50% respiratory variability, suggesting right atrial pressure of 3 mmHg. IAS/Shunts: No atrial level shunt detected by color flow Doppler.  LEFT VENTRICLE PLAX 2D LVIDd:         2.90 cm  Diastology LVIDs:         2.40 cm  LV e' medial:    6.08 cm/s LV PW:         1.60 cm  LV E/e' medial:  26.6 LV IVS:        1.60 cm  LV e' lateral:   9.00 cm/s LVOT diam:     2.00 cm  LV E/e' lateral: 18.0 LV SV:         53 LV SV Index:   27 LVOT Area:     3.14 cm  RIGHT VENTRICLE RV S prime:  14.60 cm/s LEFT ATRIUM             Index       RIGHT ATRIUM           Index LA diam:        3.60 cm 1.85 cm/m  RA Area:     18.90 cm LA Vol (A2C):   53.0 ml 27.24 ml/m RA Volume:   58.40 ml  30.01 ml/m LA Vol (A4C):   59.2 ml 30.42 ml/m LA Biplane Vol: 59.1 ml 30.37 ml/m  AORTIC VALVE AV Area (Vmax):    0.56 cm AV Area (Vmean):   0.54 cm AV Area (VTI):     0.68 cm AV Vmax:           418.00 cm/s AV Vmean:          301.333 cm/s AV VTI:            0.779 m AV Peak Grad:      69.9 mmHg AV Mean Grad:      46.0 mmHg LVOT Vmax:         74.15 cm/s LVOT Vmean:        51.831 cm/s LVOT VTI:          0.169 m LVOT/AV VTI ratio: 0.22 AI PHT:            297 msec  AORTA Ao Root diam: 3.30 cm MITRAL VALVE                TRICUSPID VALVE MV Area (PHT): 3.97 cm     TR Peak grad:   40.7 mmHg MV Mean grad:  4.5 mmHg     TR Vmax:        319.00 cm/s MV Decel Time: 191 msec MR Peak grad: 106.5 mmHg    SHUNTS MR Vmax:      516.00 cm/s   Systemic VTI:  0.17 m MV E velocity: 162.00 cm/s  Systemic Diam: 2.00 cm MV A velocity: 77.70 cm/s MV E/A ratio:  2.08 Cherlynn Kaiser MD Electronically signed by Cherlynn Kaiser MD Signature Date/Time: 07/19/2020/4:39:28 PM    Final (Updated)    [place] EKG SITE RITE  Result Date: 08-13-20 If Site Rite image not attached, placement could not be confirmed due to current cardiac rhythm.   Microbiology: Recent Results (from the  past 240 hour(s))  Blood Culture (routine x 2)     Status: Abnormal   Collection Time: 07/27/2020  9:40 AM   Specimen: BLOOD RIGHT ARM  Result Value Ref Range Status   Specimen Description BLOOD RIGHT ARM  Final   Special Requests   Final    BOTTLES DRAWN AEROBIC AND ANAEROBIC Blood Culture adequate volume   Culture  Setup Time   Final    GRAM POSITIVE COCCI IN CLUSTERS IN BOTH AEROBIC AND ANAEROBIC BOTTLES Organism ID to follow CRITICAL VALUE NOTED.  VALUE IS CONSISTENT WITH PREVIOUSLY REPORTED AND CALLED VALUE.    Culture (A)  Final    STAPHYLOCOCCUS AUREUS SUSCEPTIBILITIES PERFORMED ON PREVIOUS CULTURE WITHIN THE LAST 5 DAYS. Performed at [facility], [place] [address] [place], [place] [address]    Report Status 07/21/2020 FINAL  Final  Blood Culture (routine x 2)     Status: Abnormal   Collection Time: 08/08/2020  9:50 AM   Specimen: BLOOD RIGHT HAND  Result Value Ref Range Status   Specimen Description BLOOD RIGHT HAND  Final   Special Requests   Final  BOTTLES DRAWN AEROBIC AND ANAEROBIC Blood Culture results may not be optimal due to an inadequate volume of blood received in culture bottles   Culture  Setup Time   Final    GRAM POSITIVE COCCI IN CLUSTERS IN BOTH AEROBIC AND ANAEROBIC BOTTLES Organism ID to follow CRITICAL RESULT CALLED TO, READ BACK BY AND VERIFIED WITH: PHARMD LAURA SEAY BY MESSAN H. AT [id number] ON 08/07/2020 Performed at [facility], [place] [address], [place], [place] [address]    Culture STAPHYLOCOCCUS AUREUS (A)  Final   Report Status 07/21/2020 FINAL  Final   Organism ID, Bacteria STAPHYLOCOCCUS AUREUS  Final      Susceptibility   Staphylococcus aureus - MIC*    CIPROFLOXACIN <=0.5 SENSITIVE Sensitive     ERYTHROMYCIN <=0.25 SENSITIVE Sensitive     GENTAMICIN <=0.5 SENSITIVE Sensitive     OXACILLIN <=0.25 SENSITIVE Sensitive     TETRACYCLINE <=1 SENSITIVE Sensitive     VANCOMYCIN <=0.5 SENSITIVE Sensitive     TRIMETH/SULFA <=10 SENSITIVE  Sensitive     CLINDAMYCIN <=0.25 SENSITIVE Sensitive     RIFAMPIN <=0.5 SENSITIVE Sensitive     Inducible Clindamycin NEGATIVE Sensitive     * STAPHYLOCOCCUS AUREUS  Blood Culture ID Panel (Reflexed)     Status: Abnormal   Collection Time: 07/23/2020  9:50 AM  Result Value Ref Range Status   Enterococcus faecalis NOT DETECTED NOT DETECTED Final   Enterococcus Faecium NOT DETECTED NOT DETECTED Final   Listeria monocytogenes NOT DETECTED NOT DETECTED Final   Staphylococcus species DETECTED (A) NOT DETECTED Final    Comment: CRITICAL RESULT CALLED TO, READ BACK BY AND VERIFIED WITH: PHARMD LAURA SEAY BY MESSAN H. AT 0244 ON 07/23/2020    Staphylococcus aureus (BCID) DETECTED (A) NOT DETECTED Final    Comment: CRITICAL RESULT CALLED TO, READ BACK BY AND VERIFIED WITH: PHARMD LAURA SEAY BY MESSAN H. AT 0244 ON 08/07/2020    Staphylococcus epidermidis NOT DETECTED NOT DETECTED Final   Staphylococcus lugdunensis NOT DETECTED NOT DETECTED Final   Streptococcus species NOT DETECTED NOT DETECTED Final   Streptococcus agalactiae NOT DETECTED NOT DETECTED Final   Streptococcus pneumoniae NOT DETECTED NOT DETECTED Final   Streptococcus pyogenes NOT DETECTED NOT DETECTED Final   A.calcoaceticus-baumannii NOT DETECTED NOT DETECTED Final   Bacteroides fragilis NOT DETECTED NOT DETECTED Final   Enterobacterales NOT DETECTED NOT DETECTED Final   Enterobacter cloacae complex NOT DETECTED NOT DETECTED Final   Escherichia coli NOT DETECTED NOT DETECTED Final   Klebsiella aerogenes NOT DETECTED NOT DETECTED Final   Klebsiella oxytoca NOT DETECTED NOT DETECTED Final   Klebsiella pneumoniae NOT DETECTED NOT DETECTED Final   Proteus species NOT DETECTED NOT DETECTED Final   Salmonella species NOT DETECTED NOT DETECTED Final   Serratia marcescens NOT DETECTED NOT DETECTED Final   Haemophilus influenzae NOT DETECTED NOT DETECTED Final   Neisseria meningitidis NOT DETECTED NOT DETECTED Final   Pseudomonas  aeruginosa NOT DETECTED NOT DETECTED Final   Stenotrophomonas maltophilia NOT DETECTED NOT DETECTED Final   Candida albicans NOT DETECTED NOT DETECTED Final   Candida auris NOT DETECTED NOT DETECTED Final   Candida glabrata NOT DETECTED NOT DETECTED Final   Candida krusei NOT DETECTED NOT DETECTED Final   Candida parapsilosis NOT DETECTED NOT DETECTED Final   Candida tropicalis NOT DETECTED NOT DETECTED Final   Cryptococcus neoformans/gattii NOT DETECTED NOT DETECTED Final   Meth resistant mecA/C and MREJ NOT DETECTED NOT DETECTED Final    Comment: Performed at  [facility], [place] [address], [place], [place] [address]  Resp Panel by RT-PCR (Flu A&B, Covid) Nasopharyngeal Swab     Status: None   Collection Time: 08/11/2020 10:31 AM   Specimen: Nasopharyngeal Swab; Nasopharyngeal(NP) swabs in vial transport medium  Result Value Ref Range Status   SARS Coronavirus 2 by RT PCR NEGATIVE NEGATIVE Final    Comment: (NOTE) SARS-CoV-2 target nucleic acids are NOT DETECTED.  The SARS-CoV-2 RNA is generally detectable in upper respiratory specimens during the acute phase of infection. The lowest concentration of SARS-CoV-2 viral copies this assay can detect is 138 copies/mL. A negative result does not preclude SARS-Cov-2 infection and should not be used as the sole basis for treatment or other patient management decisions. A negative result may occur with  improper specimen collection/handling, submission of specimen other than nasopharyngeal swab, presence of viral mutation(s) within the areas targeted by this assay, and inadequate number of viral copies(<138 copies/mL). A negative result must be combined with clinical observations, patient history, and epidemiological information. The expected result is Negative.  Fact Sheet for Patients:  EntrepreneurPulse.com.au  Fact Sheet for Healthcare Providers:  IncredibleEmployment.be  This test is no t yet  approved or cleared by the [place] FDA and  has been authorized for detection and/or diagnosis of SARS-CoV-2 by FDA under an Emergency Use Authorization (EUA). This EUA will remain  in effect (meaning this test can be used) for the duration of the COVID-19 declaration under Section 564(b)(1) of the Act, 21 U.S.C.section 360bbb-3(b)(1), unless the authorization is terminated  or revoked sooner.       Influenza A by PCR NEGATIVE NEGATIVE Final   Influenza B by PCR NEGATIVE NEGATIVE Final    Comment: (NOTE) The Xpert Xpress SARS-CoV-2/FLU/RSV plus assay is intended as an aid in the diagnosis of influenza from Nasopharyngeal swab specimens and should not be used as a sole basis for treatment. Nasal washings and aspirates are unacceptable for Xpert Xpress SARS-CoV-2/FLU/RSV testing.  Fact Sheet for Patients: EntrepreneurPulse.com.au  Fact Sheet for Healthcare Providers: IncredibleEmployment.be  This test is not yet approved or cleared by the [place] FDA and has been authorized for detection and/or diagnosis of SARS-CoV-2 by FDA under an Emergency Use Authorization (EUA). This EUA will remain in effect (meaning this test can be used) for the duration of the COVID-19 declaration under Section 564(b)(1) of the Act, 21 U.S.C. section 360bbb-3(b)(1), unless the authorization is terminated or revoked.  Performed at [facility], [place] [address], [place], [place] [address]   Culture, blood (routine x 2)     Status: None (Preliminary result)   Collection Time: 07/20/20  9:22 AM   Specimen: BLOOD  Result Value Ref Range Status   Specimen Description BLOOD LEFT ANTECUBITAL  Final   Special Requests   Final    BOTTLES DRAWN AEROBIC AND ANAEROBIC Blood Culture results may not be optimal due to an inadequate volume of blood received in culture bottles   Culture   Final    NO GROWTH 4 DAYS Performed at [facility], [place] [address], [place], [place] [address]    Report Status PENDING  Incomplete  Culture, blood (routine x 2)     Status: None (Preliminary result)   Collection Time: 07/20/20  9:24 AM   Specimen: BLOOD  Result Value Ref Range Status   Specimen Description BLOOD RIGHT ANTECUBITAL  Final   Special Requests   Final    BOTTLES DRAWN AEROBIC AND  ANAEROBIC Blood Culture results may not be optimal due to an inadequate volume of blood received in culture bottles   Culture   Final    NO GROWTH 4 DAYS Performed at [facility] [address] [place], [place] [address]    Report Status PENDING  Incomplete     Labs: Basic Metabolic Panel: Recent Labs  Lab 07/19/20 0203 07/20/20 0924 07/21/20 0306 07/22/20 0128 08-10-2020 0224  NA 134* 138 139 142 147*  K 4.3 3.9 3.7 3.5 3.4*  CL 107 111 113* 117* 121*  CO2 15* 15* 16* 16* 18*  GLUCOSE 117* 136* 101* 97 124*  BUN 44* 53* 53* 51* 59*  CREATININE 2.96* 2.50* 2.38* 2.16* 2.13*  CALCIUM 9.1 9.7 9.6 9.6 9.1  MG  --  1.8  --   --   --   PHOS  --  3.6  --   --  2.1*   Liver Function Tests: Recent Labs  Lab 08/01/2020 0940 August 10, 2020 0224  AST 44*  --   ALT 14  --   ALKPHOS 69  --   BILITOT 1.8*  --   PROT 6.1*  --   ALBUMIN 2.5* 1.9*   No results for input(s): LIPASE, AMYLASE in the last 168 hours. No results for input(s): AMMONIA in the last 168 hours. CBC: Recent Labs  Lab 07/19/20 0203 07/20/20 0924 07/22/20 0128 07/23/20 0152 08-10-2020 0224  WBC 12.0* 13.5* 10.5 9.3 7.8  NEUTROABS 11.4* 12.8* 9.3* 8.0* 6.3  HGB 12.6* 13.6 14.2 13.3 13.7  HCT 38.1* 40.3 42.9 39.9 42.6  MCV 84.3 83.4 82.5 82.1 83.9  PLT 175 184 146* 132* 133*   Cardiac Enzymes: No results for input(s): CKTOTAL, CKMB, CKMBINDEX, TROPONINI in the last 168 hours. D-Dimer No results for input(s): DDIMER in the last 72 hours. BNP: Invalid input(s): POCBNP CBG: Recent Labs  Lab 07/25/2020 1654  GLUCAP 91   Anemia work up No results for input(s): VITAMINB12,  FOLATE, FERRITIN, TIBC, IRON, RETICCTPCT in the last 72 hours. Urinalysis    Component Value Date/Time   COLORURINE YELLOW 12/13/2013 0851   APPEARANCEUR CLEAR 12/13/2013 0851   LABSPEC <=1.005 (A) 12/13/2013 0851   PHURINE 5.5 12/13/2013 0851   GLUCOSEU NEGATIVE 12/13/2013 0851   HGBUR SMALL (A) 12/13/2013 0851   BILIRUBINUR neg 03/03/2016 1531   KETONESUR NEGATIVE 12/13/2013 0851   PROTEINUR '100mg'$ /ml 03/03/2016 1531   UROBILINOGEN 0.2 03/03/2016 1531   UROBILINOGEN 0.2 12/13/2013 0851   NITRITE neg 03/03/2016 1531   NITRITE NEGATIVE 12/13/2013 0851   LEUKOCYTESUR large (3+) (A) 03/03/2016 1531   Sepsis Labs Invalid input(s): PROCALCITONIN,  WBC,  LACTICIDVEN     SIGNED:  Kayleen Memos, MD  [facility] 08-10-20, 8:10 PM Pager   If 7PM-7AM, please contact night-coverage [email or web address] Password [email or web address]

## 2020-08-16 NOTE — Progress Notes (Signed)
  Speech Language Pathology Treatment: Dysphagia  Patient Details Name: JOHNATAN LYNDON MRN: [id number] DOB: 1930/12/12 Today's Date: 11-Aug-2020 Time: [id number] SLP Time Calculation (min) (ACUTE ONLY): 12 min  Assessment / Plan / Recommendation Clinical Impression  One cough was noted throughout all PO trials; however, pt was leaning to the L despite efforts from SLP to reposition him. Of note, pt consumed almost an entire container of applesauce and larger volumes of water. He continues to demonstrate a cognitively-based oral dysphagia which prevented trials of upgraded solids. Pt benefited from verbal cues for increased awareness of boluses throughout PO trials, such as cues to open his mouth wide enough for intake via spoon. Given his current mentation, continue to recommend a DYS 1 diet with thin liquids. SLP will continue to follow.    HPI HPI: Pt is an 87 y.o. male admitted 08/05/2020 with SOB, cough, generalized weakness, falls. Workup for acute hypoxic respiratory failure due to CHF/fluid overload; potential for atypical PNA. 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 Pt with worsening confusion; repeat head CT 4/4 with minimal enlargement of subdural collections, consistent with minimal additional subdural bleeding, mild mass-effect, no midline shift. PMH includes severe aortic stenosis, aflutter on Eliquis, HTN, CKD IV, anemia, BPH, gout, depression      SLP Plan  Continue with current plan of care       Recommendations  Diet recommendations: Dysphagia 1 (puree);Thin liquid Liquids provided via: Cup;Straw Medication Administration: Crushed with puree Supervision: Staff to assist with self feeding;Full supervision/cueing for compensatory strategies Compensations: Minimize environmental distractions;Slow rate;Small sips/bites;Other (Comment) (check for oral residue) Postural Changes and/or Swallow Maneuvers: Seated upright 90 degrees                 Oral Care Recommendations: Oral care BID Follow up Recommendations: Skilled Nursing facility SLP Visit Diagnosis: Dysphagia, unspecified (R13.10) Plan: Continue with current plan of care       GO                Jeanine Luz., SLP Student 08/11/20, 2:04 PM

## 2020-08-16 NOTE — TOC Progression Note (Signed)
Transition of Care [facility]) - Progression Note    Patient Details  Name: KIPPER WERTHEIMER MRN: [id number] Date of Birth: 13-Apr-1931  Transition of Care [facility]) CM/SW [place], [place] Phone Number: Aug 23, 2020, 4:22 PM  Clinical Narrative:     Left copy of Medicare.gov SNF listing in room for patient's son to review incase disposition becomes SNF.   Thurmond Butts, MSW, LCSW Clinical Social Worker   Expected Discharge Plan: Skilled Nursing Facility Barriers to Discharge: Insurance Authorization,Continued Medical Work up,SNF Pending bed offer  Expected Discharge Plan and Services Expected Discharge Plan: [place] In-house Referral: Clinical Social Work                                             Social Determinants of Health (SDOH) Interventions    Readmission Risk Interventions No flowsheet data found.

## 2020-08-16 NOTE — Progress Notes (Signed)
Patient with agonal breathing  O2 sats 70s.  Unresponsive to painful stimuli.  Repositioned and oral suction.  Placed patient on O2.  Pt breathing a little better and minimally responsive.  BP 93/44  HR 120  RR 28  O2 sat 92%. Patient with DNR status. Son arrived at bedside Dr [place] Crane notified of change in patient status.

## 2020-08-16 DEATH — deceased

## 2020-12-03 ENCOUNTER — Encounter: Payer: Medicare Other | Admitting: Family Medicine

## 2022-03-02 IMAGING — CT CT CERVICAL SPINE W/O CM
2 series · 11 of 27 positions shown, 14 images · non-contrast
Comparison: None.

CLINICAL DATA: Worsening chills with shortness-of-breath and
repeated falls at home.

EXAM:
CT CERVICAL SPINE WITHOUT CONTRAST
TECHNIQUE: Multidetector CT imaging of the cervical spine was performed without
intravenous contrast. Multiplanar CT image reconstructions were also
generated.

[Series 5: c_spine 2.0 st · axial · 0.51mm/px · z∈[-274,-154]mm · 6 of 78 slices shown, 8 images]
[im 12/78  soft-tissue]
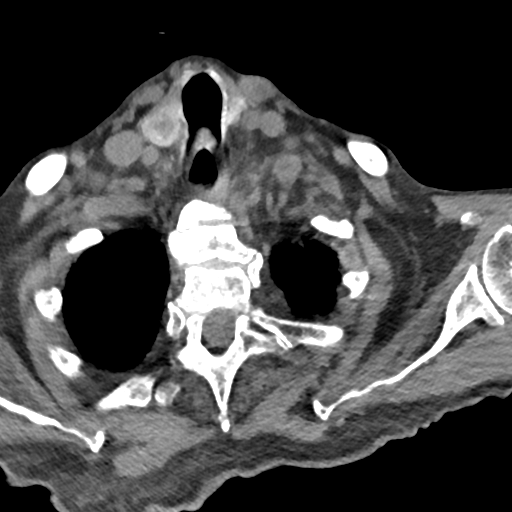
[im 12/78  bone]
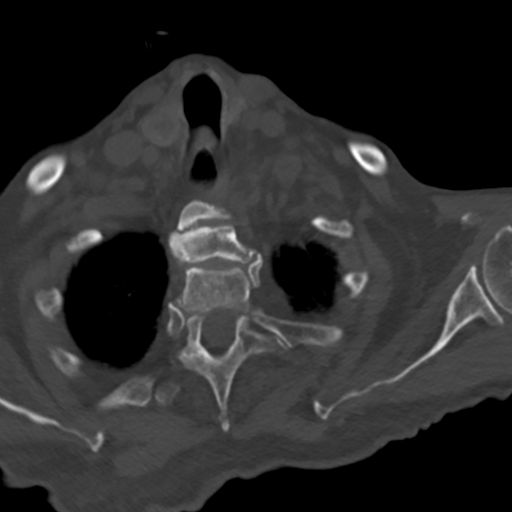
[im 24/78  bone]
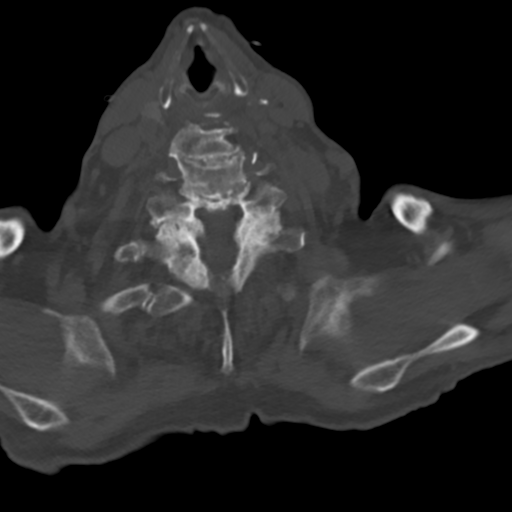
[im 36/78  bone]
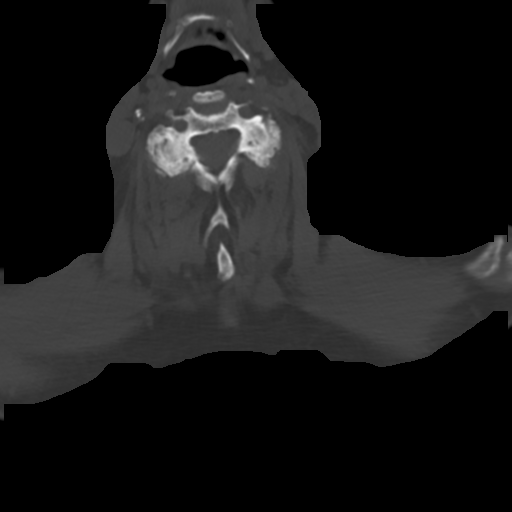
[im 48/78  bone]
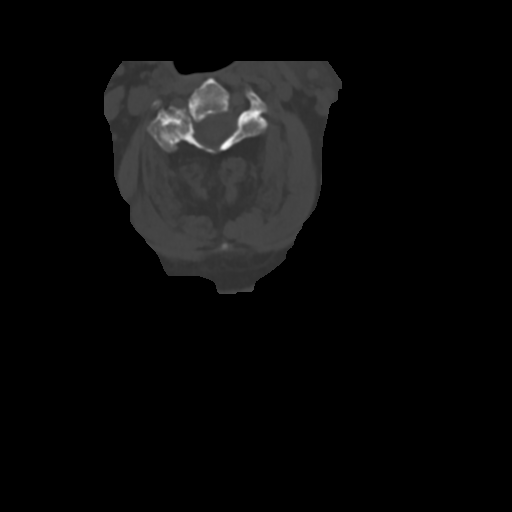
[im 60/78  soft-tissue]
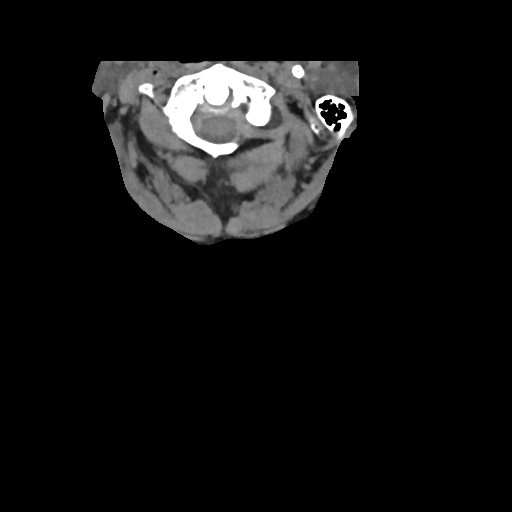
[im 60/78  bone]
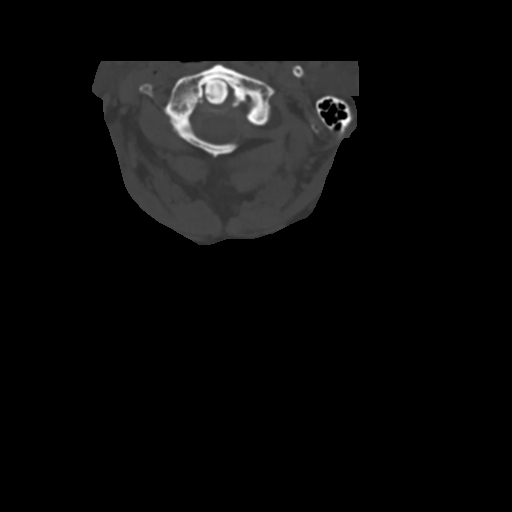
[im 72/78  bone]
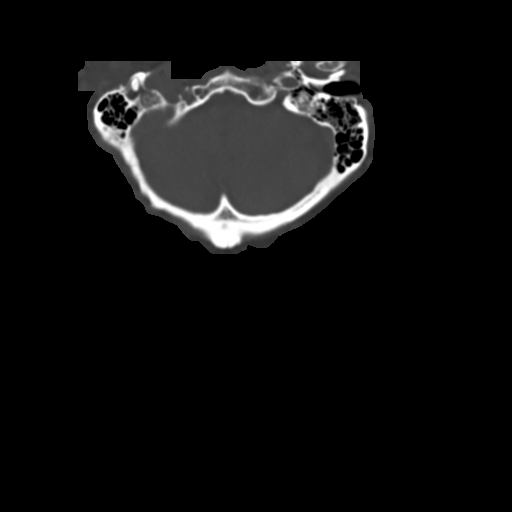

[Series 9: sagittal bone · sagittal · 0.31mm/px · 5 of 61 slices shown, 6 images]
[im 21/61  bone]
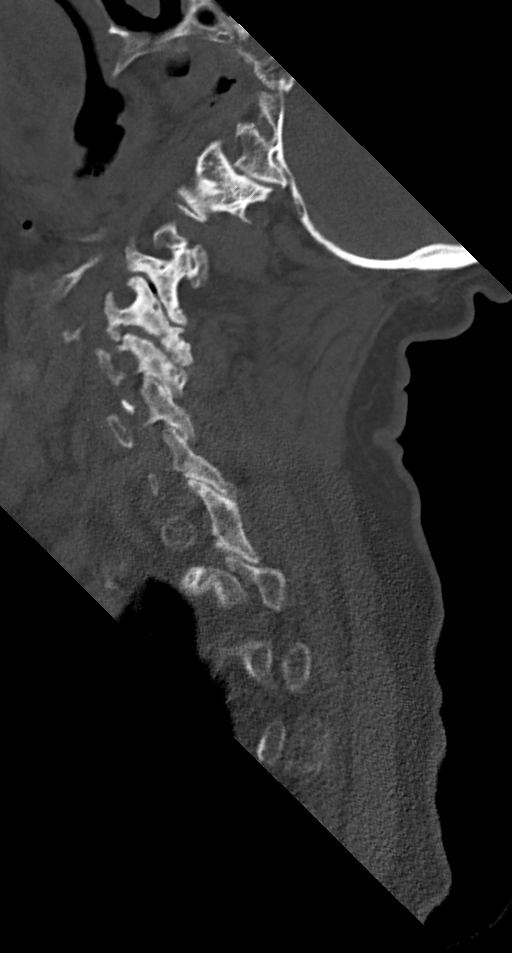
[im 26/61  bone]
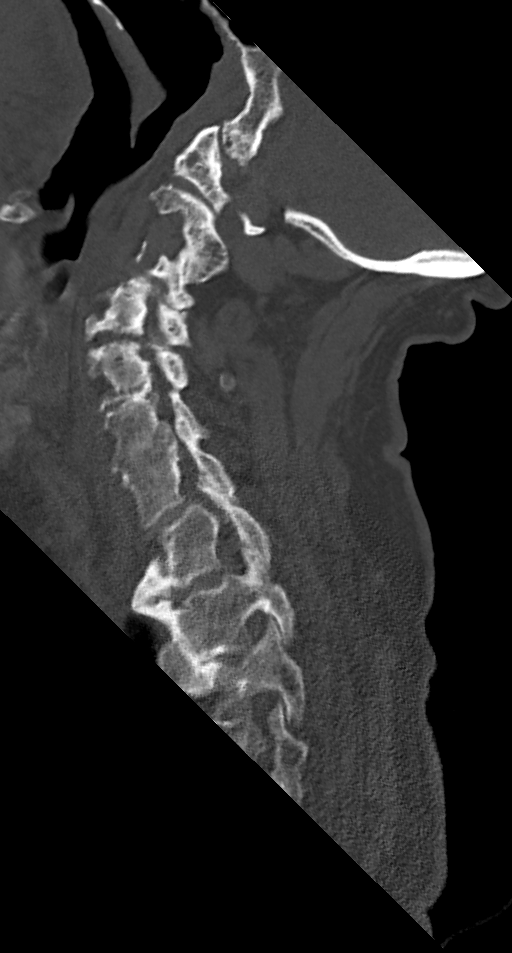
[im 31/61  soft-tissue]
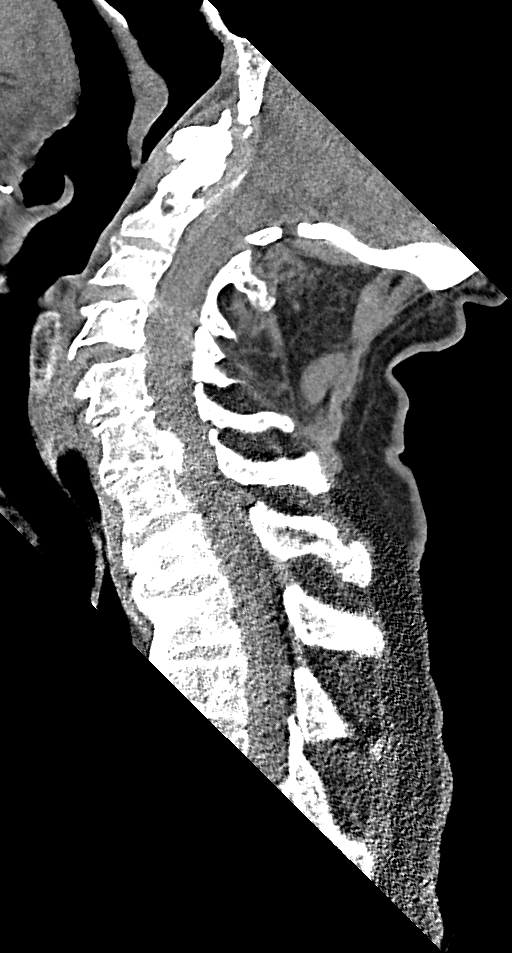
[im 31/61  bone]
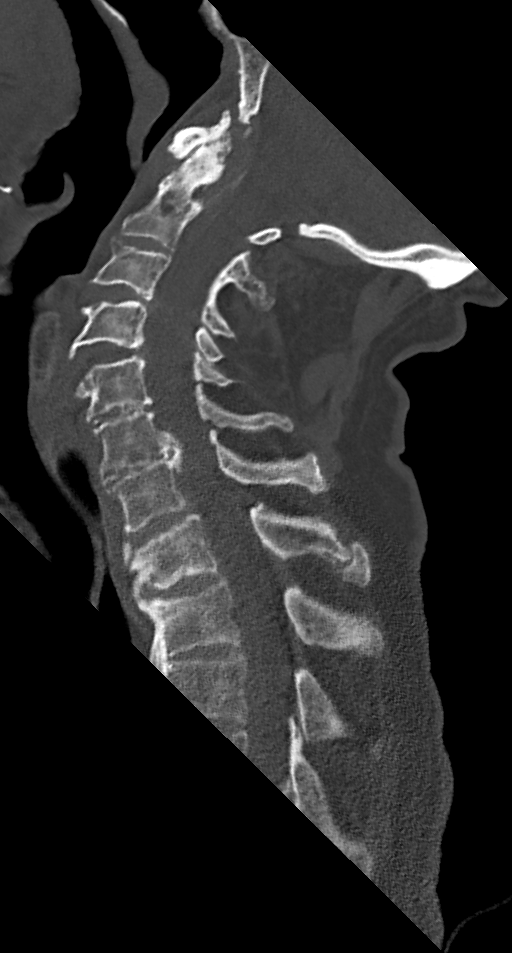
[im 36/61  bone]
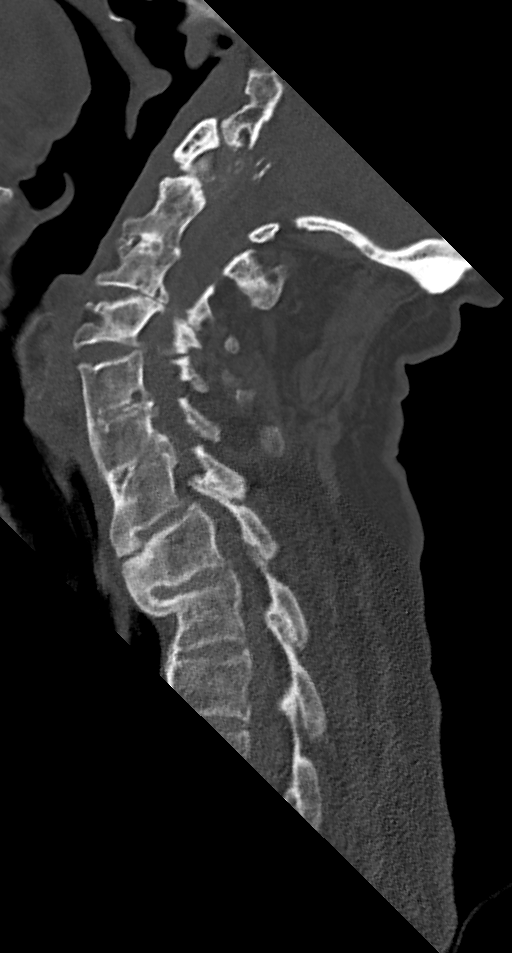
[im 41/61  bone]
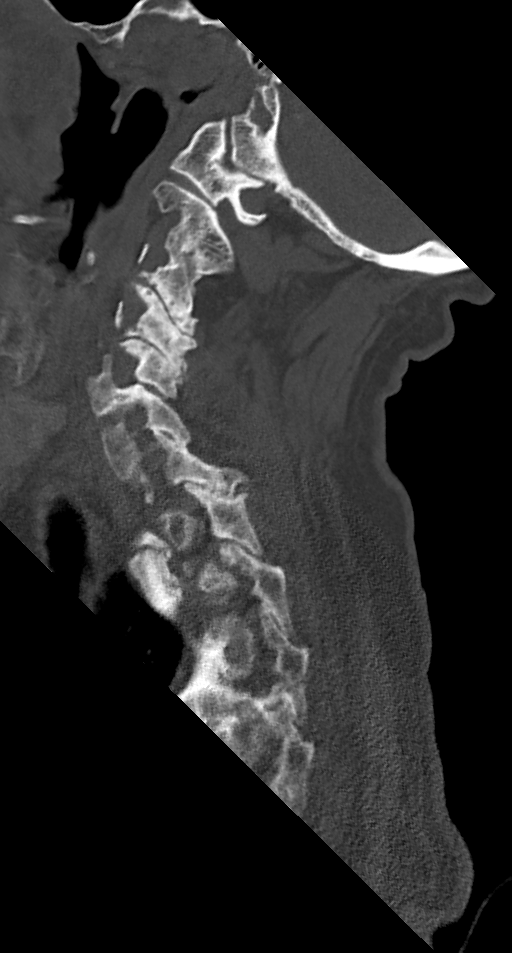

[11 of 27 positions shown; findings below may reference images not displayed]

FINDINGS: Alignment: No posttraumatic subluxation.

Skull base and vertebrae: Subtle anterior wedging of T1 and T2
likely chronic. Moderate spondylosis throughout the cervical spine
to include uncovertebral joint spurring and severe facet
arthropathy. Possible partial fusion of the right C2-3 facets.
Severe right-sided neural foraminal narrowing at the C2-3 level due
to bony spurring. Severe bilateral neural foraminal narrowing from
the C3-4 level to the C6-7 level. Chronic changes over the right
T1-2 facets. No acute fracture visualized.

Soft tissues and spinal canal: Prevertebral soft tissues are within
normal. Narrowing of the right-side of the spinal canal at the C3-4
level due to adjacent bony spurring. Mild narrowing of the spinal
canal at the C6-7 level due to posterior spurring.

Disc levels:  Disc space narrowing at the C5-6 and C6-7 levels.

Upper chest: No acute findings.

Other: None.
IMPRESSION: 1. No acute cervical spine injury.
2. Moderate spondylosis throughout the cervical spine with
multilevel disc disease and severe multilevel neural foraminal
narrowing as described above. Mild spinal canal narrowing at the
C6-7 level due to posterior spurring.
3. Subtle anterior wedging of T1 and T2 likely chronic.

## 2022-03-02 IMAGING — CT CT HEAD W/O CM
4 series · 16 of 47 positions shown, 18 images · non-contrast
Comparison: None.

CLINICAL DATA: Fall.

EXAM:
CT HEAD WITHOUT CONTRAST
TECHNIQUE: Contiguous axial images were obtained from the base of the skull
through the vertex without intravenous contrast.

[Series 3: head wo · axial · 0.47mm/px · z∈[-160,-35]mm · 7 of 35 slices shown, 9 images]
[im 5/35  brain]
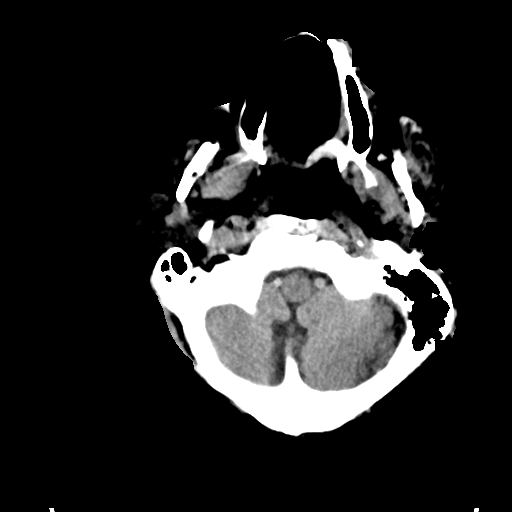
[im 5/35  bone]
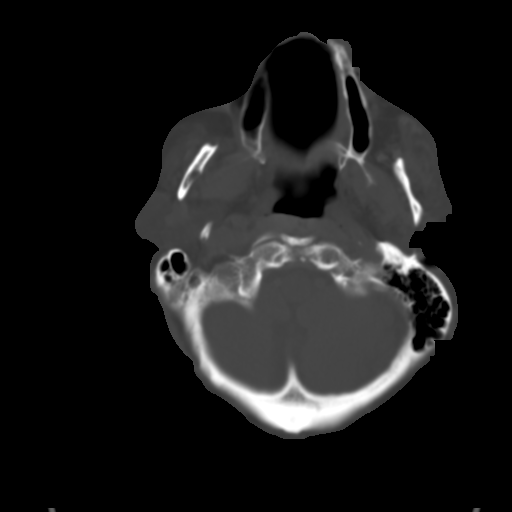
[im 9/35  brain]
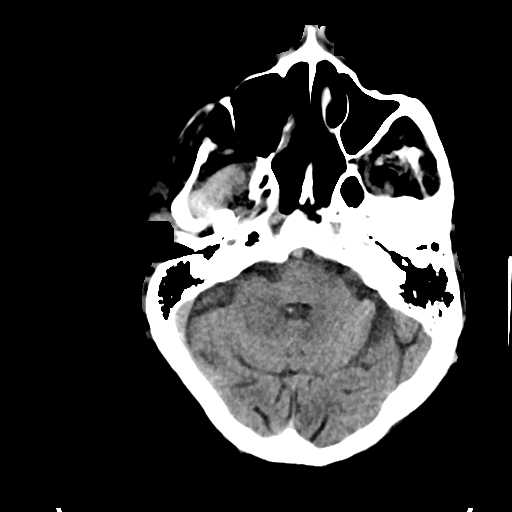
[im 13/35  brain]
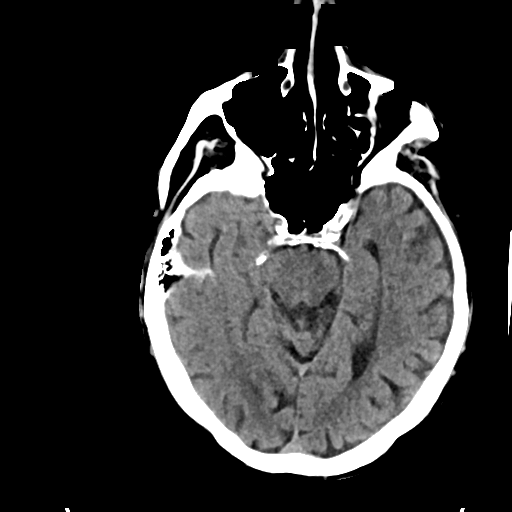
[im 18/35  brain]
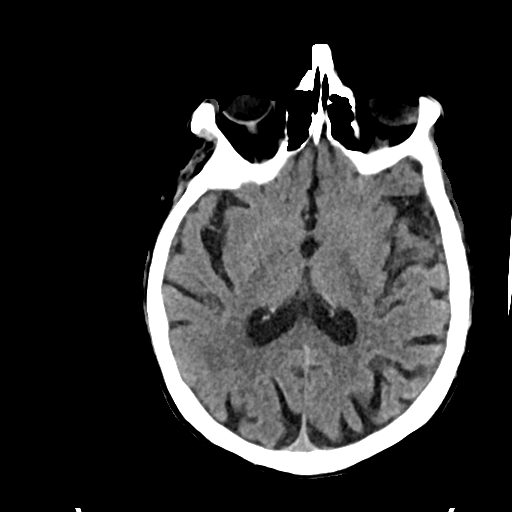
[im 22/35  brain]
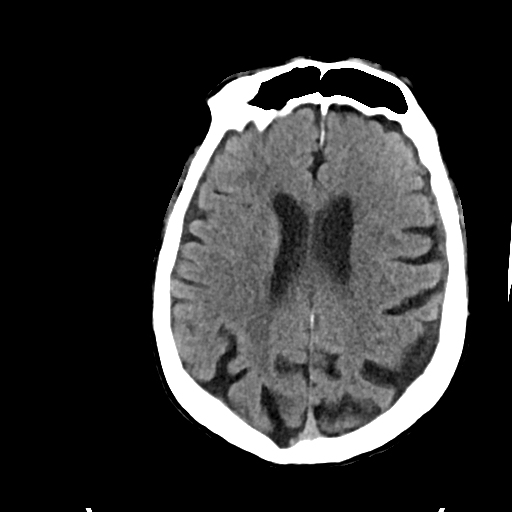
[im 22/35  bone]
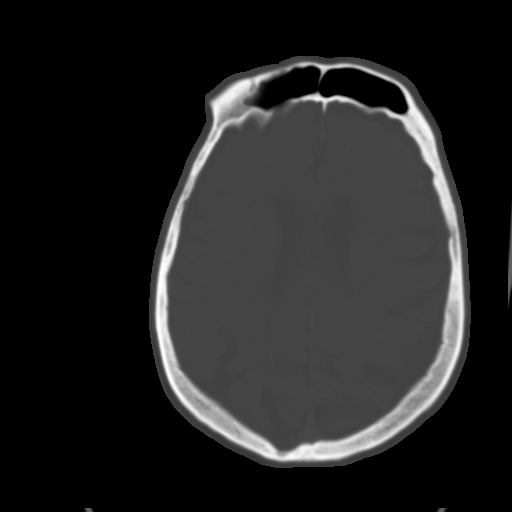
[im 26/35  brain]
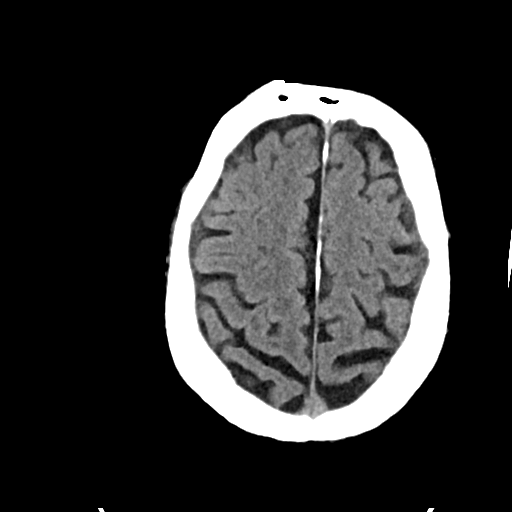
[im 30/35  brain]
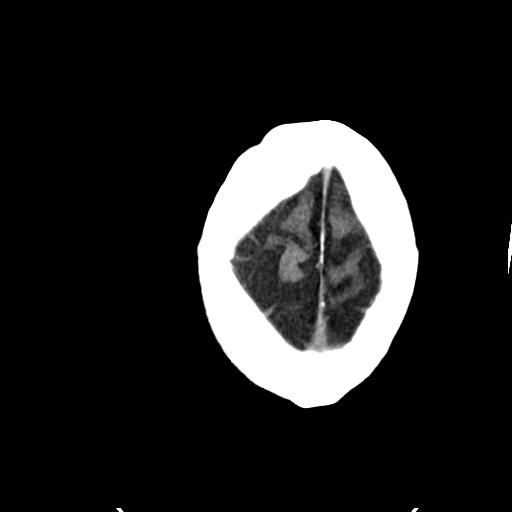

[Series 4: head bone · axial · 0.47mm/px · z∈[-164,-130]mm · 3 of 86 slices shown]
[im 9/86  bone]
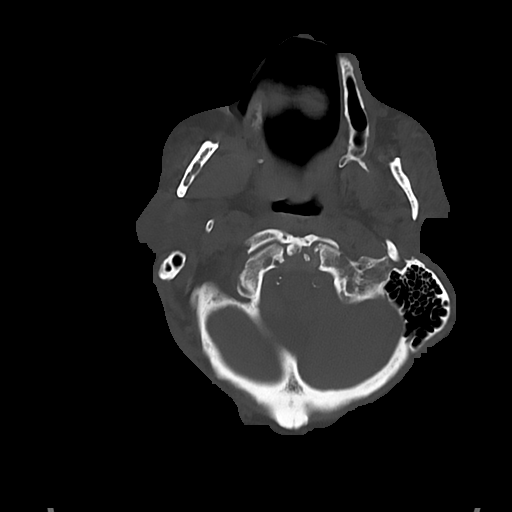
[im 18/86  bone]
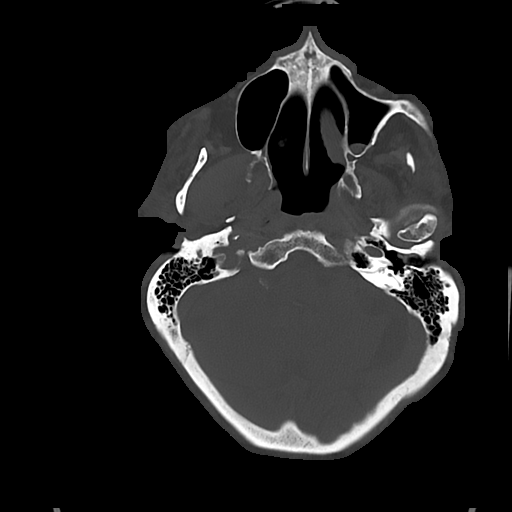
[im 26/86  bone]
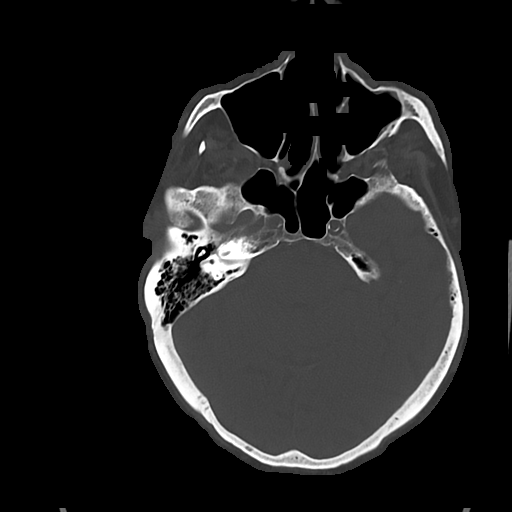

[Series 5: cor soft · coronal · 0.38mm/px · 3 of 76 slices shown]
[im 27/76  brain]
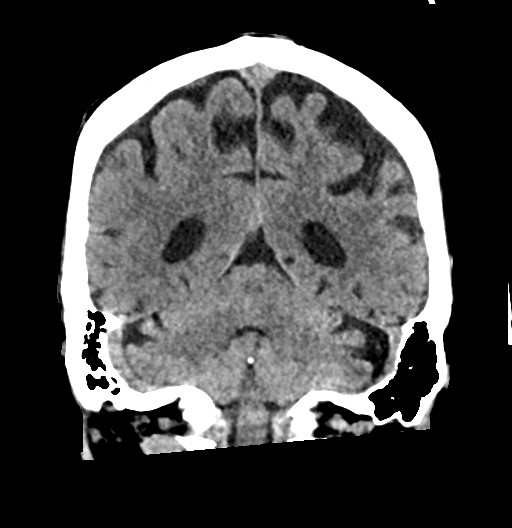
[im 34/76  brain]
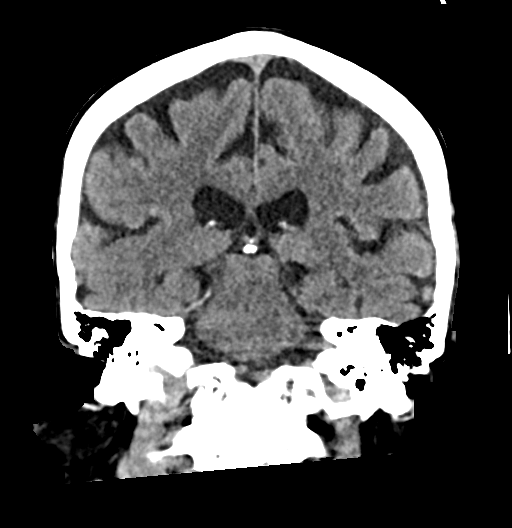
[im 42/76  brain]
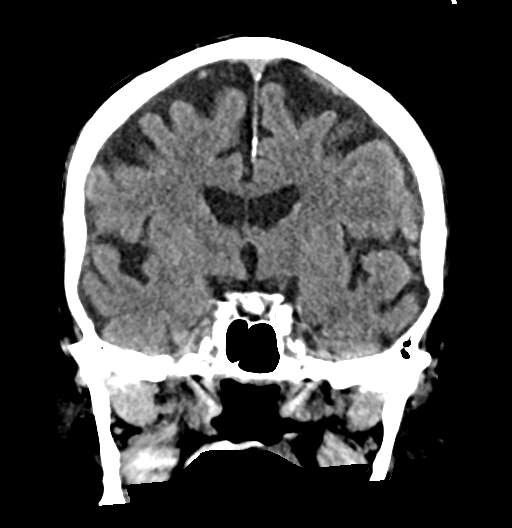

[Series 6: sag soft · sagittal · 0.39mm/px · 3 of 60 slices shown]
[im 20/60  brain]
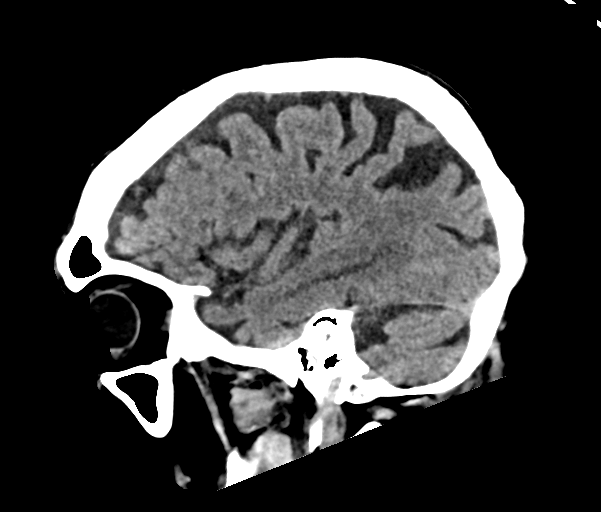
[im 30/60  brain]
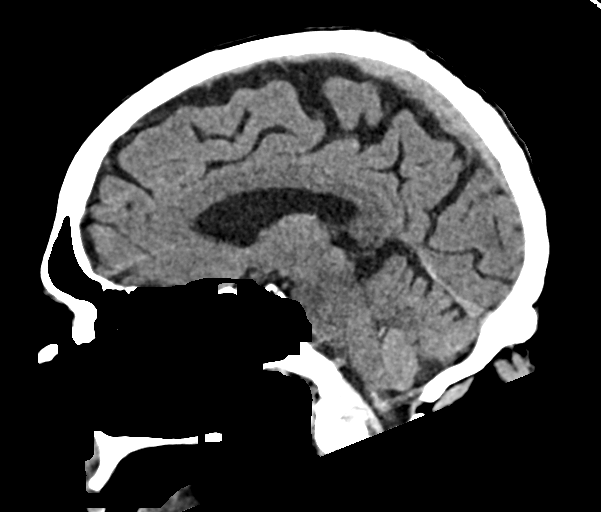
[im 40/60  brain]
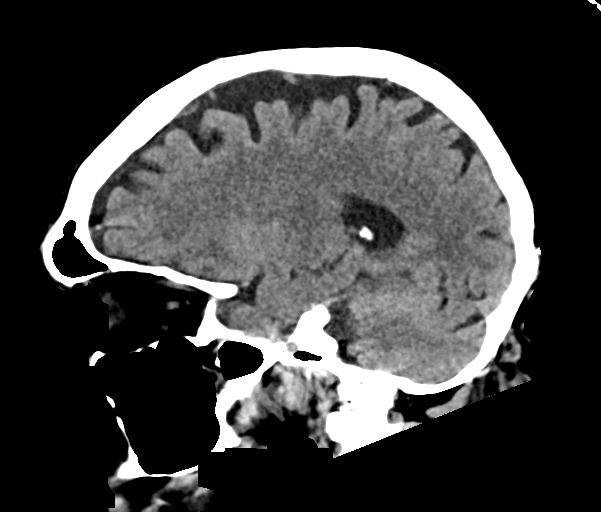

[16 of 47 positions shown; findings below may reference images not displayed]

FINDINGS: Brain: Bilateral mixed density subdural collections measuring up to
approximately 5 mm on the right and 6 mm on the left. There is some
amorphous hyperdensity within the left greater than right collection
(for example series 5, image 31) which is suggestive of acute/recent
hemorrhage. No substantial mass effect due to the patient's
underlying cerebral volume loss. No midline shift. Basal cisterns
are patent. No evidence of acute intraparenchymal hemorrhage. No
evidence of acute large vascular territory infarct. No mass lesion.
No hydrocephalus. Patchy white matter hypoattenuation, nonspecific
but most likely related to chronic microvascular ischemic disease.

Vascular: No hyperdense vessel identified. Calcific atherosclerosis.

Skull: No acute fracture.

Sinuses/Orbits: Small retention cyst in the inferior left maxillary
sinus. Otherwise, no substantial paranasal sinus disease.
Unremarkable orbits.

Other: No mastoid effusions.
IMPRESSION: Bilateral subdural collections along the frontal convexities (5 mm
in thickness on the right and 6 mm on the left), most likely chronic
subdural hematomas and/or hygromas. Amorphous hyperdensity within
the left greater than right collections is suggestive of
acute/recent hemorrhage within the collections. No substantial mass
effect due to the patient's underlying cerebral volume loss. No
midline shift.
